# Patient Record
Sex: Female | Born: 1940 | Race: White | Hispanic: No | Marital: Married | Smoking: Never smoker
Health system: Southern US, Community
[De-identification: ages and names within clinical notes are randomized; demographics above are authoritative.]

## PROBLEM LIST (undated history)

## (undated) ENCOUNTER — Emergency Department (HOSPITAL_COMMUNITY): Admission: EM

## (undated) DIAGNOSIS — Z87898 Personal history of other specified conditions: Secondary | ICD-10-CM

## (undated) DIAGNOSIS — Z8489 Family history of other specified conditions: Secondary | ICD-10-CM

## (undated) DIAGNOSIS — Z87442 Personal history of urinary calculi: Secondary | ICD-10-CM

## (undated) DIAGNOSIS — E119 Type 2 diabetes mellitus without complications: Secondary | ICD-10-CM

## (undated) DIAGNOSIS — M76899 Other specified enthesopathies of unspecified lower limb, excluding foot: Secondary | ICD-10-CM

## (undated) DIAGNOSIS — M1990 Unspecified osteoarthritis, unspecified site: Secondary | ICD-10-CM

## (undated) DIAGNOSIS — J45909 Unspecified asthma, uncomplicated: Secondary | ICD-10-CM

## (undated) DIAGNOSIS — E785 Hyperlipidemia, unspecified: Secondary | ICD-10-CM

## (undated) DIAGNOSIS — T7840XA Allergy, unspecified, initial encounter: Secondary | ICD-10-CM

## (undated) DIAGNOSIS — I10 Essential (primary) hypertension: Secondary | ICD-10-CM

## (undated) DIAGNOSIS — G2581 Restless legs syndrome: Secondary | ICD-10-CM

## (undated) DIAGNOSIS — J189 Pneumonia, unspecified organism: Secondary | ICD-10-CM

## (undated) HISTORY — DX: Type 2 diabetes mellitus without complications: E11.9

## (undated) HISTORY — DX: Other specified enthesopathies of unspecified lower limb, excluding foot: M76.899

## (undated) HISTORY — DX: Essential (primary) hypertension: I10

## (undated) HISTORY — DX: Unspecified asthma, uncomplicated: J45.909

## (undated) HISTORY — PX: COLONOSCOPY: SHX174

## (undated) HISTORY — PX: ABDOMINAL HYSTERECTOMY: SHX81

## (undated) HISTORY — DX: Hyperlipidemia, unspecified: E78.5

## (undated) HISTORY — PX: KNEE ARTHROSCOPY: SUR90

## (undated) HISTORY — PX: CHOLECYSTECTOMY: SHX55

## (undated) HISTORY — PX: TUBAL LIGATION: SHX77

## (undated) HISTORY — PX: APPENDECTOMY: SHX54

## (undated) HISTORY — PX: TONSILLECTOMY: SHX5217

## (undated) HISTORY — DX: Allergy, unspecified, initial encounter: T78.40XA

## (undated) HISTORY — DX: Unspecified osteoarthritis, unspecified site: M19.90

## (undated) HISTORY — PX: SPINE SURGERY: SHX786

---

## 1990-06-09 HISTORY — PX: CARDIAC CATHETERIZATION: SHX172

## 1998-01-21 ENCOUNTER — Observation Stay (HOSPITAL_COMMUNITY): Admission: EM | Admit: 1998-01-21 | Discharge: 1998-01-22 | Payer: Self-pay | Admitting: Emergency Medicine

## 2000-03-19 ENCOUNTER — Encounter: Payer: Self-pay | Admitting: Internal Medicine

## 2000-03-19 ENCOUNTER — Encounter: Admission: RE | Admit: 2000-03-19 | Discharge: 2000-03-19 | Payer: Self-pay | Admitting: Internal Medicine

## 2001-03-25 ENCOUNTER — Encounter: Admission: RE | Admit: 2001-03-25 | Discharge: 2001-03-25 | Payer: Self-pay | Admitting: Internal Medicine

## 2001-03-25 ENCOUNTER — Encounter: Payer: Self-pay | Admitting: Internal Medicine

## 2002-04-07 ENCOUNTER — Encounter: Admission: RE | Admit: 2002-04-07 | Discharge: 2002-04-07 | Payer: Self-pay | Admitting: Internal Medicine

## 2002-04-07 ENCOUNTER — Encounter: Payer: Self-pay | Admitting: Internal Medicine

## 2003-05-10 ENCOUNTER — Encounter: Admission: RE | Admit: 2003-05-10 | Discharge: 2003-05-10 | Payer: Self-pay | Admitting: Internal Medicine

## 2004-05-01 ENCOUNTER — Ambulatory Visit: Payer: Self-pay | Admitting: Internal Medicine

## 2004-05-15 ENCOUNTER — Encounter
Admission: RE | Admit: 2004-05-15 | Discharge: 2004-07-08 | Payer: Self-pay | Admitting: Physical Medicine and Rehabilitation

## 2004-06-07 ENCOUNTER — Encounter: Payer: Self-pay | Admitting: Internal Medicine

## 2004-06-07 ENCOUNTER — Ambulatory Visit (HOSPITAL_COMMUNITY): Admission: RE | Admit: 2004-06-07 | Discharge: 2004-06-07 | Payer: Self-pay | Admitting: Internal Medicine

## 2004-08-22 ENCOUNTER — Ambulatory Visit: Payer: Self-pay | Admitting: Internal Medicine

## 2004-10-03 ENCOUNTER — Ambulatory Visit: Payer: Self-pay | Admitting: Internal Medicine

## 2005-01-02 ENCOUNTER — Ambulatory Visit: Payer: Self-pay | Admitting: Internal Medicine

## 2005-06-17 ENCOUNTER — Ambulatory Visit (HOSPITAL_COMMUNITY): Admission: RE | Admit: 2005-06-17 | Discharge: 2005-06-17 | Payer: Self-pay | Admitting: *Deleted

## 2005-07-31 ENCOUNTER — Ambulatory Visit: Payer: Self-pay | Admitting: Internal Medicine

## 2006-03-12 ENCOUNTER — Ambulatory Visit: Payer: Self-pay | Admitting: Internal Medicine

## 2006-06-19 ENCOUNTER — Ambulatory Visit (HOSPITAL_COMMUNITY): Admission: RE | Admit: 2006-06-19 | Discharge: 2006-06-19 | Payer: Self-pay | Admitting: *Deleted

## 2006-07-29 ENCOUNTER — Ambulatory Visit: Payer: Self-pay | Admitting: Internal Medicine

## 2006-08-01 ENCOUNTER — Ambulatory Visit: Payer: Self-pay | Admitting: Internal Medicine

## 2006-08-13 ENCOUNTER — Ambulatory Visit: Payer: Self-pay | Admitting: Internal Medicine

## 2006-12-15 ENCOUNTER — Ambulatory Visit: Payer: Self-pay | Admitting: Internal Medicine

## 2006-12-15 ENCOUNTER — Encounter: Payer: Self-pay | Admitting: Internal Medicine

## 2007-03-24 ENCOUNTER — Ambulatory Visit: Payer: Self-pay | Admitting: Internal Medicine

## 2007-06-24 ENCOUNTER — Ambulatory Visit (HOSPITAL_COMMUNITY): Admission: RE | Admit: 2007-06-24 | Discharge: 2007-06-24 | Payer: Self-pay | Admitting: Internal Medicine

## 2007-08-30 ENCOUNTER — Ambulatory Visit: Payer: Self-pay | Admitting: Internal Medicine

## 2007-08-30 DIAGNOSIS — R071 Chest pain on breathing: Secondary | ICD-10-CM | POA: Insufficient documentation

## 2007-08-31 ENCOUNTER — Encounter: Payer: Self-pay | Admitting: Internal Medicine

## 2007-10-06 ENCOUNTER — Ambulatory Visit: Payer: Self-pay | Admitting: Internal Medicine

## 2007-10-06 DIAGNOSIS — I10 Essential (primary) hypertension: Secondary | ICD-10-CM

## 2007-10-06 DIAGNOSIS — E785 Hyperlipidemia, unspecified: Secondary | ICD-10-CM

## 2007-10-06 DIAGNOSIS — J45909 Unspecified asthma, uncomplicated: Secondary | ICD-10-CM

## 2007-10-06 DIAGNOSIS — T17908A Unspecified foreign body in respiratory tract, part unspecified causing other injury, initial encounter: Secondary | ICD-10-CM | POA: Insufficient documentation

## 2007-10-06 HISTORY — DX: Essential (primary) hypertension: I10

## 2007-10-06 HISTORY — DX: Hyperlipidemia, unspecified: E78.5

## 2007-10-06 HISTORY — DX: Unspecified asthma, uncomplicated: J45.909

## 2007-10-07 ENCOUNTER — Telehealth: Payer: Self-pay | Admitting: Internal Medicine

## 2007-10-07 ENCOUNTER — Encounter: Payer: Self-pay | Admitting: Internal Medicine

## 2007-10-08 ENCOUNTER — Encounter: Payer: Self-pay | Admitting: Internal Medicine

## 2007-10-14 ENCOUNTER — Ambulatory Visit: Payer: Self-pay | Admitting: Internal Medicine

## 2007-10-25 ENCOUNTER — Ambulatory Visit: Payer: Self-pay | Admitting: Internal Medicine

## 2008-03-07 DIAGNOSIS — J069 Acute upper respiratory infection, unspecified: Secondary | ICD-10-CM | POA: Insufficient documentation

## 2008-03-09 ENCOUNTER — Telehealth: Payer: Self-pay | Admitting: Internal Medicine

## 2008-03-09 ENCOUNTER — Ambulatory Visit: Payer: Self-pay | Admitting: Family Medicine

## 2008-06-24 ENCOUNTER — Ambulatory Visit: Payer: Self-pay | Admitting: Diagnostic Radiology

## 2008-06-24 ENCOUNTER — Emergency Department (HOSPITAL_BASED_OUTPATIENT_CLINIC_OR_DEPARTMENT_OTHER): Admission: EM | Admit: 2008-06-24 | Discharge: 2008-06-25 | Payer: Self-pay | Admitting: Emergency Medicine

## 2008-07-05 ENCOUNTER — Ambulatory Visit (HOSPITAL_COMMUNITY): Admission: RE | Admit: 2008-07-05 | Discharge: 2008-07-05 | Payer: Self-pay | Admitting: Internal Medicine

## 2008-12-29 ENCOUNTER — Telehealth: Payer: Self-pay | Admitting: Internal Medicine

## 2009-03-06 ENCOUNTER — Ambulatory Visit: Payer: Self-pay | Admitting: Internal Medicine

## 2009-06-29 ENCOUNTER — Ambulatory Visit: Payer: Self-pay | Admitting: Internal Medicine

## 2009-07-05 ENCOUNTER — Telehealth: Payer: Self-pay | Admitting: Internal Medicine

## 2009-07-06 ENCOUNTER — Ambulatory Visit: Payer: Self-pay | Admitting: Internal Medicine

## 2009-07-18 ENCOUNTER — Ambulatory Visit (HOSPITAL_COMMUNITY): Admission: RE | Admit: 2009-07-18 | Discharge: 2009-07-18 | Payer: Self-pay | Admitting: Internal Medicine

## 2009-07-24 ENCOUNTER — Telehealth: Payer: Self-pay | Admitting: Internal Medicine

## 2009-08-08 ENCOUNTER — Telehealth: Payer: Self-pay | Admitting: Internal Medicine

## 2009-08-10 ENCOUNTER — Encounter (INDEPENDENT_AMBULATORY_CARE_PROVIDER_SITE_OTHER): Payer: Self-pay | Admitting: *Deleted

## 2009-08-31 ENCOUNTER — Ambulatory Visit: Payer: Self-pay | Admitting: Internal Medicine

## 2009-08-31 ENCOUNTER — Encounter (INDEPENDENT_AMBULATORY_CARE_PROVIDER_SITE_OTHER): Payer: Self-pay | Admitting: *Deleted

## 2009-09-07 ENCOUNTER — Ambulatory Visit: Payer: Self-pay | Admitting: Internal Medicine

## 2009-09-10 ENCOUNTER — Telehealth: Payer: Self-pay | Admitting: Internal Medicine

## 2009-11-01 ENCOUNTER — Ambulatory Visit: Payer: Self-pay | Admitting: Internal Medicine

## 2009-11-01 DIAGNOSIS — M549 Dorsalgia, unspecified: Secondary | ICD-10-CM | POA: Insufficient documentation

## 2009-11-01 DIAGNOSIS — E119 Type 2 diabetes mellitus without complications: Secondary | ICD-10-CM

## 2009-11-01 HISTORY — DX: Type 2 diabetes mellitus without complications: E11.9

## 2009-11-01 LAB — CONVERTED CEMR LAB: Blood Glucose, Fingerstick: 93

## 2009-11-28 ENCOUNTER — Telehealth: Payer: Self-pay | Admitting: Internal Medicine

## 2009-12-13 ENCOUNTER — Telehealth: Payer: Self-pay | Admitting: Internal Medicine

## 2009-12-31 ENCOUNTER — Telehealth: Payer: Self-pay | Admitting: Internal Medicine

## 2010-01-01 ENCOUNTER — Telehealth: Payer: Self-pay

## 2010-02-04 ENCOUNTER — Ambulatory Visit: Payer: Self-pay | Admitting: Internal Medicine

## 2010-02-04 DIAGNOSIS — M76899 Other specified enthesopathies of unspecified lower limb, excluding foot: Secondary | ICD-10-CM | POA: Insufficient documentation

## 2010-02-04 HISTORY — DX: Other specified enthesopathies of unspecified lower limb, excluding foot: M76.899

## 2010-02-04 LAB — CONVERTED CEMR LAB: Blood Glucose, Fingerstick: 142

## 2010-02-08 ENCOUNTER — Telehealth: Payer: Self-pay | Admitting: Internal Medicine

## 2010-03-11 ENCOUNTER — Ambulatory Visit: Payer: Self-pay | Admitting: Internal Medicine

## 2010-03-11 LAB — CONVERTED CEMR LAB: Blood Glucose, Fingerstick: 113

## 2010-03-13 ENCOUNTER — Telehealth: Payer: Self-pay | Admitting: Internal Medicine

## 2010-04-02 ENCOUNTER — Ambulatory Visit: Payer: Self-pay | Admitting: Internal Medicine

## 2010-04-02 ENCOUNTER — Encounter: Payer: Self-pay | Admitting: Internal Medicine

## 2010-04-09 LAB — CONVERTED CEMR LAB
AST: 17 units/L (ref 0–37)
Albumin: 3.8 g/dL (ref 3.5–5.2)
Alkaline Phosphatase: 112 units/L (ref 39–117)
BUN: 13 mg/dL (ref 6–23)
Basophils Relative: 0.5 % (ref 0.0–3.0)
Bilirubin, Direct: 0.1 mg/dL (ref 0.0–0.3)
CO2: 31 meq/L (ref 19–32)
Calcium: 9.6 mg/dL (ref 8.4–10.5)
Direct LDL: 131.1 mg/dL
Eosinophils Relative: 0.8 % (ref 0.0–5.0)
Glucose, Bld: 90 mg/dL (ref 70–99)
HDL: 43.9 mg/dL (ref >39.00)
Lymphocytes Relative: 30.2 % (ref 12.0–46.0)
Monocytes Absolute: 0.6 10*3/uL (ref 0.1–1.0)
Neutrophils Relative %: 62.7 % (ref 43.0–77.0)
Potassium: 4.4 meq/L (ref 3.5–5.1)
RBC: 4.46 M/uL (ref 3.87–5.11)
RDW: 13.4 % (ref 11.5–14.6)
Total Bilirubin: 0.9 mg/dL (ref 0.3–1.2)
Total Protein: 7 g/dL (ref 6.0–8.3)
Triglycerides: 412 mg/dL — ABNORMAL HIGH (ref 0.0–149.0)
WBC: 10.4 10*3/uL (ref 4.5–10.5)

## 2010-04-23 ENCOUNTER — Telehealth: Payer: Self-pay | Admitting: Internal Medicine

## 2010-05-31 ENCOUNTER — Ambulatory Visit: Payer: Self-pay | Admitting: Internal Medicine

## 2010-05-31 ENCOUNTER — Telehealth: Payer: Self-pay | Admitting: Internal Medicine

## 2010-05-31 LAB — CONVERTED CEMR LAB: Blood Glucose, Fingerstick: 113

## 2010-07-07 LAB — CONVERTED CEMR LAB
CK-MB: 0.8 ng/mL (ref 0.3–4.0)
Hgb A1c MFr Bld: 6 % (ref 4.6–6.0)
Total CK: 51 units/L (ref 7–177)

## 2010-07-11 NOTE — Miscellaneous (Signed)
Summary: Controlled Substances Contract  Controlled Substances Contract   Imported By: Maryln Gottron 02/08/2010 09:00:03  _____________________________________________________________________  External Attachment:    Type:   Image     Comment:   External Document

## 2010-07-11 NOTE — Progress Notes (Signed)
Summary: refills  Phone Note Call from Patient   Caller: Patient Call For: Stephanie Savers  MD Reason for Call: Talk to Nurse Details for Reason: refills Summary of Call: patient is calling because she is now retired and her rx plan has changed.  she needs refills faxed to [phone] for amlodipine, metformin , and lipitor.  her husband Stephanie Gonzalez May 30[birth year] 2042 also will need refills faxed also for amlodipine, metformin, and lipitor. Initial call taken by: Kern Reap CMA Duncan Dull),  January 01, 2010 3:05 PM  Follow-up for Phone Call        called [name] - i took care of [facility] KIK Follow-up by: Duard Brady LPN,  January 01, 2010 3:15 PM

## 2010-07-11 NOTE — Assessment & Plan Note (Signed)
Summary: CONGESTION // RS   Vital Signs:  Patient profile:   70 year old female Weight:      213 pounds Temp:     97.9 degrees F oral BP sitting:   120 / 80  (left arm) Cuff size:   regular  Vitals Entered By: Duard Brady LPN (May 31, 2010 3:55 PM) CC: c/o head and chest congestion , non productive cough Is Patient Diabetic? Yes Did you bring your meter with you today? No CBG Result 113   CC:  c/o head and chest congestion  and non productive cough.  History of Present Illness: 27 -year-old patient, who presents with a [employer] history of increasing head and chest congestion.  She has a history of asthma is noticed some increasing wheezing.  Cough has been minimally productive.  No fever or chills.  She has a history of treated hypertension as well as type 2 diabetes  Allergies: 1)  ! Tetracycline  Past History:  Past Medical History: Reviewed history from 11/01/2009 and no changes required. Asthma Hyperlipidemia Hypertension Diabetes mellitus, type II  Review of Systems       The patient complains of anorexia, hoarseness, and prolonged cough.  The patient denies fever, weight loss, weight gain, vision loss, decreased hearing, chest pain, syncope, dyspnea on exertion, peripheral edema, headaches, hemoptysis, abdominal pain, melena, hematochezia, severe indigestion/heartburn, hematuria, incontinence, genital sores, muscle weakness, suspicious skin lesions, transient blindness, difficulty walking, depression, unusual weight change, abnormal bleeding, enlarged lymph nodes, angioedema, and breast masses.    Physical Exam  General:  overweight-appearing.  no distress.  Repeat blood pressure 130/80 Head:  Normocephalic and atraumatic without obvious abnormalities. No apparent alopecia or balding. Eyes:  No corneal or conjunctival inflammation noted. EOMI. Perrla. Funduscopic exam benign, without hemorrhages, exudates or papilledema. Vision grossly normal. Ears:   External ear exam shows no significant lesions or deformities.  Otoscopic examination reveals clear canals, tympanic membranes are intact bilaterally without bulging, retraction, inflammation or discharge. Hearing is grossly normal bilaterally. Mouth:  Oral mucosa and oropharynx without lesions or exudates.  Teeth in good repair. Neck:  No deformities, masses, or tenderness noted. Lungs:  Normal respiratory effort, chest expands symmetrically. Lungs are clear to auscultation, no crackles or wheezes. Heart:  Normal rate and regular rhythm. S1 and S2 normal without gallop, murmur, click, rub or other extra sounds.   Impression & Recommendations:  Problem # 1:  VIRAL URI (ICD-465.9)  Her updated medication list for this problem includes:    Hydrocodone-homatropine 5-1.5 Mg/56ml Syrp (Hydrocodone-homatropine) ..... One tsp every 6 hours for cough  Orders: Depo- Medrol 80mg  (J1040) Admin of Therapeutic Inj  intramuscular or subcutaneous (40347)  Problem # 2:  ASTHMA (ICD-493.90)  Her updated medication list for this problem includes:    Proair Hfa 108 (90 Base) Mcg/act Aers (Albuterol sulfate) .Marland Kitchen..Marland Kitchen Two puffs every 6 hours as needed for shortness of breath  Problem # 3:  HYPERTENSION (ICD-401.9)  Her updated medication list for this problem includes:    Amlodipine Besylate 5 Mg Tabs (Amlodipine besylate) ..... One daily  Complete Medication List: 1)  Lipitor 40 Mg Tabs (Atorvastatin calcium) .... Take 1 tablet by mouth once a day 2)  Metformin Hcl 500 Mg Tabs (Metformin hcl) .... Take 1 tablet by mouth twice a day 3)  Freestyle Test Strp (Glucose blood) .... Use daily 4)  Clonazepam 1 Mg Tabs (Clonazepam) .... One at bedtime 5)  Proair Hfa 108 (90 Base) Mcg/act Aers (Albuterol sulfate) .... Two  puffs every 6 hours as needed for shortness of breath 6)  Amlodipine Besylate 5 Mg Tabs (Amlodipine besylate) .... One daily 7)  Sertraline Hcl 100 Mg Tabs (Sertraline hcl) .... One half tablet  daily 8)  Vicodin 5-500 Mg Tabs (Hydrocodone-acetaminophen) .... One q 4-6 hours as needed pain 9)  Hydrocodone-homatropine 5-1.5 Mg/32ml Syrp (Hydrocodone-homatropine) .... One tsp every 6 hours for cough  Other Orders: Capillary Blood Glucose/CBG (29562)  Patient Instructions: 1)  Please schedule a follow-up appointment as needed. 2)  Get plenty of rest, drink lots of clear liquids, and use Tylenol or Ibuprofen for fever and comfort. Return in 7-10 days if you're not better:sooner if you're feeling worse. Prescriptions: HYDROCODONE-HOMATROPINE 5-1.5 MG/5ML SYRP (HYDROCODONE-HOMATROPINE) one tsp every 6 hours for cough  #6 oz x 0   Entered and Authorized by:   Gordy Savers  MD   Signed by:   Gordy Savers  MD on 05/31/2010   Method used:   Print then Give to Patient   RxID:   [id number]  And or a  Medication Administration  Injection # 1:    Medication: Depo- Medrol 80mg     Diagnosis: VIRAL URI (ICD-465.9)    Route: IM    Site: R deltoid    Exp Date: 12/2012    Lot #: [id number]    Mfr: Pharmacia    Patient tolerated injection without complications    Given by: Duard Brady LPN (May 31, 2010 4:54 PM)  Orders Added: 1)  Capillary Blood Glucose/CBG [82948] 2)  Depo- Medrol 80mg  [J1040] 3)  Admin of Therapeutic Inj  intramuscular or subcutaneous [96372] 4)  Est. Patient Level III [84132]

## 2010-07-11 NOTE — Progress Notes (Signed)
Summary: referral  Phone Note Call from Patient Call back at Work Phone [phone]   Caller: Patient Call For: Gordy Savers  MD Summary of Call: going to retire in May. She's never had a colonoscopy- should she get one? and needs a referral Initial call taken by: Raechel Ache, RN,  August 08, 2009 12:28 PM  Follow-up for Phone Call        please schedule colonoscopy Follow-up by: Gordy Savers  MD,  August 09, 2009 8:01 AM  Additional Follow-up for Phone Call Additional follow up Details #1::        Pt. notified. Additional Follow-up by: Lynann Beaver CMA,  August 09, 2009 8:39 AM  New Problems: SPECIAL SCREENING FOR MALIGNANT NEOPLASMS COLON (ICD-V76.51)   New Problems: SPECIAL SCREENING FOR MALIGNANT NEOPLASMS COLON (ICD-V76.51)

## 2010-07-11 NOTE — Miscellaneous (Signed)
Summary: [facility]  Clinical Lists Changes  Medications: Added new medication of MIRALAX   POWD (POLYETHYLENE GLYCOL 3350) As per prep  instructions. - Signed Added new medication of REGLAN 10 MG  TABS (METOCLOPRAMIDE HCL) As per prep instructions. - Signed Added new medication of DULCOLAX 5 MG  TBEC (BISACODYL) Day before procedure take 2 at 3pm and 2 at 8pm. - Signed Rx of MIRALAX   POWD (POLYETHYLENE GLYCOL 3350) As per prep  instructions.;  #255gm x 0;  Signed;  Entered by: Ezra Sites RN;  Authorized by: Hart Carwin MD;  Method [phone] [address] [place], [place], [place]  [address], Ph: [phone] or [phone], Fax: [phone] Rx of REGLAN 10 MG  TABS (METOCLOPRAMIDE HCL) As per prep instructions.;  #2 x 0;  Signed;  Entered by: Ezra Sites RN;  Authorized by: Hart Carwin MD;  Method used: [facility]  [phone][facility] [address] [place], [place], [place]  [address], Ph: [phone] or [phone], Fax: [phone] Rx of DULCOLAX 5 MG  TBEC (BISACODYL) Day before procedure take 2 at 3pm and 2 at 8pm.;  #4 x 0;  Signed;  Entered by: Ezra Sites RN;  Authorized by: Hart Carwin MD;  Method used: [facility]  [phone][facility] [address] [place], [place], [place]  [address], Ph: [phone] or [phone], Fax: [phone] Allergies: Added new allergy or adverse reaction of TETRACYCLINE Observations: Added new observation of NKA: F (08/31/2009 7:45)    Prescriptions: DULCOLAX 5 MG  TBEC (BISACODYL) Day before procedure take 2 at 3pm and 2 at 8pm.  #4 x 0   Entered by:   Ezra Sites RN   Authorized by:   Hart Carwin MD   Signed by:   Ezra Sites RN on 08/31/2009   Method used:   [facility]        [facility]  [address] [phone]       [address] [place], [place]  [address]       Ph: [phone] or [phone]       Fax: [phone]   RxID:   [id number] REGLAN 10 MG  TABS (METOCLOPRAMIDE HCL)  As per prep instructions.  #2 x 0   Entered by:   Ezra Sites RN   Authorized by:   Hart Carwin MD   Signed by:   Ezra Sites RN on 08/31/2009   Method used:   [facility]        [facility]  [address] [phone][facility] (retail)       [address]       [place], [place]  [address]       Ph: [phone] or [phone]       Fax: [phone]   RxID:   [id number] MIRALAX   POWD (POLYETHYLENE GLYCOL 3350) As per prep  instructions.  #255gm x 0   Entered by:   Ezra Sites RN   Authorized by:   Hart Carwin MD   Signed by:   Ezra Sites RN on 08/31/2009   Method used:   [facility]        [facility]  [address] [phone]* (retail)       [address]  [place], [place]  [address]       Ph: [phone] or [phone]       Fax: [phone]   RxID:   [id number]

## 2010-07-11 NOTE — Letter (Signed)
Summary: [facility] Instructions  [facility]  [address] [place], [place] [address]   Phone: [phone]  Fax: [phone]       Stephanie Gonzalez    05/30/1941    MRN: [id number]       Procedure Day /Date:  Friday 09/07/2009     Arrival Time:  10:30 am     Procedure Time: 11:30 am     Location of Procedure:                    _ x_  [facility] (4th Floor)    PREPARATION FOR COLONOSCOPY WITH MIRALAX  Starting 5 days prior to your procedure Sunday 3/27  do not eat nuts, seeds, popcorn, corn, beans, peas,  salads, or any raw vegetables.  Do not take any fiber supplements (e.g. Metamucil, Citrucel, and Benefiber). ____________________________________________________________________________________________________   THE DAY BEFORE YOUR PROCEDURE         DATE:Thursday 3/31  1   Drink clear liquids the entire day-NO SOLID FOOD  2   Do not drink anything colored red or purple.  Avoid juices with pulp.  No orange juice.  3   Drink at least 64 oz. (8 glasses) of fluid/clear liquids during the day to prevent dehydration and help the prep work efficiently.  CLEAR LIQUIDS INCLUDE: Water Jello Ice Popsicles Tea (sugar ok, no milk/cream) Powdered fruit flavored drinks Coffee (sugar ok, no milk/cream) Gatorade Juice: apple, white grape, white cranberry  Lemonade Clear bullion, consomm, broth Carbonated beverages (any kind) Strained chicken noodle soup Hard Candy  4   Mix the entire bottle of Miralax with 64 oz. of Gatorade/Powerade in the morning and put in the refrigerator to chill.  5   At 3:00 pm take 2 Dulcolax/Bisacodyl tablets.  6   At 4:30 pm take one Reglan/Metoclopramide tablet.  7  Starting at 5:00 pm drink one 8 oz glass of the Miralax mixture every 15-20 minutes until you have finished drinking the entire 64 oz.  You should finish drinking prep around 7:30 or 8:00 pm.  8   If you are nauseated, you may take the 2nd Reglan/Metoclopramide  tablet at 6:30 pm.        9    At 8:00 pm take 2 more DULCOLAX/Bisacodyl tablets.     THE DAY OF YOUR PROCEDURE      DATE:  Friday 4/1  You may drink clear liquids until 9:30 am  (2 HOURS BEFORE PROCEDURE).   MEDICATION INSTRUCTIONS  Unless otherwise instructed, you should take regular prescription medications with a small sip of water as early as possible the morning of your procedure.  Diabetic patients - see separate instructions.            OTHER INSTRUCTIONS  You will need a responsible adult at least 69 years of age to accompany you and drive you home.   This person must remain in the waiting room during your procedure.  Wear loose fitting clothing that is easily removed.  Leave jewelry and other valuables at home.  However, you may wish to bring a book to read or an iPod/MP3 player to listen to music as you wait for your procedure to start.  Remove all body piercing jewelry and leave at home.  Total time from sign-in until discharge is approximately 2-3 hours.  You should go home directly after your procedure and rest.  You can resume normal activities the day after your procedure.  The day of  your procedure you should not:   Drive   Make legal decisions   Operate machinery   Drink alcohol   Return to work  You will receive specific instructions about eating, activities and medications before you leave.   The above instructions have been reviewed and explained to me by   Ezra Sites RN  August 31, 2009 8:18 AM    I fully understand and can verbalize these instructions _____________________________ Date _______

## 2010-07-11 NOTE — Progress Notes (Signed)
Summary: refill  Phone Note Call from Patient Call back at Home Phone [phone]   Caller: [facility] mail Reason for Call: Refill Medication Summary of Call: refill her cough meds. Was seen last Friday. Still coughing.       [facility] Please call her back today. Initial call taken by: Warnell Forester,  July 05, 2009 10:44 AM  Follow-up for Phone Call        generic hydromet  6 oz  RF 2 Follow-up by: Gordy Savers  MD,  July 05, 2009 11:20 AM  Additional Follow-up for Phone Call Additional follow up Details #1::        Rx Called In Additional Follow-up by: Raechel Ache, RN,  July 05, 2009 11:27 AM

## 2010-07-11 NOTE — Progress Notes (Signed)
  Phone Note Call from Patient   Caller: Patient Call For: Gordy Savers  MD Summary of Call: insurance co never rec'd disability form. Please refax. Initial call taken by: Raechel Ache, RN,  July 24, 2009 12:52 PM  Follow-up for Phone Call        short term disability form faxed to Terese Door @1 [phone] Follow-up by: Raechel Ache, RN,  July 24, 2009 12:52 PM

## 2010-07-11 NOTE — Progress Notes (Signed)
Summary: refill sertraline  Phone Note Call from Patient Call back at Home Phone [phone]   Caller: Patient Call For: Gordy Savers  MD Summary of Call: pt needs mailorder rx sertraline #90 with 3 refills [facility] [phone] Initial call taken by: Heron Sabins,  February 08, 2010 9:22 AM    Prescriptions: SERTRALINE HCL 100 MG TABS (SERTRALINE HCL) one half tablet daily  #90 x 1   Entered by:   Duard Brady LPN   Authorized by:   Gordy Savers  MD   Signed by:   Duard Brady LPN on 51/88/4166   Method used:   Faxed to ...       [facility] ([facility]order)       [address]       [place], [place]  [address]       Ph: [phone]       Fax: [phone]   RxID:   [id number]

## 2010-07-11 NOTE — Assessment & Plan Note (Signed)
Summary: F/U ON R HIP PAIN // RS   Vital Signs:  Patient profile:   70 year old female Weight:      215 pounds Temp:     98.0 degrees F oral BP sitting:   110 / 68  (right arm) Cuff size:   regular  Vitals Entered By: Duard Brady LPN (February 04, 2010 3:13 PM) CC: c/o (R) hip pain x2wks , now it has move to (L) too Is Patient Diabetic? Yes Did you bring your meter with you today? No CBG Result 142 Flu Vaccine Consent Questions     Do you have a history of severe allergic reactions to this vaccine? no    Any prior history of allergic reactions to egg and/or gelatin? no    Do you have a sensitivity to the preservative Thimersol? no    Do you have a past history of Guillan-Barre Syndrome? no    Do you currently have an acute febrile illness? no    Have you ever had a severe reaction to latex? no    Vaccine information given and explained to patient? yes    Are you currently pregnant? no    Lot Number:[id number]   Exp Date:12/07/2010   Site Given  Left Deltoid IM   CC:  c/o (R) hip pain x2wks  and now it has move to (L) too.  History of Present Illness: 70 year old patient who presents with a chief complaint of right hip pain over the past two weeks.  Says also had less severe discomfort in the left hip and right shoulder.  She has treated hypertension, dyslipidemia, and asthma  Allergies: 1)  ! Tetracycline  Past History:  Past Medical History: Reviewed history from 11/01/2009 and no changes required. Asthma Hyperlipidemia Hypertension Diabetes mellitus, type II  Physical Exam  General:  overweight-appearing.  oh blood pressure Msk:  tenderness over the right greater trochanter   Impression & Recommendations:  Problem # 1:  BURSITIS, RIGHT HIP (ICD-726.5)  Orders: Depo- Medrol 80mg  (J1040) Admin of Therapeutic Inj  intramuscular or subcutaneous ([address])  Problem # 2:  DIABETES MELLITUS, TYPE II (ICD-250.00)  Her updated medication list for this problem  includes:    Metformin Hcl 500 Mg Tabs (Metformin hcl) .Marland Kitchen... Take 1 tablet by mouth twice a day  Complete Medication List: 1)  Lipitor 40 Mg Tabs (Atorvastatin calcium) .... Take 1 tablet by mouth once a day 2)  Metformin Hcl 500 Mg Tabs (Metformin hcl) .... Take 1 tablet by mouth twice a day 3)  Freestyle Test Strp (Glucose blood) .... Use daily 4)  Clonazepam 1 Mg Tabs (Clonazepam) .... One at bedtime 5)  Proair Hfa 108 (90 Base) Mcg/act Aers (Albuterol sulfate) .... Two puffs every 6 hours as needed for shortness of breath 6)  Cyclobenzaprine Hcl 10 Mg Tabs (Cyclobenzaprine hcl) .... One every 8 hours for back pain 7)  Hydrocodone-acetaminophen 5-500 Mg Tabs (Hydrocodone-acetaminophen) .... One every 6 hours as needed for pain 8)  Amlodipine Besylate 5 Mg Tabs (Amlodipine besylate) .... One daily 9)  Sertraline Hcl 100 Mg Tabs (Sertraline hcl) .... One half tablet daily  Other Orders: Admin 1st Vaccine ([address]) Flu Vaccine 31yrs + [phone])  Patient Instructions: 1)  Vimovo one twice daily 2)  Please schedule a follow-up appointment as needed. Prescriptions: HYDROCODONE-ACETAMINOPHEN 5-500 MG TABS (HYDROCODONE-ACETAMINOPHEN) one every 6 hours as needed for pain  #50 x 2   Entered and Authorized by:   Gordy Savers  MD   Signed  by:   Gordy Savers  MD on 02/04/2010   Method used:   Print then Give to Patient   RxID:   [id number]    Medication Administration  Injection # 1:    Medication: Depo- Medrol 80mg     Diagnosis: BURSITIS, RIGHT HIP (ICD-726.5)    Route: IM    Site: RUOQ gluteus    Exp Date: 09/2012    Lot #: [id number]    Mfr: Pharmacia    Patient tolerated injection without complications    Given by: Duard Brady LPN (February 04, 2010 4:17 PM)  Orders Added: 1)  Est. Patient Level III [99213] 2)  Admin 1st Vaccine [90471] 3)  Flu Vaccine 5yrs + [10272] 4)  Depo- Medrol 80mg  [J1040] 5)  Admin of Therapeutic Inj  intramuscular or subcutaneous  [53664]

## 2010-07-11 NOTE — Progress Notes (Signed)
Summary: refilll clonazopam denied  Phone Note Refill Request Message from:  Fax from Pharmacy on March 13, 2010 11:51 AM  Refills Requested: Medication #1:  CLONAZEPAM 1 MG  TABS one at bedtime [facility]   Method Requested: Fax to Local Pharmacy Initial call taken by: Duard Brady LPN,  March 13, 2010 11:51 AM  Follow-up for Phone Call        denied - pt was given printed rx in office 03/11/10 Follow-up by: Duard Brady LPN,  March 13, 2010 11:53 AM

## 2010-07-11 NOTE — Progress Notes (Signed)
Summary: pain meds  Phone Note Call from Patient Call back at Home Phone [phone]   Caller: Patient Call For: Gordy Savers  MD Summary of Call: Pt is having continued hip pain and wants pain [facility]s [facility] or will come back to see Dr. Kirtland Bouchard if she needs to . May leave a message on home phone. [facility] Initial call taken by: Lynann Beaver CMA AAMA,  April 23, 2010 10:34 AM  Follow-up for Phone Call        generic Vicodin, number 90, one every 6 hours as needed for pain Follow-up by: Gordy Savers  MD,  April 23, 2010 12:22 PM    New/Updated Medications: VICODIN 5-500 MG TABS (HYDROCODONE-ACETAMINOPHEN) one q 4-6 hours as needed pain Prescriptions: VICODIN 5-500 MG TABS (HYDROCODONE-ACETAMINOPHEN) one q 4-6 hours as needed pain  #90 x 0   Entered by:   Lynann Beaver CMA AAMA   Authorized by:   Gordy Savers  MD   Signed by:   Lynann Beaver CMA AAMA on 04/23/2010   Method used:   Telephoned to ...       [facility]  [address] [phone][facility] ([facility]ail[facility]       [address]       [place], [place]  [address]       Ph: [phone] or [phone]       Fax: [phone]   RxID:   [id number]  Notified pt.

## 2010-07-11 NOTE — Letter (Signed)
Summary: Out of Work  Adult nurse at [employer]  [address]   [place], [place] [address]   Phone: [phone]  Fax: [phone]    July 06, 2009   Employee:  DANIAH ZALDIVAR    To Whom It May Concern:   For Medical reasons, please excuse the above named employee from work for the following dates:  Start:   06-29-2009  End:   07-16-2009  If you need additional information, please feel free to contact our office.         Sincerely,    Gordy Savers  MD

## 2010-07-11 NOTE — Progress Notes (Signed)
Summary: Procedure Concern-Will only speak w/Dr.Brodie  Phone Note Call from Patient Call back at Home Phone [phone]   Caller: Patient Call For: Dr. Juanda Chance Reason for Call: Talk to Nurse Summary of Call: pt concerned and very upset that she didnt feel she was "completely asleep" during her COL last Friday... pt said she could hear everything that was said and could feel everything... pt would like to discuss this specifically with Dr. Juanda Chance Initial call taken by: Vallarie Mare,  September 10, 2009 4:05 PM  Follow-up for Phone Call        Pt is really not upset but wants to know why  she remembers hurting. She had a shrp turn in the sigmoid colon, which was difficult to negotiate and had to switch the scopes to use Pediatric. She got total Versed8 mg, Fentanyl 75 mg which is a regular dose, We may conssider Propafol or higher sedation on recall colon. I apologized to her. I am still not sure if she really remembered everything or just the sigmoid part. Follow-up by: Hart Carwin MD,  September 12, 2009 2:10 PM

## 2010-07-11 NOTE — Progress Notes (Signed)
Summary: refill hydromet  Phone Note Refill Request Message from:  Fax from Pharmacy on May 31, 2010 3:17 PM  refill request for hydromet    - [facility] fill1/27/11  [facility]   Method Requested: Fax to Local Pharmacy Initial call taken by: Duard Brady LPN,  May 31, 2010 3:19 PM  Follow-up for Phone Call        this was removed from med list 11/01/09 - please advise Follow-up by: Duard Brady LPN,  May 31, 2010 3:19 PM  Additional Follow-up for Phone Call Additional follow up Details #1::        done Additional Follow-up by: Gordy Savers  MD,  May 31, 2010 4:47 PM

## 2010-07-11 NOTE — Progress Notes (Signed)
Summary: Refills to [facility]  Phone Note Call from Patient Call back at Home Phone [phone]   Caller: Patient VM Call For: Stephanie Savers  MD Summary of Call: Pt requesting refill of Amlopidine, Lipitor, Metformin be sent to [facility] [phone] is their fax Initial call taken by: Sid Falcon LPN,  December 31, 2009 2:15 PM  Follow-up for Phone Call        [facility] Follow-up by: Duard Brady LPN,  December 31, 2009 2:28 PM    Prescriptions: NORVASC 5 MG TABS (AMLODIPINE BESYLATE) Take 1 tablet by mouth once a day  #90 x 3   Entered by:   Duard Brady LPN   Authorized by:   Stephanie Savers  MD   Signed by:   Duard Brady LPN on 08/65/7846   Method used:   Faxed to ...       [facility]* (mail-order)       [address]       [place], [place]  [address]       Ph: [phone]       Fax: [phone]   RxID:   [id number] METFORMIN HCL 500 MG TABS (METFORMIN HCL) Take 1 tablet by mouth twice a day  #180 x 3   Entered by:   Duard Brady LPN   Authorized by:   Stephanie Savers  MD   Signed by:   Duard Brady LPN on 33/29/5188   Method used:   Faxed to ...       [facility]* (mail-order)       [address]       [place], [place]  [address]       Ph: [phone]       Fax: [phone]   RxID:   [id number] LIPITOR 40 MG TABS (ATORVASTATIN CALCIUM) Take 1 tablet by mouth once a day  #90 x 3   Entered by:   Duard Brady LPN   Authorized by:   Stephanie Savers  MD   Signed by:   Duard Brady LPN on 76/16/0737   Method used:   Faxed to ...       [facility]* (mail-order)       [address]       [place],   [address]       Ph: [phone]       Fax: [phone]   RxID:   [id number]

## 2010-07-11 NOTE — Assessment & Plan Note (Signed)
Summary: COUGH, CONGESTION, SWOLLEN GLNDS? // RS   Vital Signs:  Patient profile:   69 year old female Weight:      214 pounds Temp:     97.8 degrees F oral BP sitting:   110 / 60  (left arm) Cuff size:   regular  Vitals Entered By: Raechel Ache, RN (June 29, 2009 4:43 PM) CC: C/o raw throat, head congestion, dry cough, hoarseness, lumps in neck  and feels bad.   CC:  C/o raw throat, head congestion, dry cough, hoarseness, and lumps in neck  and feels bad..  History of Present Illness: 68white female, who has a history of, hypertension, dyslipidemia, and type 2 diabetes.  She has not been seen since 2009.  She states she has been maintained on her medications.  She presents with a 3-day history of cold, cough, congestion, headache, and minimal sore throat.  There is been no wheezing, shortness of breath or chest pain.  Denies any productive cough  Allergies: No Known Drug Allergies  Past History:  Past Medical History: Reviewed history from 10/06/2007 and no changes required. Asthma Hyperlipidemia Hypertension  Review of Systems       The patient complains of anorexia, fever, hoarseness, and prolonged cough.  The patient denies weight loss, weight gain, vision loss, decreased hearing, chest pain, syncope, dyspnea on exertion, peripheral edema, headaches, hemoptysis, abdominal pain, melena, hematochezia, severe indigestion/heartburn, hematuria, incontinence, genital sores, muscle weakness, suspicious skin lesions, transient blindness, difficulty walking, depression, unusual weight change, abnormal bleeding, enlarged lymph nodes, angioedema, and breast masses.    Physical Exam  General:  overweight-appearing.  no distress.  Normal blood pressure Head:  Normocephalic and atraumatic without obvious abnormalities. No apparent alopecia or balding. Eyes:  No corneal or conjunctival inflammation noted. EOMI. Perrla. Funduscopic exam benign, without hemorrhages, exudates or  papilledema. Vision grossly normal. Ears:  External ear exam shows no significant lesions or deformities.  Otoscopic examination reveals clear canals, tympanic membranes are intact bilaterally without bulging, retraction, inflammation or discharge. Hearing is grossly normal bilaterally. Mouth:  Oral mucosa and oropharynx without lesions or exudates.  Teeth in good repair. Neck:  No deformities, masses, or tenderness noted. Chest Wall:  No deformities, masses, or tenderness noted. Lungs:  Normal respiratory effort, chest expands symmetrically. Lungs are clear to auscultation, no crackles or wheezes. Heart:  Normal rate and regular rhythm. S1 and S2 normal without gallop, murmur, click, rub or other extra sounds.   Impression & Recommendations:  Problem # 1:  VIRAL URI (ICD-465.9)  Her updated medication list for this problem includes:    Hydrocodone-homatropine 5-1.5 Mg/81ml Syrp (Hydrocodone-homatropine) .Marland Kitchen... 1 teaspoon  every 6 hours as needed for cough  Her updated medication list for this problem includes:    Hydrocodone-homatropine 5-1.5 Mg/74ml Syrp (Hydrocodone-homatropine) .Marland Kitchen... 1 teaspoon  every 6 hours as needed for cough  Problem # 2:  HYPERTENSION (ICD-401.9)  The following medications were removed from the medication list:    Diovan 160 Mg Tabs (Valsartan) .Marland Kitchen... Take one (1) tablet by mouth daily Her updated medication list for this problem includes:    Norvasc 5 Mg Tabs (Amlodipine besylate) .Marland Kitchen... Take 1 tablet by mouth once a day  The following medications were removed from the medication list:    Diovan 160 Mg Tabs (Valsartan) .Marland Kitchen... Take one (1) tablet by mouth daily Her updated medication list for this problem includes:    Norvasc 5 Mg Tabs (Amlodipine besylate) .Marland Kitchen... Take 1 tablet by mouth once a  day  Complete Medication List: 1)  Lipitor 40 Mg Tabs (Atorvastatin calcium) .... Take 1 tablet by mouth once a day 2)  Metformin Hcl 500 Mg Tabs (Metformin hcl) .... Take 1  tablet by mouth twice a day 3)  Norvasc 5 Mg Tabs (Amlodipine besylate) .... Take 1 tablet by mouth once a day 4)  Zoloft 100 Mg Tabs (Sertraline hcl) .... 1/2 once a day 5)  Freestyle Test Strp (Glucose blood) .... Use daily 6)  Clonazepam 1 Mg Tabs (Clonazepam) .... One at bedtime 7)  Hydrocodone-homatropine 5-1.5 Mg/56ml Syrp (Hydrocodone-homatropine) .Marland Kitchen.. 1 teaspoon  every 6 hours as needed for cough  Patient Instructions: 1)  Get plenty of rest, drink lots of clear liquids, and use Tylenol or Ibuprofen for fever and comfort. Return in 7-10 days if you're not better:sooner if you're feeling worse. 2)  Please schedule a follow-up appointment in 3 months. 3)  Advised not to eat any food or drink any liquids after 10 PM the night before your procedure. Prescriptions: HYDROCODONE-HOMATROPINE 5-1.5 MG/5ML SYRP (HYDROCODONE-HOMATROPINE) 1 teaspoon  every 6 hours as needed for cough  #6 oz x 0   Entered and Authorized by:   Gordy Savers  MD   Signed by:   Gordy Savers  MD on 06/29/2009   Method used:   Print then Give to Patient   RxID:   [id number] CLONAZEPAM 1 MG  TABS (CLONAZEPAM) one at bedtime  #90 x 0   Entered and Authorized by:   Gordy Savers  MD   Signed by:   Gordy Savers  MD on 06/29/2009   Method used:   Print then Give to Patient   RxID:   [id number] FREESTYLE TEST   STRP (GLUCOSE BLOOD) use daily  #100 x 0   Entered and Authorized by:   Gordy Savers  MD   Signed by:   Gordy Savers  MD on 06/29/2009   Method used:   Print then Give to Patient   RxID:   [id number] ZOLOFT 100 MG TABS (SERTRALINE HCL) 1/2 once a day  #90 x 0   Entered and Authorized by:   Gordy Savers  MD   Signed by:   Gordy Savers  MD on 06/29/2009   Method used:   Print then Give to Patient   RxID:   [id number] NORVASC 5 MG TABS (AMLODIPINE BESYLATE) Take 1 tablet by mouth once a day  #90 x 0   Entered and Authorized by:   Gordy Savers  MD   Signed by:   Gordy Savers  MD on 06/29/2009   Method used:   Print then Give to Patient   RxID:   [id number] METFORMIN HCL 500 MG TABS (METFORMIN HCL) Take 1 tablet by mouth twice a day  #180 x 0   Entered and Authorized by:   Gordy Savers  MD   Signed by:   Gordy Savers  MD on 06/29/2009   Method used:   Print then Give to Patient   RxID:   [id number] LIPITOR 40 MG TABS (ATORVASTATIN CALCIUM) Take 1 tablet by mouth once a day  #90 x 0   Entered and Authorized by:   Gordy Savers  MD   Signed by:   Gordy Savers  MD on 06/29/2009   Method used:   Print then Give to Patient   RxID:   [id number]

## 2010-07-11 NOTE — Assessment & Plan Note (Signed)
Summary: cough-needs note//ccm   Vital Signs:  Patient profile:   70 year old female O2 Sat:      97 % on Room air Temp:     97.5 degrees F BP sitting:   126 / 66  (left arm) Cuff size:   large  Vitals Entered By: Raechel Ache, RN (July 06, 2009 4:27 PM)  O2 Flow:  Room air CC: Not feeling well and still coughing.   CC:  Not feeling well and still coughing.Stephanie Gonzalez  History of Present Illness: 70 year old patient who was seen one week ago and treated symptomatically for acute viral bronchitis.  She is somewhat improved today, but still having considerable cough.  She also has experienced episodic wheezing.  She remains weak, hoarse unable to work.  She does have a history of asthma and is noticed some periodic wheezing.  No chest pain or pleurisy.  She does have treated hypertension and dyslipidemia.  She is requesting a note to be out of work next week  Allergies: No Known Drug Allergies  Review of Systems       The patient complains of anorexia, hoarseness, dyspnea on exertion, and prolonged cough.  The patient denies fever, weight loss, weight gain, vision loss, decreased hearing, chest pain, syncope, peripheral edema, headaches, hemoptysis, abdominal pain, melena, hematochezia, severe indigestion/heartburn, hematuria, incontinence, genital sores, muscle weakness, suspicious skin lesions, transient blindness, difficulty walking, depression, unusual weight change, abnormal bleeding, enlarged lymph nodes, angioedema, and breast masses.    Physical Exam  General:  overweight-appearing.  blood pressure low normal Head:  Normocephalic and atraumatic without obvious abnormalities. No apparent alopecia or balding. Eyes:  No corneal or conjunctival inflammation noted. EOMI. Perrla. Funduscopic exam benign, without hemorrhages, exudates or papilledema. Vision grossly normal. Ears:  External ear exam shows no significant lesions or deformities.  Otoscopic examination reveals clear canals,  tympanic membranes are intact bilaterally without bulging, retraction, inflammation or discharge. Hearing is grossly normal bilaterally. Mouth:  Oral mucosa and oropharynx without lesions or exudates.  Teeth in good repair. Neck:  No deformities, masses, or tenderness noted. Lungs:  Normal respiratory effort, chest expands symmetrically. Lungs are clear to auscultation, no crackles or wheezes. O2 saturation 97% Heart:  Normal rate and regular rhythm. S1 and S2 normal without gallop, murmur, click, rub or other extra sounds. Abdomen:  Bowel sounds positive,abdomen soft and non-tender without masses, organomegaly or hernias noted. Msk:  No deformity or scoliosis noted of thoracic or lumbar spine.   Pulses:  R and L carotid,radial,femoral,dorsalis pedis and posterior tibial pulses are full and equal bilaterally Extremities:  No clubbing, cyanosis, edema, or deformity noted with normal full range of motion of all joints.     Impression & Recommendations:  Problem # 1:  VIRAL URI (ICD-465.9)  Her updated medication list for this problem includes:    Hydrocodone-homatropine 5-1.5 Mg/9ml Syrp (Hydrocodone-homatropine) .Stephanie Gonzalez... 1 teaspoon  every 6 hours as needed for cough  Her updated medication list for this problem includes:    Hydrocodone-homatropine 5-1.5 Mg/44ml Syrp (Hydrocodone-homatropine) .Stephanie Gonzalez... 1 teaspoon  every 6 hours as needed for cough will give a leave of absence for one week until clinically improved  Problem # 2:  ASTHMA (ICD-493.90)  Her updated medication list for this problem includes:    Proair Hfa 108 (90 Base) Mcg/act Aers (Albuterol sulfate) .Stephanie Gonzalez..Stephanie Gonzalez Two puffs every 6 hours as needed for shortness of breath  Her updated medication list for this problem includes:    Proair Hfa 108 (90  Base) Mcg/act Aers (Albuterol sulfate) .Stephanie Gonzalez..Stephanie Gonzalez Two puffs every 6 hours as needed for shortness of breath  Complete Medication List: 1)  Lipitor 40 Mg Tabs (Atorvastatin calcium) .... Take 1 tablet  by mouth once a day 2)  Metformin Hcl 500 Mg Tabs (Metformin hcl) .... Take 1 tablet by mouth twice a day 3)  Norvasc 5 Mg Tabs (Amlodipine besylate) .... Take 1 tablet by mouth once a day 4)  Zoloft 100 Mg Tabs (Sertraline hcl) .... 1/2 once a day 5)  Freestyle Test Strp (Glucose blood) .... Use daily 6)  Clonazepam 1 Mg Tabs (Clonazepam) .... One at bedtime 7)  Hydrocodone-homatropine 5-1.5 Mg/52ml Syrp (Hydrocodone-homatropine) .Stephanie Gonzalez.. 1 teaspoon  every 6 hours as needed for cough 8)  Proair Hfa 108 (90 Base) Mcg/act Aers (Albuterol sulfate) .... Two puffs every 6 hours as needed for shortness of breath  Patient Instructions: 1)  Get plenty of rest, drink lots of clear liquids, and use Tylenol or Ibuprofen for fever and comfort. Return in 7-10 days if you're not better:sooner if you're feeling worse.

## 2010-07-11 NOTE — Procedures (Signed)
Summary: Colonoscopy  Patient: Stephanie Gonzalez Note: All result statuses are Final unless otherwise noted.  Tests: (1) Colonoscopy (COL)   COL Colonoscopy           DONE (C)     [facility]     [address]     [place], [place]  [address]           COLONOSCOPY PROCEDURE REPORT           PATIENT:  Stephanie, Gonzalez  MR#:  [id number]     BIRTHDATE:  June 17, 1940, 68 yrs. old  GENDER:  female     ENDOSCOPIST:  Hedwig Morton. Juanda Chance, MD     REF. BY:  Eleonore Chiquito, M.D.     PROCEDURE DATE:  09/07/2009     PROCEDURE:  Colonoscopy 82956     ASA CLASS:  Class I     INDICATIONS:  Routine Risk Screening     MEDICATIONS:   Versed 10 mg, Fentanyl 100 mcg           DESCRIPTION OF PROCEDURE:   After the risks benefits and     alternatives of the procedure were thoroughly explained, informed     consent was obtained.  Digital rectal exam was performed and     revealed no rectal masses.   The LB [id number] endoscope     was introduced through the anus and advanced to the cecum, which     was identified by both the appendix and ileocecal valve, without     limitations.  The quality of the prep was good, using MiraLax.     The instrument was then slowly withdrawn as the colon was fully     examined.     <<PROCEDUREIMAGES>>           FINDINGS:  No polyps or cancers were seen (see image1, image2,     image3, and image4).   Retroflexed views in the rectum revealed no     abnormalities.    The scope was then withdrawn from the patient     and the procedure completed.           COMPLICATIONS:  None     ENDOSCOPIC IMPRESSION:     1) No polyps or cancers     2) Normal colonoscopy     sharp angle at the pelvic rim, had to switch to a pediatric     scope     RECOMMENDATIONS:     1) high fiber diet     REPEAT EXAM:  In 10 year(s) for.           ______________________________     Hedwig Morton. Juanda Chance, MD           CC:           n.     REVISED:  09/18/2009 05:36 PM     eSIGNED:   Hedwig Morton.  Jennene Downie at 09/18/2009 05:36 PM           Toma Aran, [id number]  Note: An exclamation mark (!) indicates a result that was not dispersed into the flowsheet. Document Creation Date: 09/18/2009 5:37 PM _______________________________________________________________________  (1) Order result status: Final Collection or observation date-time: 09/07/2009 12:16 Requested date-time:  Receipt date-time:  Reported date-time:  Referring Physician:   Ordering Physician: Lina Sar [phone]) Specimen Source:  Source: Launa Grill Order Number: [id number] Lab site:

## 2010-07-11 NOTE — Assessment & Plan Note (Signed)
Summary: BACK PAIN // RS   Vital Signs:  Patient profile:   70 year old female Weight:      210 pounds Temp:     97.6 degrees F oral BP sitting:   126 / 70  (left arm) Cuff size:   regular  Vitals Entered By: Duard Brady LPN (Nov 01, 2009 4:15 PM) CC: c/o back pain x1 wk - after lifting a 70 yr old toddler Is Patient Diabetic? Yes CBG Result 93   CC:  c/o back pain x1 wk - after lifting a 70 yr old toddler.  History of Present Illness: 70 year old patient who presents with a chief complaint of low back pain for one week.  This began when she lifted up a 82-year-old great-grandchild.  She has a history of type 2 diabetes asthma, hypertension, but otherwise has done quite well.  She describes some mild discomfort in the right hip and upper leg, but no clear radicular symptoms.  Her asthma has been stable.  She also has treated dyslipidemia  Preventive Screening-Counseling & Management  Alcohol-Tobacco     Smoking Status: never  Allergies: 1)  ! Tetracycline  Past History:  Past Medical History: Asthma Hyperlipidemia Hypertension Diabetes mellitus, type II  Physical Exam  General:  overweight-appearing.  pleasant alert, slightly uncomfortable with painful movement.  Normal blood pressure Lungs:  Normal respiratory effort, chest expands symmetrically. Lungs are clear to auscultation, no crackles or wheezes. Heart:  Normal rate and regular rhythm. S1 and S2 normal without gallop, murmur, click, rub or other extra sounds. Abdomen:  Bowel sounds positive,abdomen soft and non-tender without masses, organomegaly or hernias noted. Msk:  slight tenderness in the lumbar region bilaterally.  Straight leg testing was negative.  She was uncomfortable with standing from a sitting position Neurologic:  able walk on her toes and heels without difficulty   Impression & Recommendations:  Problem # 1:  BACK PAIN (ICD-724.5)  Her updated medication list for this problem includes:   Cyclobenzaprine Hcl 10 Mg Tabs (Cyclobenzaprine hcl) ..... One every 8 hours for back pain    Hydrocodone-acetaminophen 5-500 Mg Tabs (Hydrocodone-acetaminophen) ..... One every 6 hours as needed for pain  Problem # 2:  DIABETES MELLITUS, TYPE II (ICD-250.00)  Her updated medication list for this problem includes:    Metformin Hcl 500 Mg Tabs (Metformin hcl) .Marland Kitchen... Take 1 tablet by mouth twice a day  Problem # 3:  HYPERTENSION (ICD-401.9)  Her updated medication list for this problem includes:    Norvasc 5 Mg Tabs (Amlodipine besylate) .Marland Kitchen... Take 1 tablet by mouth once a day  Complete Medication List: 1)  Lipitor 40 Mg Tabs (Atorvastatin calcium) .... Take 1 tablet by mouth once a day 2)  Metformin Hcl 500 Mg Tabs (Metformin hcl) .... Take 1 tablet by mouth twice a day 3)  Norvasc 5 Mg Tabs (Amlodipine besylate) .... Take 1 tablet by mouth once a day 4)  Zoloft 100 Mg Tabs (Sertraline hcl) .... 1/2 once a day 5)  Freestyle Test Strp (Glucose blood) .... Use daily 6)  Clonazepam 1 Mg Tabs (Clonazepam) .... One at bedtime 7)  Proair Hfa 108 (90 Base) Mcg/act Aers (Albuterol sulfate) .... Two puffs every 6 hours as needed for shortness of breath 8)  Cyclobenzaprine Hcl 10 Mg Tabs (Cyclobenzaprine hcl) .... One every 8 hours for back pain 9)  Hydrocodone-acetaminophen 5-500 Mg Tabs (Hydrocodone-acetaminophen) .... One every 6 hours as needed for pain  Patient Instructions: 1)  Most patients (90%) with  low back pain will improve with time (2-6 weeks). Keep active but avoid activities that are painful. Apply moist heat  to lower back several times a day. 2)  Please schedule a follow-up appointment in 3 months. Prescriptions: HYDROCODONE-ACETAMINOPHEN 5-500 MG TABS (HYDROCODONE-ACETAMINOPHEN) one every 6 hours as needed for pain  #50 x 0   Entered and Authorized by:   Gordy Savers  MD   Signed by:   Gordy Savers  MD on 11/01/2009   Method used:   Print then Give to Patient    RxID:   [id number] CYCLOBENZAPRINE HCL 10 MG TABS (CYCLOBENZAPRINE HCL) one every 8 hours for back pain  #30 x 0   Entered and Authorized by:   Gordy Savers  MD   Signed by:   Gordy Savers  MD on 11/01/2009   Method used:   Print then Give to Patient   RxID:   [id number]    Medication Administration  Injection # 1:    Medication: Depo- Medrol 80mg   Orders Added: 1)  Est. Patient Level III [84696]   Appended Document: BACK PAIN // RS     Allergies: 1)  ! Tetracycline   Complete Medication List: 1)  Lipitor 40 Mg Tabs (Atorvastatin calcium) .... Take 1 tablet by mouth once a day 2)  Metformin Hcl 500 Mg Tabs (Metformin hcl) .... Take 1 tablet by mouth twice a day 3)  Norvasc 5 Mg Tabs (Amlodipine besylate) .... Take 1 tablet by mouth once a day 4)  Zoloft 100 Mg Tabs (Sertraline hcl) .... 1/2 once a day 5)  Freestyle Test Strp (Glucose blood) .... Use daily 6)  Clonazepam 1 Mg Tabs (Clonazepam) .... One at bedtime 7)  Proair Hfa 108 (90 Base) Mcg/act Aers (Albuterol sulfate) .... Two puffs every 6 hours as needed for shortness of breath 8)  Cyclobenzaprine Hcl 10 Mg Tabs (Cyclobenzaprine hcl) .... One every 8 hours for back pain 9)  Hydrocodone-acetaminophen 5-500 Mg Tabs (Hydrocodone-acetaminophen) .... One every 6 hours as needed for pain  Other Orders: Depo- Medrol 80mg  (J1040) Admin of Therapeutic Inj  intramuscular or subcutaneous (29528)    Medication Administration  Injection # 1:    Medication: Depo- Medrol 80mg     Diagnosis: BACK PAIN (ICD-724.5)    Route: IM    Site: RUOQ gluteus    Exp Date: 04/2012    Lot #: [id number]    Mfr: [employer]    Patient tolerated injection without complications    Given by: Duard Brady LPN (Nov 02, 2009 9:57 AM)  Orders Added: 1)  Depo- Medrol 80mg  [J1040] 2)  Admin of Therapeutic Inj  intramuscular or subcutaneous [41324]

## 2010-07-11 NOTE — Assessment & Plan Note (Signed)
Summary: not feeling well//ccm   Vital Signs:  Gonzalez profile:   70 year old female Weight:      205 pounds Temp:     98.2 degrees F oral BP sitting:   144 / 80  (right arm) Cuff size:   regular  Vitals Entered By: Duard Brady LPN (March 11, 2010 1:01 PM) CC: c/o nausea and vomiting since friday -on/off very little po intake Is Gonzalez Diabetic? Yes Did you bring your meter with you today? No CBG Result 113   CC:  c/o nausea and vomiting since friday -on/off very little po intake.  History of Present Illness: Stephanie Gonzalez who is seen today after two days for nausea and vomiting over the weekend.  Today she feels weak, but has advanced her diet and has had no further nausea or vomiting.  She denied any associated abdominal pain and did have a normal bowel movement earlier today.  She states that approximately 3 weeks ago.  She had similar symptoms.  Also, over the weekend that lasted two days.  Both times.  She was dealing with her elderly mother and she wonders about possible stress associated symptoms.  She has had prior abdominal operations, including a cholecystectomy, and did have a fairly recent screening colonoscopy.  There's been no associated fever or chills.  She has type 2 diabetes and hypertension  Allergies: 1)  ! Tetracycline  Past History:  Past Medical History: Reviewed history from 11/01/2009 and no changes required. Asthma Hyperlipidemia Hypertension Diabetes mellitus, type II  Review of Systems  The Gonzalez denies anorexia, fever, weight loss, weight gain, vision loss, decreased hearing, hoarseness, chest pain, syncope, dyspnea on exertion, peripheral edema, prolonged cough, headaches, hemoptysis, abdominal pain, melena, hematochezia, severe indigestion/heartburn, hematuria, incontinence, genital sores, muscle weakness, suspicious skin lesions, transient blindness, difficulty walking, depression, unusual weight change, abnormal bleeding,  enlarged lymph nodes, angioedema, and breast masses.    Physical Exam  General:  overweight-appearing.  appears weak, but in no acute distress.  Blood pressure normal.  Remains very animatedoverweight-appearing.   Head:  Normocephalic and atraumatic without obvious abnormalities. No apparent alopecia or balding. Eyes:  No corneal or conjunctival inflammation noted. EOMI. Perrla. Funduscopic exam benign, without hemorrhages, exudates or papilledema. Vision grossly normal. Mouth:  Oral mucosa and oropharynx without lesions or exudates.  Teeth in good repair. Neck:  No deformities, masses, or tenderness noted. Lungs:  Normal respiratory effort, chest expands symmetrically. Lungs are clear to auscultation, no crackles or wheezes. Heart:  Normal rate and regular rhythm. S1 and S2 normal without gallop, murmur, click, rub or other extra sounds. Abdomen:  obese soft and nontender.  Normal bowel sounds.  No guarding or distention   Impression & Recommendations:  Problem # 1:  NAUSEA (ICD-787.02) Gonzalez is clinically improved at the present time.   We will clinically observe at this point.  If symptoms recur will need to evaluate for intermittent partial small bowel obstruction  Problem # 2:  DIABETES MELLITUS, TYPE II (ICD-250.00)  Her updated medication list for this problem includes:    Metformin Hcl 500 Mg Tabs (Metformin hcl) .Marland Kitchen... Take 1 tablet by mouth twice a day  Orders: Capillary Blood Glucose/CBG (27253)  Her updated medication list for this problem includes:    Metformin Hcl 500 Mg Tabs (Metformin hcl) .Marland Kitchen... Take 1 tablet by mouth twice a day  Complete Medication List: 1)  Lipitor 40 Mg Tabs (Atorvastatin calcium) .... Take 1 tablet by mouth once a day 2)  Metformin Hcl 500 Mg Tabs (Metformin hcl) .... Take 1 tablet by mouth twice a day 3)  Freestyle Test Strp (Glucose blood) .... Use daily 4)  Clonazepam 1 Mg Tabs (Clonazepam) .... One at bedtime 5)  Proair Hfa 108 (90 Base)  Mcg/act Aers (Albuterol sulfate) .... Two puffs every 6 hours as needed for shortness of breath 6)  Cyclobenzaprine Hcl 10 Mg Tabs (Cyclobenzaprine hcl) .... One every 8 hours for back pain 7)  Hydrocodone-acetaminophen 5-500 Mg Tabs (Hydrocodone-acetaminophen) .... One every 6 hours as needed for pain 8)  Amlodipine Besylate 5 Mg Tabs (Amlodipine besylate) .... One daily 9)  Sertraline Hcl 100 Mg Tabs (Sertraline hcl) .... One half tablet daily 10)  Promethazine Hcl 25 Mg Supp (Promethazine hcl) .... Use every 4 hours as needed for nausea  Gonzalez Instructions: 1)  Drink clear liquids only for the next 24 hours, then slowly add other liquids and food as you  tolerate them. 2)  Limit your Sodium (Salt). Prescriptions: PROMETHAZINE HCL 25 MG SUPP (PROMETHAZINE HCL) use every 4 hours as needed for nausea  #12 x 2   Entered and Authorized by:   Gordy Savers  MD   Signed by:   Gordy Savers  MD on 03/11/2010   Method used:   Print then Give to Gonzalez   RxID:   [id number] CLONAZEPAM 1 MG  TABS (CLONAZEPAM) one at bedtime  #60 x 3   Entered and Authorized by:   Gordy Savers  MD   Signed by:   Gordy Savers  MD on 03/11/2010   Method used:   Print then Give to Gonzalez   RxID:   [id number]

## 2010-07-11 NOTE — Assessment & Plan Note (Signed)
Summary: cpx/pt req to come in fasting/cjr   Vital Signs:  Patient profile:   70 year old female Height:      65 inches Weight:      210 pounds BMI:     35.07 Temp:     98.1 degrees F oral Pulse rate:   76 / minute Resp:     14 per minute BP sitting:   116 / 80  (left arm)  Vitals Entered By: Willy Eddy, LPN (April 02, 2010 1:45 PM) CC: annual visit for disease management-fasting this pm--cbg running around 110-120 Is Patient Diabetic? Yes Did you bring your meter with you today? No   CC:  annual visit for disease management-fasting this pm--cbg running around 110-120.  History of Present Illness: 22 -year-old patient who is seen today for a wellness examination.  She has a history of type 2 diabetes;  she has maintained much glycemic control on metformin therapy alone.  She has a history of mild asthma that has been stable.  She has occasional back pain, which has not been problematic recently.  She has a history also of treated hypertension and dyslipidemia.  Medical regimen includes Lipitor 40 mg daily.  Here for Medicare AWV:  1.   Risk factors based on Past M, S, F history: cardiovascular risk factors include hypertension, dyslipidemia, and type 2 diabetes 2.   Physical Activities: fairly sedentary, but no activity restrictions 3.   Depression/mood: no history of depression or mood disorder 4.   Hearing: no deficits 5.   ADL's: independent in all aspects of daily living 6.   Fall Risk: low 7.   Home Safety: no problems identified ; [place] 8.   Height, weight, &visual acuity:height and weight stable.  Has gained 5 pounds, central last visit.  No visual acuity concerns 9.   Counseling: heart healthy diet, weight loss, more regular exercise.  All encouraged 10.   Labs ordered based on risk factors: amateur profile, including hemoglobin A1c and lipid profile will be reviewed 11.           Referral Coordination- follow mammogram and GYN 12.            Care Plan- continue heart healthy diet regular.  Exercise and attempt at weight loss 13.            Cognitive Assessment- alert and oriented, with normal affect.  No cognitive dysfunction, handles all executive functioning without difficulty    Preventive Screening-Counseling & Management  Alcohol-Tobacco     Smoking Status: never  Current Problems (verified): 1)  Bursitis, Right Hip  (ICD-726.5) 2)  Back Pain  (ICD-724.5) 3)  Diabetes Mellitus, Type II  (ICD-250.00) 4)  Special Screening For Malignant Neoplasms Colon  (ICD-V76.51) 5)  Viral Uri  (ICD-465.9) 6)  Aspiration of Vomitus  (ICD-934.9) 7)  Hypertension  (ICD-401.9) 8)  Hyperlipidemia  (ICD-272.4) 9)  Asthma  (ICD-493.90) 10)  Chest Wall Pain, Anterior  (ICD-786.52)  Current Medications (verified): 1)  Lipitor 40 Mg Tabs (Atorvastatin Calcium) .... Take 1 Tablet By Mouth Once A Day 2)  Metformin Hcl 500 Mg Tabs (Metformin Hcl) .... Take 1 Tablet By Mouth Twice A Day 3)  Freestyle Test   Strp (Glucose Blood) .... Use Daily 4)  Clonazepam 1 Mg  Tabs (Clonazepam) .... One At Bedtime 5)  Proair Hfa 108 (90 Base) Mcg/act Aers (Albuterol Sulfate) .... Two Puffs Every 6 Hours As Needed For Shortness of Breath 6)  Amlodipine Besylate 5 Mg Tabs (Amlodipine Besylate) .... One Daily 7)  Sertraline Hcl 100 Mg Tabs (Sertraline Hcl) .... One Half Tablet Daily  Allergies (verified): 1)  ! Tetracycline  Contraindications/Deferment of Procedures/Staging:    Test/Procedure: PAP Smear    Reason for deferment: hysterectomy   Past History:  Past Medical History: Reviewed history from 11/01/2009 and no changes required. Asthma Hyperlipidemia Hypertension Diabetes mellitus, type II  Past Surgical History: G3 P3, A0 Appendectomy Cholecystectomy Hysterectomy 1995 Tubal ligation Tonsillectomy colonoscopy 09-2009  Family History: Reviewed history from 10/06/2007 and no changes required. father ; history of bladder cancer    one brother died at age 19 of croup two sisters are in good health  Social History: Reviewed history from 03/09/2008 and no changes required. Married Occupation: Never Smoked Alcohol use-no Drug use-no Regular exercise-no  Review of Systems       The patient complains of weight gain.  The patient denies anorexia, fever, weight loss, vision loss, decreased hearing, hoarseness, chest pain, syncope, dyspnea on exertion, peripheral edema, prolonged cough, headaches, hemoptysis, abdominal pain, melena, hematochezia, severe indigestion/heartburn, hematuria, incontinence, genital sores, muscle weakness, suspicious skin lesions, transient blindness, difficulty walking, depression, unusual weight change, abnormal bleeding, enlarged lymph nodes, angioedema, and breast masses.    Physical Exam  General:  overweight-appearing.  124/80overweight-appearing.   Head:  Normocephalic and atraumatic without obvious abnormalities. No apparent alopecia or balding. Eyes:  No corneal or conjunctival inflammation noted. EOMI. Perrla. Funduscopic exam benign, without hemorrhages, exudates or papilledema. Vision grossly normal. Ears:  External ear exam shows no significant lesions or deformities.  Otoscopic examination reveals clear canals, tympanic membranes are intact bilaterally without bulging, retraction, inflammation or discharge. Hearing is grossly normal bilaterally. Mouth:  Oral mucosa and oropharynx without lesions or exudates.  Teeth in good repair. Neck:  No deformities, masses, or tenderness noted. Chest Wall:  No deformities, masses, or tenderness noted. Breasts:  No mass, nodules, thickening, tenderness, bulging, retraction, inflamation, nipple discharge or skin changes noted.   Lungs:  Normal respiratory effort, chest expands symmetrically. Lungs are clear to auscultation, no crackles or wheezes. Heart:  Normal rate and regular rhythm. S1 and S2 normal without gallop, murmur, click, rub or other  extra sounds. Abdomen:  Bowel sounds positive,abdomen soft and non-tender without masses, organomegaly or hernias noted. Msk:  No deformity or scoliosis noted of thoracic or lumbar spine.   Pulses:  R and L carotid,radial,femoral,dorsalis pedis and posterior tibial pulses are full and equal bilaterally Extremities:  No clubbing, cyanosis, edema, or deformity noted with normal full range of motion of all joints.   Neurologic:  No cranial nerve deficits noted. Station and gait are normal. Plantar reflexes are down-going bilaterally. DTRs are symmetrical throughout. Sensory, motor and coordinative functions appear intact. Skin:  Intact without suspicious lesions or rashes Cervical Nodes:  No lymphadenopathy noted Axillary Nodes:  No palpable lymphadenopathy Inguinal Nodes:  No significant adenopathy Psych:  Cognition and judgment appear intact. Alert and cooperative with normal attention span and concentration. No apparent delusions, illusions, hallucinations  Diabetes Management Exam:    Foot Exam (with socks and/or shoes not present):       Sensory-Pinprick/Light touch:          Left medial foot (L-4): normal          Left dorsal foot (L-5): normal          Left lateral foot (S-1): normal          Right medial foot (L-4):  normal          Right dorsal foot (L-5): normal          Right lateral foot (S-1): normal       Sensory-Monofilament:          Left foot: normal          Right foot: normal       Inspection:          Left foot: normal          Right foot: normal       Nails:          Left foot: normal          Right foot: normal    Foot Exam by Podiatrist:       Date: 04/02/2010       Results: no diabetic findings       Done by: pcp    Eye Exam:       Eye Exam done here today          Results: normal   Impression & Recommendations:  Problem # 1:  Preventive Health Care (ICD-V70.0)  Orders: Medicare -1st Annual Wellness Visit [phone])  Complete Medication List: 1)   Lipitor 40 Mg Tabs (Atorvastatin calcium) .... Take 1 tablet by mouth once a day 2)  Metformin Hcl 500 Mg Tabs (Metformin hcl) .... Take 1 tablet by mouth twice a day 3)  Freestyle Test Strp (Glucose blood) .... Use daily 4)  Clonazepam 1 Mg Tabs (Clonazepam) .... One at bedtime 5)  Proair Hfa 108 (90 Base) Mcg/act Aers (Albuterol sulfate) .... Two puffs every 6 hours as needed for shortness of breath 6)  Amlodipine Besylate 5 Mg Tabs (Amlodipine besylate) .... One daily 7)  Sertraline Hcl 100 Mg Tabs (Sertraline hcl) .... One half tablet daily  Other Orders: Pneumococcal Vaccine (60454) Admin 1st Vaccine (09811) TD Toxoids IM 7 YR + (91478) Admin of Any Addtl Vaccine (29562) Venipuncture (13086) TLB-Lipid Panel (80061-LIPID) TLB-BMP (Basic Metabolic Panel-BMET) (80048-METABOL) TLB-CBC Platelet - w/Differential (85025-CBCD) TLB-Hepatic/Liver Function Pnl (80076-HEPATIC) TLB-TSH (Thyroid Stimulating Hormone) (84443-TSH) TLB-A1C / Hgb A1C (Glycohemoglobin) (83036-A1C) TLB-Microalbumin/Creat Ratio, Urine (82043-MALB)  Patient Instructions: 1)  Please schedule a follow-up appointment in 3 months. 2)  It is important that you exercise regularly at least 20 minutes 5 times a week. If you develop chest pain, have severe difficulty breathing, or feel very tired , stop exercising immediately and seek medical attention. 3)  You need to lose weight. Consider a lower calorie diet and regular exercise.  4)  Schedule your mammogram. 5)  Take calcium +Vitamin D daily. 6)  Check your blood sugars regularly. If your readings are usually above : or below 70 you should contact our office. 7)  It is important that your Diabetic A1c level is checked every 3 months. 8)  See your eye doctor yearly to check for diabetic eye damage.   Orders Added: 1)  Pneumococcal Vaccine [90732] 2)  Admin 1st Vaccine [90471] 3)  TD Toxoids IM 7 YR + [90714] 4)  Admin of Any Addtl Vaccine [90472] 5)  Medicare -1st  Annual Wellness Visit [G0438] 6)  Est. Patient Level III [57846] 7)  Venipuncture [36415] 8)  TLB-Lipid Panel [80061-LIPID] 9)  TLB-BMP (Basic Metabolic Panel-BMET) [80048-METABOL] 10)  TLB-CBC Platelet - w/Differential [85025-CBCD] 11)  TLB-Hepatic/Liver Function Pnl [80076-HEPATIC] 12)  TLB-TSH (Thyroid Stimulating Hormone) [84443-TSH] 13)  TLB-A1C / Hgb A1C (Glycohemoglobin) [83036-A1C] 14)  TLB-Microalbumin/Creat Ratio, Urine [82043-MALB]  Immunizations Administered:  Pneumonia Vaccine:    Vaccine Type: Pneumovax (Medicare)    Site: right deltoid    Mfr: Merck    Dose: 0.5 ml    Route: IM    Given by: Willy Eddy, LPN    Exp. Date: 10/01/2011    Lot #: [id number]    VIS given: 05/14/09 version given April 02, 2010.  Tetanus Vaccine:    Vaccine Type: Td    Site: left deltoid    Mfr: GlaxoSmithKline    Dose: 0.5 ml    Route: IM    Given by: Willy Eddy, LPN    Exp. Date: 03/28/2012    Lot #: [id number]    VIS given: 04/26/08 version given April 02, 2010.   Immunizations Administered:  Pneumonia Vaccine:    Vaccine Type: Pneumovax (Medicare)    Site: right deltoid    Mfr: Merck    Dose: 0.5 ml    Route: IM    Given by: Willy Eddy, LPN    Exp. Date: 10/01/2011    Lot #: [id number]    VIS given: 05/14/09 version given April 02, 2010.  Tetanus Vaccine:    Vaccine Type: Td    Site: left deltoid    Mfr: GlaxoSmithKline    Dose: 0.5 ml    Route: IM    Given by: Willy Eddy, LPN    Exp. Date: 03/28/2012    Lot #: [id number]    VIS given: 04/26/08 version given April 02, 2010.   Preventive Care Screening  Mammogram:    Date:  07/09/2009    Next Due:  07/2010    Results:  normal      Appended Document: Orders Update    Clinical Lists Changes  Orders: Added new Service order of Specimen Handling (29562) - Signed      Appended Document: Orders Update    Clinical Lists Changes  Orders: Added new Service order of  EKG w/ Interpretation (93000) - Signed

## 2010-07-11 NOTE — Progress Notes (Signed)
Summary: refill clonazapam  Phone Note Call from Patient   Caller: Patient Call For: Gordy Savers  MD Reason for Call: Refill Medication Summary of Call: where did we call in clonazapam refill ? Initial call taken by: Duard Brady LPN,  December 14, 270 9:04 AM  Follow-up for Phone Call        was unable to contact by phone earlier- [facility]  #90 only - may have to pay out of pocket  Follow-up by: Duard Brady LPN,  December 13, 5364 9:05 AM    Prescriptions: CLONAZEPAM 1 MG  TABS (CLONAZEPAM) one at bedtime  #90 x 0   Entered by:   Duard Brady LPN   Authorized by:   Gordy Savers  MD   Signed by:   Duard Brady LPN on 44/08/4740   Method used:   Printed then faxed to ...       [phone]       [address]       [place], [place]  [address]       Ph: [phone] or [phone]       Fax: [phone]   RxID:   [id number]
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
## 2010-07-11 NOTE — Letter (Signed)
Summary: Diabetic Instructions  [facility]  [address] [place], [place] [address]   Phone: [phone]  Fax: [phone]    Stephanie Gonzalez September 27, 1940 MRN: [id number]   _  _   ORAL DIABETIC MEDICATION INSTRUCTIONS  The day before your procedure:   Take your diabetic pill as you do normally  The day of your procedure:   Do not take your diabetic pill    We will check your blood sugar levels during the admission process and again in Recovery before discharging you home  ________________________________________________________________________

## 2010-07-11 NOTE — Procedures (Signed)
Summary: Colonoscopy  Patient: [name] Note: All result statuses are Final unless otherwise noted.  Tests: (1) Colonoscopy (COL)   COL Colonoscopy           DONE     [facility]     [address]     [place], [place]  [address]           COLONOSCOPY PROCEDURE REPORT           PATIENT:  Stephanie Gonzalez, Stephanie Gonzalez  MR#:  [id number]     BIRTHDATE:  12-08-1940, 68 yrs. old  GENDER:  female     ENDOSCOPIST:  Hedwig Morton. Juanda Chance, MD     REF. BY:  Eleonore Chiquito, M.D.     PROCEDURE DATE:  09/07/2009     PROCEDURE:  Colonoscopy 37106     ASA CLASS:  Class I     INDICATIONS:  Routine Risk Screening     MEDICATIONS:   Versed 8 mg, Fentanyl 75 mcg           DESCRIPTION OF PROCEDURE:   After the risks benefits and     alternatives of the procedure were thoroughly explained, informed     consent was obtained.  Digital rectal exam was performed and     revealed no rectal masses.   The LB [id number] endoscope     was introduced through the anus and advanced to the cecum, which     was identified by both the appendix and ileocecal valve, without     limitations.  The quality of the prep was good, using MiraLax.     The instrument was then slowly withdrawn as the colon was fully     examined.     <<PROCEDUREIMAGES>>           FINDINGS:  No polyps or cancers were seen (see image1, image2,     image3, and image4).   Retroflexed views in the rectum revealed no     abnormalities.    The scope was then withdrawn from the patient     and the procedure completed.           COMPLICATIONS:  None     ENDOSCOPIC IMPRESSION:     1) No polyps or cancers     2) Normal colonoscopy     sharp angle at the pelvic rim, had to switch to a pediatric     scope     RECOMMENDATIONS:     1) high fiber diet     REPEAT EXAM:  In 10 year(s) for.           ______________________________     Hedwig Morton. Juanda Chance, MD           CC:           n.     eSIGNED:   Hedwig Morton. Parnika Tweten at 09/07/2009 12:22 PM        Abigayl, Hor, [id number]  Note: An exclamation mark (!) indicates a result that was not dispersed into the flowsheet. Document Creation Date: 09/07/2009 12:22 PM _______________________________________________________________________  (1) Order result status: Final Collection or observation date-time: 09/07/2009 12:16 Requested date-time:  Receipt date-time:  Reported date-time:  Referring Physician:   Ordering Physician: Lina Sar [id number]) Specimen Source:  Source: Launa Grill Order Number: [id number] Lab site:   Appended Document: Colonoscopy    Clinical Lists Changes  Observations: Added new observation of COLONNXTDUE: 09/2019 (09/07/2009 12:46)

## 2010-07-11 NOTE — Letter (Signed)
Summary: Previsit letter  [facility]  [address] [place], [place] [address]   Phone: [phone]  Fax: [phone]       08/10/2009 MRN: [id number]  Stephanie Gonzalez [address] [place], [place]  [address]  Dear [name],  Welcome to the Gastroenterology Division at [facility].    You are scheduled to see a nurse for your pre-procedure visit on 08/31/2009 at 8:00am on the 3rd floor at [facility], [address]  We ask that you try to arrive at our office 15 minutes prior to your appointment time to allow for check-in.  Your nurse visit will consist of discussing your medical and surgical history, your immediate family medical history, and your medications.    Please bring a complete list of all your medications or, if you prefer, bring the medication bottles and we will list them.  We will need to be aware of both prescribed and over the counter drugs.  We will need to know exact dosage information as well.  If you are on blood thinners (Coumadin, Plavix, Aggrenox, Ticlid, etc.) please call our office today/prior to your appointment, as we need to consult with your physician about holding your medication.   Please be prepared to read and sign documents such as consent forms, a financial agreement, and acknowledgement forms.  If necessary, and with your consent, a friend or relative is welcome to sit-in on the nurse visit with you.  Please bring your insurance card so that we may make a copy of it.  If your insurance requires a referral to see a specialist, please bring your referral form from your primary care physician.  No co-pay is required for this nurse visit.     If you cannot keep your appointment, please call [phone] to cancel or reschedule prior to your appointment date.  This allows [place] the opportunity to schedule an appointment for another patient in need of care.    Thank you for choosing [facility] for your medical needs.  We  appreciate the opportunity to care for you.  Please visit [place] at our website  to learn more about our practice.                     Sincerely.                                                                                                                   The Gastroenterology Division

## 2010-08-06 ENCOUNTER — Other Ambulatory Visit: Payer: Self-pay | Admitting: Internal Medicine

## 2010-08-06 ENCOUNTER — Ambulatory Visit
Admission: RE | Admit: 2010-08-06 | Discharge: 2010-08-06 | Disposition: A | Discharge status: Home / Self Care | Payer: MEDICARE | Source: Ambulatory Visit | Attending: Internal Medicine | Admitting: Internal Medicine

## 2010-08-06 DIAGNOSIS — Z1231 Encounter for screening mammogram for malignant neoplasm of breast: Secondary | ICD-10-CM

## 2010-08-28 LAB — GLUCOSE, CAPILLARY
Glucose-Capillary: 103 mg/dL — ABNORMAL HIGH (ref 70–99)
Glucose-Capillary: 104 mg/dL — ABNORMAL HIGH (ref 70–99)
Glucose-Capillary: 122 mg/dL — ABNORMAL HIGH (ref 70–99)

## 2010-09-03 ENCOUNTER — Encounter: Payer: Self-pay | Admitting: Internal Medicine

## 2010-09-03 ENCOUNTER — Ambulatory Visit (INDEPENDENT_AMBULATORY_CARE_PROVIDER_SITE_OTHER): Payer: MEDICARE | Admitting: Internal Medicine

## 2010-09-03 DIAGNOSIS — N200 Calculus of kidney: Secondary | ICD-10-CM | POA: Insufficient documentation

## 2010-09-03 DIAGNOSIS — M545 Low back pain: Secondary | ICD-10-CM

## 2010-09-03 DIAGNOSIS — I10 Essential (primary) hypertension: Secondary | ICD-10-CM

## 2010-09-03 DIAGNOSIS — J069 Acute upper respiratory infection, unspecified: Secondary | ICD-10-CM

## 2010-09-03 DIAGNOSIS — E119 Type 2 diabetes mellitus without complications: Secondary | ICD-10-CM

## 2010-09-03 DIAGNOSIS — R300 Dysuria: Secondary | ICD-10-CM

## 2010-09-03 LAB — POCT URINALYSIS DIPSTICK
Glucose, UA: NEGATIVE
Nitrite, UA: NEGATIVE
Urobilinogen, UA: 0.2

## 2010-09-03 MED ORDER — HYDROCODONE-ACETAMINOPHEN 5-500 MG PO TABS: 1.0000 | ORAL_TABLET | Freq: Four times a day (QID) | ORAL | Status: DC | PRN

## 2010-09-03 MED ORDER — MEPERIDINE HCL 50 MG/ML IJ SOLN
50.0000 mg | Freq: Once | INTRAMUSCULAR | Status: AC
  Administered 2010-09-03: 50 mg via INTRAMUSCULAR

## 2010-09-03 MED ORDER — PROMETHAZINE HCL 50 MG/ML IJ SOLN
50.0000 mg | Freq: Four times a day (QID) | INTRAMUSCULAR | Status: DC | PRN
  Administered 2010-09-03: 50 mg via INTRAVENOUS

## 2010-09-03 NOTE — Patient Instructions (Signed)
Drink as much fluid as you  can tolerate over the next few days  Call or return to clinic prn if these symptoms worsen or fail to improve as anticipated.  

## 2010-09-03 NOTE — Progress Notes (Signed)
  Subjective:    Patient ID: Stephanie Gonzalez, female    DOB: Mar 28, 1941, 70 y.o.   MRN: [id number]  HPI  70 year old patient who has a history of hypertension and diabetes. She also has a history of nephrolithiasis. She has seen urology in the past for symptomatic renal stone disease. She has passed a stone in the past spontaneously. For the past few days she's had some diffuse lower Donald pain. Last night she began having more intense right flank pain there is some radiation to the right lower abdominal area she feels this pain is qualitatively summer to her pain of renal colic in the past  Review of Systems  Constitutional: Negative.   HENT: Negative for hearing loss, congestion, sore throat, rhinorrhea, dental problem, sinus pressure and tinnitus.   Eyes: Negative for pain, discharge and visual disturbance.  Respiratory: Negative for cough and shortness of breath.   Cardiovascular: Negative for chest pain, palpitations and leg swelling.  Gastrointestinal: Negative for nausea, vomiting, abdominal pain, diarrhea, constipation, blood in stool and abdominal distention.  Genitourinary: Negative for dysuria, urgency, frequency, hematuria, flank pain, vaginal bleeding, vaginal discharge, difficulty urinating, vaginal pain and pelvic pain.  Musculoskeletal: Positive for back pain. Negative for joint swelling, arthralgias and gait problem.  Skin: Negative for rash.  Neurological: Negative for dizziness, syncope, speech difficulty, weakness, numbness and headaches.  Hematological: Negative for adenopathy.  Psychiatric/Behavioral: Negative for behavioral problems, dysphoric mood and agitation. The patient is not nervous/anxious.        Objective:   Physical Exam  Constitutional: She is oriented to person, place, and time. She appears well-developed and well-nourished.        Uncomfortable but in no acute distress  HENT:  Head: Normocephalic.  Right Ear: External ear normal.  Left Ear: External  ear normal.  Mouth/Throat: Oropharynx is clear and moist.  Eyes: Conjunctivae and EOM are normal. Pupils are equal, round, and reactive to light.  Neck: Normal range of motion. Neck supple. No thyromegaly present.  Cardiovascular: Normal rate, regular rhythm, normal heart sounds and intact distal pulses.   Pulmonary/Chest: Effort normal and breath sounds normal.  Abdominal: Soft. Bowel sounds are normal. She exhibits no mass. There is no tenderness.  Musculoskeletal: Normal range of motion.        No tenderness in the lower back or flank region  Lymphadenopathy:    She has no cervical adenopathy.  Neurological: She is alert and oriented to person, place, and time.  Skin: Skin is warm and dry. No rash noted.  Psychiatric: She has a normal mood and affect. Her behavior is normal.          Assessment & Plan:   right lower back pain. This is most consistent with renal colic. She does have trace hematuria on a urinalysis and has had a similar symptom complex in the past related to kidney stone disease. We'll force fluids treat symptomatically. Her pain medications were refilled. If not improved tomorrow we'll set up to see urology  and consider a CT urogram  Hypertension stable

## 2010-09-06 ENCOUNTER — Telehealth: Payer: Self-pay | Admitting: *Deleted

## 2010-09-06 DIAGNOSIS — N200 Calculus of kidney: Secondary | ICD-10-CM

## 2010-09-06 DIAGNOSIS — R52 Pain, unspecified: Secondary | ICD-10-CM

## 2010-09-06 NOTE — Telephone Encounter (Signed)
OK-PLEASE SET UP TODAY

## 2010-09-06 NOTE — Telephone Encounter (Signed)
Spoke with pt - informed of order for urology referal - terri will call once set up. KIK

## 2010-09-06 NOTE — Telephone Encounter (Signed)
Pt would like to have a referral to a URO for continued kidney stone pain ASAP.

## 2010-09-07 ENCOUNTER — Emergency Department (HOSPITAL_BASED_OUTPATIENT_CLINIC_OR_DEPARTMENT_OTHER)
Admission: EM | Admit: 2010-09-07 | Discharge: 2010-09-07 | Disposition: A | Discharge status: Home / Self Care | Payer: MEDICARE | Attending: Emergency Medicine | Admitting: Emergency Medicine

## 2010-09-07 ENCOUNTER — Emergency Department (INDEPENDENT_AMBULATORY_CARE_PROVIDER_SITE_OTHER): Payer: MEDICARE

## 2010-09-07 DIAGNOSIS — E119 Type 2 diabetes mellitus without complications: Secondary | ICD-10-CM | POA: Insufficient documentation

## 2010-09-07 DIAGNOSIS — R109 Unspecified abdominal pain: Secondary | ICD-10-CM

## 2010-09-07 DIAGNOSIS — Z79899 Other long term (current) drug therapy: Secondary | ICD-10-CM | POA: Insufficient documentation

## 2010-09-07 DIAGNOSIS — I10 Essential (primary) hypertension: Secondary | ICD-10-CM | POA: Insufficient documentation

## 2010-09-07 DIAGNOSIS — E7800 Pure hypercholesterolemia, unspecified: Secondary | ICD-10-CM | POA: Insufficient documentation

## 2010-09-07 DIAGNOSIS — R1031 Right lower quadrant pain: Secondary | ICD-10-CM | POA: Insufficient documentation

## 2010-09-07 DIAGNOSIS — IMO0002 Reserved for concepts with insufficient information to code with codable children: Secondary | ICD-10-CM | POA: Insufficient documentation

## 2010-09-07 LAB — BASIC METABOLIC PANEL
BUN: 10 mg/dL (ref 6–23)
Chloride: 104 mEq/L (ref 96–112)
Potassium: 3.2 mEq/L — ABNORMAL LOW (ref 3.5–5.1)
Sodium: 145 mEq/L (ref 135–145)

## 2010-09-07 LAB — CBC
Hemoglobin: 12.2 g/dL (ref 12.0–15.0)
MCH: 28.2 pg (ref 26.0–34.0)
MCV: 82.9 fL (ref 78.0–100.0)
RBC: 4.33 MIL/uL (ref 3.87–5.11)

## 2010-09-07 LAB — DIFFERENTIAL
Lymphocytes Relative: 39 % (ref 12–46)
Lymphs Abs: 3.6 10*3/uL (ref 0.7–4.0)
Monocytes Relative: 8 % (ref 3–12)
Neutro Abs: 4.6 10*3/uL (ref 1.7–7.7)
Neutrophils Relative %: 50 % (ref 43–77)

## 2010-09-07 LAB — URINALYSIS, ROUTINE W REFLEX MICROSCOPIC
Bilirubin Urine: NEGATIVE
Glucose, UA: NEGATIVE mg/dL
Ketones, ur: NEGATIVE mg/dL
pH: 7 (ref 5.0–8.0)

## 2010-09-09 ENCOUNTER — Ambulatory Visit (INDEPENDENT_AMBULATORY_CARE_PROVIDER_SITE_OTHER): Payer: MEDICARE | Admitting: Internal Medicine

## 2010-09-09 ENCOUNTER — Encounter: Payer: Self-pay | Admitting: Internal Medicine

## 2010-09-09 DIAGNOSIS — N200 Calculus of kidney: Secondary | ICD-10-CM

## 2010-09-09 DIAGNOSIS — R21 Rash and other nonspecific skin eruption: Secondary | ICD-10-CM

## 2010-09-09 DIAGNOSIS — I10 Essential (primary) hypertension: Secondary | ICD-10-CM

## 2010-09-09 DIAGNOSIS — E119 Type 2 diabetes mellitus without complications: Secondary | ICD-10-CM

## 2010-09-09 DIAGNOSIS — M549 Dorsalgia, unspecified: Secondary | ICD-10-CM

## 2010-09-09 LAB — GLUCOSE, POCT (MANUAL RESULT ENTRY): POC Glucose: 162

## 2010-09-09 MED ORDER — METHYLPREDNISOLONE ACETATE 80 MG/ML IJ SUSP
80.0000 mg | Freq: Once | INTRAMUSCULAR | Status: AC
  Administered 2010-09-09: 80 mg via INTRAMUSCULAR

## 2010-09-09 MED ORDER — VALACYCLOVIR HCL 1 G PO TABS: 1000.0000 mg | ORAL_TABLET | Freq: Two times a day (BID) | ORAL | Status: AC

## 2010-09-09 MED ORDER — GABAPENTIN 100 MG PO CAPS: 100.0000 mg | ORAL_CAPSULE | Freq: Three times a day (TID) | ORAL | Status: DC

## 2010-09-09 NOTE — Progress Notes (Signed)
  Subjective:    Patient ID: Stephanie Gonzalez, female    DOB: 1941/04/29, 70 y.o.   MRN: [id number]  HPI  70 year old patient who has a history of nephrolithiasis. She was seen 4 days ago with right flank pain with radiation to the right abdominal area. She was felt that she likely had recurrent stone disease. She was seen in the ED on Friday due to worsening right flank pain and a CT urogram was negative for stone disease. It was suggested that she perhaps had a lumbar disc causing her pain. Subsequently she has developed a rash in the right flank area consistent with shingles. She has had no prior shingles vaccination.    Review of Systems  Constitutional: Negative.   HENT: Negative for hearing loss, congestion, sore throat, rhinorrhea, dental problem, sinus pressure and tinnitus.   Eyes: Negative for pain, discharge and visual disturbance.  Respiratory: Negative for cough and shortness of breath.   Cardiovascular: Negative for chest pain, palpitations and leg swelling.  Gastrointestinal: Negative for nausea, vomiting, abdominal pain, diarrhea, constipation, blood in stool and abdominal distention.  Genitourinary: Positive for flank pain. Negative for dysuria, urgency, frequency, hematuria, vaginal bleeding, vaginal discharge, difficulty urinating, vaginal pain and pelvic pain.  Musculoskeletal: Negative for joint swelling, arthralgias and gait problem.  Skin: Positive for rash.  Neurological: Negative for dizziness, syncope, speech difficulty, weakness, numbness and headaches.  Hematological: Negative for adenopathy.  Psychiatric/Behavioral: Negative for behavioral problems, dysphoric mood and agitation. The patient is not nervous/anxious.        Objective:   Physical Exam  Constitutional: She appears well-developed and well-nourished. No distress.       Uncomfortable but in no acute distress  Musculoskeletal: Normal range of motion. She exhibits no edema and no tenderness.       Negative  straight leg test  Skin: Rash noted.       Herpetic rash in the right flank area          Assessment & Plan:  Herpes zoster. She has significant discomfort. We'll treat with Valtrex and continue analgesics. We'll also treat with Neurontin Hypertension Diabetes stable we'll treat with Depo-Medrol. She also has a contact dermatitis involving her extremities

## 2010-09-09 NOTE — Patient Instructions (Signed)
Take medications as prescribed Call or return to clinic prn if these symptoms worsen or fail to improve as anticipated.

## 2010-09-16 ENCOUNTER — Ambulatory Visit (INDEPENDENT_AMBULATORY_CARE_PROVIDER_SITE_OTHER): Payer: MEDICARE | Admitting: Internal Medicine

## 2010-09-16 ENCOUNTER — Encounter: Payer: Self-pay | Admitting: Internal Medicine

## 2010-09-16 DIAGNOSIS — E785 Hyperlipidemia, unspecified: Secondary | ICD-10-CM

## 2010-09-16 DIAGNOSIS — I10 Essential (primary) hypertension: Secondary | ICD-10-CM

## 2010-09-16 DIAGNOSIS — B029 Zoster without complications: Secondary | ICD-10-CM

## 2010-09-16 MED ORDER — HYDROCODONE-ACETAMINOPHEN 5-500 MG PO TABS: 1.0000 | ORAL_TABLET | Freq: Four times a day (QID) | ORAL | Status: DC | PRN

## 2010-09-16 NOTE — Progress Notes (Signed)
  Subjective:    Patient ID: Stephanie Gonzalez, female    DOB: 09/15/1940, 70 y.o.   MRN: [id number]  HPI 70 year old patient who is seen today in followup. She continues to have significant pain in the right flank region. She has completed Valtrex therapy for shingles. She is on low-dose Neurontin. She is taking her pain medications and frequently.   Review of Systems  Skin: Positive for rash.       Objective:   Physical Exam  Constitutional: She appears well-developed and well-nourished. No distress.       Overweight appears uncomfortable but in no acute distress  Skin:       Resolving herpetic rash in the right flank area          Assessment & Plan:  Symptomatic herpes. The patient was instructed on more liberal use of her analgesic. She'll continue Neurontin with at bedtime dose of an additional 100 mg.

## 2010-09-16 NOTE — Patient Instructions (Signed)
Take Vicodin every 4-6 hours as needed for pain control Increase Neurontin to 100 mg 4 times daily with a bedtime dose  Call or return to clinic prn if these symptoms worsen or fail to improve as anticipated.

## 2010-09-17 ENCOUNTER — Other Ambulatory Visit: Payer: Self-pay

## 2010-09-17 MED ORDER — CLONAZEPAM 1 MG PO TABS: 1.0000 mg | ORAL_TABLET | Freq: Every day | ORAL | Status: DC

## 2010-09-17 NOTE — Telephone Encounter (Signed)
Faxed rf auth back to [employer]

## 2010-09-22 ENCOUNTER — Emergency Department (HOSPITAL_BASED_OUTPATIENT_CLINIC_OR_DEPARTMENT_OTHER)
Admission: EM | Admit: 2010-09-22 | Discharge: 2010-09-22 | Disposition: A | Discharge status: Home / Self Care | Payer: MEDICARE | Attending: Emergency Medicine | Admitting: Emergency Medicine

## 2010-09-22 ENCOUNTER — Emergency Department (INDEPENDENT_AMBULATORY_CARE_PROVIDER_SITE_OTHER): Payer: MEDICARE

## 2010-09-22 DIAGNOSIS — B029 Zoster without complications: Secondary | ICD-10-CM | POA: Insufficient documentation

## 2010-09-22 DIAGNOSIS — G8929 Other chronic pain: Secondary | ICD-10-CM | POA: Insufficient documentation

## 2010-09-22 DIAGNOSIS — M545 Low back pain, unspecified: Secondary | ICD-10-CM | POA: Insufficient documentation

## 2010-09-22 DIAGNOSIS — Z79899 Other long term (current) drug therapy: Secondary | ICD-10-CM | POA: Insufficient documentation

## 2010-09-22 DIAGNOSIS — E7800 Pure hypercholesterolemia, unspecified: Secondary | ICD-10-CM | POA: Insufficient documentation

## 2010-09-22 DIAGNOSIS — R109 Unspecified abdominal pain: Secondary | ICD-10-CM

## 2010-09-22 DIAGNOSIS — I10 Essential (primary) hypertension: Secondary | ICD-10-CM | POA: Insufficient documentation

## 2010-09-22 DIAGNOSIS — M549 Dorsalgia, unspecified: Secondary | ICD-10-CM

## 2010-09-22 DIAGNOSIS — E119 Type 2 diabetes mellitus without complications: Secondary | ICD-10-CM | POA: Insufficient documentation

## 2010-09-22 LAB — BASIC METABOLIC PANEL
BUN: 15 mg/dL (ref 6–23)
Chloride: 104 mEq/L (ref 96–112)
Creatinine, Ser: 0.6 mg/dL (ref 0.4–1.2)
Glucose, Bld: 99 mg/dL (ref 70–99)
Potassium: 3.9 mEq/L (ref 3.5–5.1)
Sodium: 137 mEq/L (ref 135–145)

## 2010-09-22 LAB — CBC
HCT: 37.2 % (ref 36.0–46.0)
Hemoglobin: 12 g/dL (ref 12.0–15.0)
MCH: 28.1 pg (ref 26.0–34.0)
MCHC: 32.3 g/dL (ref 30.0–36.0)
RBC: 4.27 MIL/uL (ref 3.87–5.11)

## 2010-09-22 LAB — DIFFERENTIAL
Basophils Absolute: 0.1 10*3/uL (ref 0.0–0.1)
Eosinophils Absolute: 0.1 10*3/uL (ref 0.0–0.7)
Eosinophils Relative: 1 % (ref 0–5)
Lymphocytes Relative: 39 % (ref 12–46)
Neutrophils Relative %: 52 % (ref 43–77)

## 2010-09-22 LAB — URINALYSIS, ROUTINE W REFLEX MICROSCOPIC
Bilirubin Urine: NEGATIVE
Glucose, UA: NEGATIVE mg/dL
Hgb urine dipstick: NEGATIVE
Specific Gravity, Urine: 1.007 (ref 1.005–1.030)

## 2010-09-23 ENCOUNTER — Telehealth: Payer: Self-pay | Admitting: *Deleted

## 2010-09-23 ENCOUNTER — Ambulatory Visit (INDEPENDENT_AMBULATORY_CARE_PROVIDER_SITE_OTHER): Payer: MEDICARE | Admitting: Internal Medicine

## 2010-09-23 ENCOUNTER — Encounter: Payer: Self-pay | Admitting: Internal Medicine

## 2010-09-23 VITALS — BP 118/70 | Temp 98.1°F

## 2010-09-23 DIAGNOSIS — M545 Low back pain: Secondary | ICD-10-CM

## 2010-09-23 DIAGNOSIS — B029 Zoster without complications: Secondary | ICD-10-CM

## 2010-09-23 LAB — URINALYSIS, ROUTINE W REFLEX MICROSCOPIC
Bilirubin Urine: NEGATIVE
Ketones, ur: NEGATIVE mg/dL
Nitrite: NEGATIVE
Protein, ur: NEGATIVE mg/dL
Urobilinogen, UA: 1 mg/dL (ref 0.0–1.0)

## 2010-09-23 LAB — URINE CULTURE
Colony Count: 40000
Culture  Setup Time: 201204151718

## 2010-09-23 LAB — URINE MICROSCOPIC-ADD ON

## 2010-09-23 MED ORDER — PROMETHAZINE HCL 50 MG/ML IJ SOLN
50.0000 mg | Freq: Once | INTRAMUSCULAR | Status: AC
Start: 2010-09-23 — End: 2010-09-23
  Administered 2010-09-23: 50 mg via INTRAVENOUS

## 2010-09-23 MED ORDER — METHYLPREDNISOLONE ACETATE 80 MG/ML IJ SUSP
80.0000 mg | Freq: Once | INTRAMUSCULAR | Status: AC
  Administered 2010-09-23: 80 mg via INTRAMUSCULAR

## 2010-09-23 NOTE — Progress Notes (Signed)
  Subjective:    Patient ID: Stephanie Gonzalez, female    DOB: Aug 13, 1940, 70 y.o.   MRN: [id number]  HPI 70 year old patient who has been seen recently due to to shingles involving the right flank area. 69 days ago the patient had acute lumbar pain when she bent over and has been quite uncomfortable sense. Due to worsening pain she was seen at the emergency room yesterday for an abdominal CT scan was obtained this revealed no definite pathological findings of an acute nature there was a suggestion of dilated loops of small bowel. The patient denies any GI complaints she has no abdominal pain distention nausea vomiting and she states her bowel movements are normal she still complains of significant pain I will lumbar region aggravated by bending stooping and movement. She does have Percocet. No constitutional complaints. She denies any bowel or bladder symptoms and denies any leg weakness   Review of Systems  Constitutional: Negative.   HENT: Negative for hearing loss, congestion, sore throat, rhinorrhea, dental problem, sinus pressure and tinnitus.   Eyes: Negative for pain, discharge and visual disturbance.  Respiratory: Negative for cough and shortness of breath.   Cardiovascular: Negative for chest pain, palpitations and leg swelling.  Gastrointestinal: Negative for nausea, vomiting, abdominal pain, diarrhea, constipation, blood in stool and abdominal distention.  Genitourinary: Negative for dysuria, urgency, frequency, hematuria, flank pain, vaginal bleeding, vaginal discharge, difficulty urinating, vaginal pain and pelvic pain.  Musculoskeletal: Positive for back pain. Negative for joint swelling, arthralgias and gait problem.  Skin: Negative for rash.  Neurological: Negative for dizziness, syncope, speech difficulty, weakness, numbness and headaches.  Hematological: Negative for adenopathy.  Psychiatric/Behavioral: Negative for behavioral problems, dysphoric mood and agitation. The patient is  not nervous/anxious.        Objective:   Physical Exam  Constitutional: She appears well-developed and well-nourished.       Overweight uncomfortable but in not acute distress  Musculoskeletal:       No significant tenderness in the lumbar area. Straight leg test normal;, she is able to walk without difficulty  Skin:       Resolving herpetic rash right flank with a few dry crusted lesions          Assessment & Plan:  Acute low back pain Resolving shingles Doubt early bowel obstruction  We'll continue symptomatic treatment She will call if unimproved

## 2010-09-23 NOTE — Telephone Encounter (Signed)
[facility] Triage Call Report Triage Record Num: [id number] Operator: Freddie Breech Patient Name: Stephanie Gonzalez Call Date & Time: 09/22/2010 7:39:02AM Patient Phone: [phone] PCP: Gordy Savers Patient Gender: Female PCP Fax : [phone] Patient DOB: September 07, 1940 Practice Name: [facility] Reason for Call: C/o lower back pain due to a bulging disk dx'd on 09/07/10 at [facility]. She was also dx'd w/shingles on 09/09/10 per her PCP. She has had ofc visits x 2-3 for pain w/shingles. She dropped something on the floor one day last week (she cannot remember the day), could not stand. Called the ofc was advised to wait until 09/23/10 for tx. "I can't live like this. There's something wrong with my back. " ? mid back pain, pt cannot explain where the pain is. Cannot sand upright. Advised ED now per Back Pain Protocol. Pt will go to [facility]. Protocol(s) Used: Back Symptoms Recommended Outcome per Protocol: See ED Immediately Reason for Outcome: New onset of severe disabling back pain (unable to stand upright) Care Advice: ~ Protect the patient from falling or other harm. ~ Another adult should drive. ~ Do not give the patient anything to eat or drink. Write down provider's name. List or place the following in a bag for transport with the patient: current prescription and/or nonprescription medications; alternative treatments, therapies and medications; and street drugs. ~ 09/22/2010 7:53:22AM Page 1 of 1 CA

## 2010-09-23 NOTE — Patient Instructions (Signed)
Most patients with low back pain will improve with time over the next two to 6 weeks.  Keep active but avoid any activities that cause pain.  Apply moist heat to the low back area several times daily.  Call or return to clinic prn if these symptoms worsen or fail to improve as anticipated.  

## 2010-10-25 NOTE — Op Note (Signed)
[facility]  Patient:    Stephanie Gonzalez, NOURSE                          MRN: [id number] Proc. Date: 03/19/00 Attending:  Freddy Finner, M.D.                           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  OPERATION:                    1. Total abdominal hysterectomy.                               2. Bilateral salpingo-oophorectomy.                               3. Appendectomy.  SECONDARY DIAGNOSES::         Fibrous band at distal appendix suggesting potential risk of appendicitis.  SURGEON:                      Freddy Finner, M.D.  ANESTHESIA:                   General endotracheal anesthesia.  ESTIMATED BLOOD LOSS:         100 cc.  INTRAOPERATIVE COMPLICATIONS: None.  INDICATIONS:                  Details of present illness according to admission note.  DESCRIPTION OF PROCEDURE:     The patient was admitted on the morning of surgery, brought to the operating room, placed under adequate general endotracheal anesthesia, placed in the dorsal recumbent position.  Lower abdomen and vagina were prepped in the usual fashion.  Foley catheter was placed using sterile technique.  Sterile drapes were applied.  Lower abdominal transverse incision was made and carried sharply down to fascia.  Subcutaneous vessels were controlled with the Bovie.  Fascia was entered sharply and extended to the extent of skin incision.  Rectus sheath was developed superiorly and inferiorly with blunt and sharp dissection.  Rectus muscles divided in the midline.  Peritoneum was entered and extended bluntly and sharply to the extent of skin incision.  Exploration of the upper abdomen revealed no apparent abnormality.  Gallbladder was palpably normal.  Liver was normal.  Kidneys were normal.  Appendix was visualized,  and the distal tip was found to be partially obliterated.  Moist packs were used to pack intestinal contents out of the pelvis and then self-retraining retractor was used. Proximal broad ligaments were grasped with Kellys.  Infundibular, pelvic, and round ligaments were taken together as one pedicle because of the close proximity of the ovary to the round.  This pedicle was doubly ligated with free tie of 0 Monocryl followed by suture ligature.  Bladder was released. Endometriotic lesion was fulgurated on the anterior peritoneum at the junction of the bladder with the cervix.  After release, the bladder was carefully advanced off the cervix.  The vessel pedicles on each side were skeletonized, crossclamped, cut, divided sharply and ligated with 0 Monocryl.  Straight Heaneys were use to develop the  cardinal ligament.  Pedicles were divided sharply and ligated with 0 Monocryl.  Curved Heaneys were placed on the uterosacral with great attention to the rectum which was tented in the cul-de-sac.  This was close in proximity, but we were able to complete the dissection without significantly disturbing the rectum.  The cervix was then completely excised from the cuff.  The cuff was cleansed with figure-of-eight of 0 Monocryl.  Angles of the vagina were anchored to the uterosacrals on each side.  Additional suturing was required for complete hemostasis as well as coagulation with the Bovie.  Irrigation was carried out when hemostasis was complete.  The packs were removed.  The appendix visualized.  The mesoappendix was taken in one pedicle which was divided and ligated with free ties of 3-0 chromic.  The base of the appendix was crushed, doubly ligated with free ties of 0 Monocryl and the appendix excised.  Mucosa distal to the tie was fulgurated with the Bovie.  Hemostasis at all levels was complete.  After irrigation and appropriate ______ , the abdominal incision was closed; 0 Monocryl was used  to close the peritoneum and reapproximate the rectus muscles.  Fascia was closed with running 0 PDS. Subcutaneous tissue was approximated with interrupted 3-0 chromic.  Skin was closed with wide skin staples and 1/4 inch Steri-Strips.  The patient tolerated the procedure well and was taken to recovery in good condition. DD:  03/19/00 TD:  03/19/00 Job: [id number]

## 2010-10-25 NOTE — Assessment & Plan Note (Signed)
[facility]                                 ON-CALL NOTE   NAME:Stephanie Gonzalez, Stephanie Gonzalez                        MRN:          [id number]  DATE:08/01/2006                            DOB:          1940/10/26    Phone number [phone].  Patient of Dr. Amador Cunas.  Phone call at 8:59  a.m. on August 01, 2006.  She calls needing cough medicines.  She was  seen, was tight and having asthma-type symptoms, and got Duratuss  samples in the office, and wants some more.   PLAN:  I told her we do not really phone in prescriptions, and that  probably just has dextromethorphan as a cough suppressant.  I offered  her an appointment, and she decided to take that since she is tight and  wants to make sure she is feeling better over the next couple of days to  go back to work.  I gave her an appointment at 1:15.     Karie Schwalbe, MD  Electronically Signed    RIL/MedQ  DD: 08/01/2006  DT: 08/01/2006  Job #: [id number]   cc:   Gordy Savers, MD

## 2010-12-16 ENCOUNTER — Encounter: Payer: Self-pay | Admitting: Internal Medicine

## 2010-12-16 ENCOUNTER — Ambulatory Visit (INDEPENDENT_AMBULATORY_CARE_PROVIDER_SITE_OTHER): Payer: Medicare Other | Admitting: Internal Medicine

## 2010-12-16 DIAGNOSIS — M25551 Pain in right hip: Secondary | ICD-10-CM

## 2010-12-16 DIAGNOSIS — E119 Type 2 diabetes mellitus without complications: Secondary | ICD-10-CM

## 2010-12-16 DIAGNOSIS — M25559 Pain in unspecified hip: Secondary | ICD-10-CM

## 2010-12-16 DIAGNOSIS — M549 Dorsalgia, unspecified: Secondary | ICD-10-CM

## 2010-12-16 LAB — GLUCOSE, POCT (MANUAL RESULT ENTRY): POC Glucose: 153

## 2010-12-16 MED ORDER — METHYLPREDNISOLONE ACETATE 80 MG/ML IJ SUSP
80.0000 mg | Freq: Once | INTRAMUSCULAR | Status: AC
  Administered 2010-12-16: 80 mg via INTRAMUSCULAR

## 2010-12-16 NOTE — Patient Instructions (Signed)
Most patients with low back pain will improve with time over the next two to 6 weeks.  Keep active but avoid any activities that cause pain.  Apply moist heat to the low back area several times daily.  Call or return to clinic prn if these symptoms worsen or fail to improve as anticipated.  

## 2010-12-16 NOTE — Progress Notes (Signed)
  Subjective:    Patient ID: Stephanie Gonzalez, female    DOB: 01/11/1941, 70 y.o.   MRN: [id number]  HPI  70 year old patient seen today with a three-day history of back pain with radiation to the right hip. She had a similar episode 3 months ago. At that time she was suffering from shingles involving the right flank area. She had an abdominal and pelvic CT scan that revealed lumbar disc disease. Pain is identical. She has treated hypertension and diabetes.    Review of Systems  Constitutional: Negative.   HENT: Negative for hearing loss, congestion, sore throat, rhinorrhea, dental problem, sinus pressure and tinnitus.   Eyes: Negative for pain, discharge and visual disturbance.  Respiratory: Negative for cough and shortness of breath.   Cardiovascular: Negative for chest pain, palpitations and leg swelling.  Gastrointestinal: Negative for nausea, vomiting, abdominal pain, diarrhea, constipation, blood in stool and abdominal distention.  Genitourinary: Negative for dysuria, urgency, frequency, hematuria, flank pain, vaginal bleeding, vaginal discharge, difficulty urinating, vaginal pain and pelvic pain.  Musculoskeletal: Positive for back pain. Negative for joint swelling, arthralgias and gait problem.  Skin: Negative for rash.  Neurological: Negative for dizziness, syncope, speech difficulty, weakness, numbness and headaches.  Hematological: Negative for adenopathy.  Psychiatric/Behavioral: Negative for behavioral problems, dysphoric mood and agitation. The patient is not nervous/anxious.        Objective:   Physical Exam  Constitutional: She is oriented to person, place, and time. She appears well-developed and well-nourished.  HENT:  Head: Normocephalic.  Right Ear: External ear normal.  Left Ear: External ear normal.  Mouth/Throat: Oropharynx is clear and moist.  Eyes: Conjunctivae and EOM are normal. Pupils are equal, round, and reactive to light.  Neck: Normal range of motion. Neck  supple. No thyromegaly present.  Cardiovascular: Normal rate, regular rhythm, normal heart sounds and intact distal pulses.   Pulmonary/Chest: Effort normal and breath sounds normal.  Abdominal: Soft. Bowel sounds are normal. She exhibits no mass. There is no tenderness.  Musculoskeletal: Normal range of motion.       Straight leg test on the right did aggravate the lumbar and right hip discomfort Motor exam normal Full ankle flexion and extension  Lymphadenopathy:    She has no cervical adenopathy.  Neurological: She is alert and oriented to person, place, and time.  Skin: Skin is warm and dry. No rash noted.  Psychiatric: She has a normal mood and affect. Her behavior is normal.          Assessment & Plan:   Lumbar disc disease with sciatica. Will treat with Depo-Medrol rest warm compresses. Will continue analgesics. Will call if unimproved. Patient has been seen in the past by [facility] orthopedics Hypertension improved repeat blood pressure 130/80

## 2011-01-06 ENCOUNTER — Other Ambulatory Visit: Payer: Self-pay

## 2011-01-06 MED ORDER — CLONAZEPAM 1 MG PO TABS: 1.0000 mg | ORAL_TABLET | Freq: Every day | ORAL | Status: DC

## 2011-01-06 NOTE — Telephone Encounter (Signed)
Called in refill  

## 2011-01-21 ENCOUNTER — Other Ambulatory Visit: Payer: Self-pay

## 2011-01-21 MED ORDER — ATORVASTATIN CALCIUM 40 MG PO TABS: 40.0000 mg | ORAL_TABLET | Freq: Every day | ORAL | Status: DC

## 2011-01-21 MED ORDER — SERTRALINE HCL 100 MG PO TABS: 50.0000 mg | ORAL_TABLET | Freq: Every day | ORAL | Status: DC

## 2011-01-21 MED ORDER — AMLODIPINE BESYLATE 5 MG PO TABS: 5.0000 mg | ORAL_TABLET | Freq: Every day | ORAL | Status: DC

## 2011-01-21 NOTE — Telephone Encounter (Signed)
Faxed back to [facility] - [phone] fax , [phone] ph

## 2011-02-18 ENCOUNTER — Ambulatory Visit (INDEPENDENT_AMBULATORY_CARE_PROVIDER_SITE_OTHER): Payer: Medicare Other | Admitting: Internal Medicine

## 2011-02-18 DIAGNOSIS — Z0000 Encounter for general adult medical examination without abnormal findings: Secondary | ICD-10-CM

## 2011-02-18 DIAGNOSIS — Z23 Encounter for immunization: Secondary | ICD-10-CM

## 2011-03-28 ENCOUNTER — Encounter: Payer: Self-pay | Admitting: Internal Medicine

## 2011-03-28 ENCOUNTER — Ambulatory Visit (INDEPENDENT_AMBULATORY_CARE_PROVIDER_SITE_OTHER): Payer: Medicare Other | Admitting: Internal Medicine

## 2011-03-28 DIAGNOSIS — J069 Acute upper respiratory infection, unspecified: Secondary | ICD-10-CM

## 2011-03-28 DIAGNOSIS — E119 Type 2 diabetes mellitus without complications: Secondary | ICD-10-CM

## 2011-03-28 LAB — GLUCOSE, POCT (MANUAL RESULT ENTRY): POC Glucose: 141

## 2011-03-28 MED ORDER — ESTROGENS, CONJUGATED 0.625 MG/GM VA CREA: 0.5000 g | TOPICAL_CREAM | Freq: Every day | VAGINAL | Status: AC

## 2011-03-28 MED ORDER — HYDROCODONE-HOMATROPINE 5-1.5 MG/5ML PO SYRP: 5.0000 mL | ORAL_SOLUTION | Freq: Four times a day (QID) | ORAL | Status: AC | PRN

## 2011-03-28 NOTE — Progress Notes (Signed)
  Subjective:    Patient ID: Stephanie Gonzalez, female    DOB: [birth year], 70 y.o.   MRN: [id number]  HPI  70 year old patient who is seen with a two-day history of nonproductive cough. No fever chest pain or shortness of breath. Denies any wheezing. She has been on vacation this week and is concerned about being able to return to work on Monday.     Review of Systems  Constitutional: Negative.   HENT: Negative for hearing loss, congestion, sore throat, rhinorrhea, dental problem, sinus pressure and tinnitus.   Eyes: Negative for pain, discharge and visual disturbance.  Respiratory: Positive for cough (nonproductive). Negative for shortness of breath.   Cardiovascular: Negative for chest pain, palpitations and leg swelling.  Gastrointestinal: Negative for nausea, vomiting, abdominal pain, diarrhea, constipation, blood in stool and abdominal distention.  Genitourinary: Negative for dysuria, urgency, frequency, hematuria, flank pain, vaginal bleeding, vaginal discharge, difficulty urinating, vaginal pain and pelvic pain.  Musculoskeletal: Negative for joint swelling, arthralgias and gait problem.  Skin: Negative for rash.  Neurological: Negative for dizziness, syncope, speech difficulty, weakness, numbness and headaches.  Hematological: Negative for adenopathy.  Psychiatric/Behavioral: Negative for behavioral problems, dysphoric mood and agitation. The patient is not nervous/anxious.        Objective:   Physical Exam  Constitutional: She is oriented to person, place, and time. She appears well-developed and well-nourished.  HENT:  Head: Normocephalic.  Right Ear: External ear normal.  Left Ear: External ear normal.  Mouth/Throat: Oropharynx is clear and moist.  Eyes: Conjunctivae and EOM are normal. Pupils are equal, round, and reactive to light.  Neck: Normal range of motion. Neck supple. No thyromegaly present.  Cardiovascular: Normal rate, regular rhythm, normal heart sounds and intact  distal pulses.   Pulmonary/Chest: Effort normal and breath sounds normal.  Abdominal: Soft. Bowel sounds are normal. She exhibits no mass. There is no tenderness.  Musculoskeletal: Normal range of motion.  Lymphadenopathy:    She has no cervical adenopathy.  Neurological: She is alert and oriented to person, place, and time.  Skin: Skin is warm and dry. No rash noted.  Psychiatric: She has a normal mood and affect. Her behavior is normal.          Assessment & Plan:   Viral tracheobronchitis. We'll treat symptomatically prescription for an anti-tussive dispensed

## 2011-03-28 NOTE — Patient Instructions (Signed)
Get plenty of rest, Drink lots of  clear liquids, and use Tylenol or ibuprofen for fever and discomfort.

## 2011-04-01 ENCOUNTER — Encounter: Payer: Self-pay | Admitting: Internal Medicine

## 2011-04-01 ENCOUNTER — Ambulatory Visit (INDEPENDENT_AMBULATORY_CARE_PROVIDER_SITE_OTHER): Payer: Medicare Other | Admitting: Internal Medicine

## 2011-04-01 DIAGNOSIS — M545 Low back pain: Secondary | ICD-10-CM

## 2011-04-01 DIAGNOSIS — I10 Essential (primary) hypertension: Secondary | ICD-10-CM

## 2011-04-01 MED ORDER — DIAZEPAM 5 MG PO TABS: 5.0000 mg | ORAL_TABLET | Freq: Four times a day (QID) | ORAL | Status: DC | PRN

## 2011-04-01 MED ORDER — HYDROCODONE-ACETAMINOPHEN 5-500 MG PO TABS: 1.0000 | ORAL_TABLET | Freq: Four times a day (QID) | ORAL | Status: DC | PRN

## 2011-04-01 MED ORDER — METHYLPREDNISOLONE ACETATE 80 MG/ML IJ SUSP
80.0000 mg | Freq: Once | INTRAMUSCULAR | Status: AC
  Administered 2011-04-01: 80 mg via INTRAMUSCULAR

## 2011-04-01 NOTE — Patient Instructions (Addendum)
Most patients with low back pain will improve with time over the next two to 6 weeks.  Keep active but avoid any activities that cause pain.  Apply moist heat to the low back area several times daily.  Call or return to clinic prn if these symptoms worsen or fail to improve as anticipated. Back Pain, Adult Back pain is very common. The pain often gets better over time. The cause of back pain is usually not dangerous. Most people can learn to manage their back pain on their own.   HOME CARE    Stay active. Start with short walks on flat ground if you can. Try to walk farther each day.     Do not sit, drive, or stand in one place for more than 30 minutes. Do not stay in bed.     Do not avoid exercise or work. Activity can help your back heal faster.     Be careful when you bend or lift an object. Bend at your knees, keep the object close to you, and do not twist.     Sleep on a firm mattress. Lie on your side, and bend your knees. If you lie on your back, put a pillow under your knees.     Only take medicines as told by your doctor.     Put ice on the injured area.     Put ice in a plastic bag.     Place a towel between your skin and the bag.     Leave the ice on for 15 to 20 minutes, 3 to 4 times a day for the first 2 to 3 days. After that, you can switch between ice and heat packs.     Ask your doctor about back exercises or massage.     Avoid feeling anxious or stressed. Find good ways to deal with stress, such as exercise.  GET HELP RIGHT AWAY IF:    Your pain does not go away with rest or medicine.     Your pain does not go away in 1 week.     You have new problems.     You do not feel well.     The pain spreads into your legs.     You cannot control when you poop (bowel movement) or pee (urinate).     Your arms or legs feel weak or lose feeling (numbness).     You feel sick to your stomach (nauseous) or throw up (vomit).     You have belly (abdominal) pain.      You feel like you may pass out (faint).  MAKE SURE YOU:    Understand these instructions.     Will watch your condition.     Will get help right away if you are not doing well or get worse.  Document Released: 11/12/2007 Document Revised: 02/05/2011 Document Reviewed: 10/14/2010 [facility] Patient Information 2012 [place], [place].

## 2011-04-01 NOTE — Progress Notes (Signed)
  Subjective:    Patient ID: Stephanie Gonzalez, female    DOB: 1941-05-23, 70 y.o.   MRN: [id number]  HPI  70 year old patient who has a history of lumbar disc disease. Last night she had the onset of the right flank and lumbar pain. Pain is radiated to the right hip and right upper leg area. She has had 2 previous episodes 3 months and 6 months ago that responded well to Depo-Medrol therapy. She does have a history of type 2 diabetes. She has been quite uncomfortable. She has been unable to work today due to the discomfort. No motor symptoms    Review of Systems  Constitutional: Negative.   HENT: Negative for hearing loss, congestion, sore throat, rhinorrhea, dental problem, sinus pressure and tinnitus.   Eyes: Negative for pain, discharge and visual disturbance.  Respiratory: Negative for cough and shortness of breath.   Cardiovascular: Negative for chest pain, palpitations and leg swelling.  Gastrointestinal: Negative for nausea, vomiting, abdominal pain, diarrhea, constipation, blood in stool and abdominal distention.  Genitourinary: Negative for dysuria, urgency, frequency, hematuria, flank pain, vaginal bleeding, vaginal discharge, difficulty urinating, vaginal pain and pelvic pain.  Musculoskeletal: Positive for back pain. Negative for joint swelling, arthralgias and gait problem.  Skin: Negative for rash.  Neurological: Negative for dizziness, syncope, speech difficulty, weakness, numbness and headaches.  Hematological: Negative for adenopathy.  Psychiatric/Behavioral: Negative for behavioral problems, dysphoric mood and agitation. The patient is not nervous/anxious.        Objective:   Physical Exam  Constitutional: She appears well-developed and well-nourished. No distress.       Uncomfortable but in no acute distress. Blood pressure repeat 140/84  Musculoskeletal: Normal range of motion. She exhibits no edema and no tenderness.       Suggestion of mild discomfort in the lumbar area  centrally Straight leg test was negative bilaterally Range of motion of both hips appear to be intact and without pain          Assessment & Plan:   Low back pain, secondary to lumbar disc disease. She has responded to identical symptoms in the past with Depo-Medrol. We'll give 80 mg IM. Her medications refilled. She has been asked to stay at her work for the next 2 days and rest. A note dictated on her behalf

## 2011-04-01 NOTE — Progress Notes (Signed)
Addended by: Duard Brady I on: 04/01/2011 03:31 PM   Modules accepted: Orders

## 2011-04-03 ENCOUNTER — Telehealth: Payer: Self-pay

## 2011-04-03 NOTE — Telephone Encounter (Signed)
Take Aleve 200 mg twice daily for pain or swelling  Patient will need additional time to recover completely. If patient needs a letter to excuse from work for additional time please supply

## 2011-04-03 NOTE — Telephone Encounter (Signed)
Pt called back to check on status of her previous call. Pt has been informed with info given by Dr Amador Cunas below. Pt said that she will try the Alleve as suggested and if not any better in a day or so, she will call back.

## 2011-04-03 NOTE — Telephone Encounter (Signed)
Pt called and stated she was seen earlier this week for sciatic nerve pain and was given a shot and medication.  Pt states she would like to know if she should be better by now because she has improved but the pain has not gone away and she is still unable to go to work.  Pls advise.

## 2011-04-07 ENCOUNTER — Telehealth: Payer: Self-pay | Admitting: Internal Medicine

## 2011-04-07 DIAGNOSIS — M5440 Lumbago with sciatica, unspecified side: Secondary | ICD-10-CM

## 2011-04-07 NOTE — Telephone Encounter (Signed)
Pt is calling back and wants to see a neurologist or have MRI for possible disc disease ASAP.

## 2011-04-07 NOTE — Telephone Encounter (Signed)
Pt called and was in to get a shot for sciatic pain in hip. Pt is having a lot of pain and is req to get a referral asap for Neurologist asap. Pt can't go to work. Pt is aware that pcp is out of office this wk. Pls call today.

## 2011-04-07 NOTE — Telephone Encounter (Signed)
Try to get in with Dr Ethelene Hal with [facility] orthopedics.

## 2011-04-23 ENCOUNTER — Telehealth: Payer: Self-pay | Admitting: *Deleted

## 2011-04-23 NOTE — Telephone Encounter (Signed)
This is a Dr. Amador Cunas patient

## 2011-04-23 NOTE — Telephone Encounter (Signed)
Pt needs a call ASAP to send a [place] note to keep her out of work from the 23 of October until the 23 rd of November.  Please call her ASAP to see if Dr. Clent Ridges can get the MRI results.

## 2011-04-24 NOTE — Telephone Encounter (Signed)
Spoke with pt - letter will be done tomorrow and faxed to work

## 2011-04-24 NOTE — Telephone Encounter (Signed)
Please advise - on work note - I think the MRI was done by ortho.I dont see any results

## 2011-04-24 NOTE — Telephone Encounter (Signed)
Ok for note 

## 2011-04-25 ENCOUNTER — Telehealth: Payer: Self-pay | Admitting: Internal Medicine

## 2011-04-25 NOTE — Telephone Encounter (Signed)
Pt had paper work dropped off and was faxed to [employer] and there is a discrepancy in the paper work and pt needs corrected in order to keep her job. Pt said the dates on paperwork need to be corrected. Pt requesting you contact her asap.

## 2011-04-28 NOTE — Telephone Encounter (Signed)
Letter done and faxed. KIK

## 2011-04-28 NOTE — Telephone Encounter (Signed)
Pt said that the form that was sent to [employer] is incorrect. Form should read that pt is under doctors care from 04/01/11 to 04/09/11 continuously. Pt has not returned to work and is sch to rcv a shot in pts spine tomorrow by Dr Ethelene Hal with [facility]. Pt req call back asap. Pt will lose her job if this is noted corrected asap. Pls send to [employer] Fax # [phone].

## 2011-05-12 ENCOUNTER — Other Ambulatory Visit: Payer: Self-pay | Admitting: Internal Medicine

## 2011-05-12 MED ORDER — HYDROCODONE-ACETAMINOPHEN 5-500 MG PO TABS: 1.0000 | ORAL_TABLET | Freq: Four times a day (QID) | ORAL | Status: DC | PRN

## 2011-05-12 NOTE — Telephone Encounter (Signed)
#  50 and

## 2011-05-12 NOTE — Telephone Encounter (Signed)
Called in.

## 2011-05-12 NOTE — Telephone Encounter (Signed)
Last written 04/01/11  # 50 2Rf  Please advise

## 2011-05-12 NOTE — Telephone Encounter (Signed)
Pharmacy requesting refill on HYDROcodone-acetaminophen (VICODIN) 5-500 MG per tablet for pt

## 2011-05-27 ENCOUNTER — Other Ambulatory Visit: Payer: Self-pay

## 2011-05-27 MED ORDER — HYDROCODONE-HOMATROPINE 5-1.5 MG/5ML PO SYRP: 5.0000 mL | ORAL_SOLUTION | Freq: Four times a day (QID) | ORAL | Status: AC | PRN

## 2011-05-27 NOTE — Telephone Encounter (Signed)
[facility] 

## 2011-05-27 NOTE — Telephone Encounter (Signed)
Hydromet 6 ounces 1 teaspoon  every 6 hours as needed for cough and congestion 

## 2011-05-27 NOTE — Telephone Encounter (Signed)
[facility]cod[facility] -  Last seen 04/01/11  Last written 03/28/11 Please advise

## 2011-06-11 ENCOUNTER — Other Ambulatory Visit: Payer: Self-pay | Admitting: Internal Medicine

## 2011-06-11 MED ORDER — HYDROCODONE-HOMATROPINE 5-1.5 MG/5ML PO SYRP: 5.0000 mL | ORAL_SOLUTION | Freq: Three times a day (TID) | ORAL | Status: AC | PRN

## 2011-06-11 NOTE — Telephone Encounter (Signed)
Hydromet 6 ounces 1 teaspoon  every 6 hours as needed for cough and congestion 

## 2011-06-11 NOTE — Telephone Encounter (Signed)
Last rx'd 05/27/11 0Rf Please advise

## 2011-06-11 NOTE — Telephone Encounter (Signed)
[facility]---ph---[phone]

## 2011-08-06 ENCOUNTER — Other Ambulatory Visit: Payer: Self-pay | Admitting: Internal Medicine

## 2011-08-06 DIAGNOSIS — Z1231 Encounter for screening mammogram for malignant neoplasm of breast: Secondary | ICD-10-CM

## 2011-08-28 ENCOUNTER — Ambulatory Visit (HOSPITAL_COMMUNITY)
Admission: RE | Admit: 2011-08-28 | Discharge: 2011-08-28 | Disposition: A | Discharge status: Home / Self Care | Payer: Medicare Other | Source: Ambulatory Visit | Attending: Internal Medicine | Admitting: Internal Medicine

## 2011-08-28 DIAGNOSIS — Z1231 Encounter for screening mammogram for malignant neoplasm of breast: Secondary | ICD-10-CM

## 2011-09-03 ENCOUNTER — Other Ambulatory Visit: Payer: Self-pay

## 2011-09-03 MED ORDER — CLONAZEPAM 1 MG PO TABS: 1.0000 mg | ORAL_TABLET | Freq: Every day | ORAL | Status: DC

## 2011-10-15 ENCOUNTER — Encounter: Payer: Self-pay | Admitting: Family

## 2011-10-15 ENCOUNTER — Ambulatory Visit (INDEPENDENT_AMBULATORY_CARE_PROVIDER_SITE_OTHER): Payer: Medicare Other | Admitting: Family

## 2011-10-15 VITALS — BP 112/80 | Temp 98.3°F | Wt 184.0 lb

## 2011-10-15 DIAGNOSIS — J209 Acute bronchitis, unspecified: Secondary | ICD-10-CM

## 2011-10-15 DIAGNOSIS — R05 Cough: Secondary | ICD-10-CM

## 2011-10-15 MED ORDER — GUAIFENESIN-CODEINE 100-10 MG/5ML PO SYRP: 5.0000 mL | ORAL_SOLUTION | Freq: Three times a day (TID) | ORAL | Status: AC | PRN

## 2011-10-15 MED ORDER — METHYLPREDNISOLONE 4 MG PO KIT: PACK | ORAL | Status: AC

## 2011-10-15 NOTE — Patient Instructions (Signed)

## 2011-10-15 NOTE — Progress Notes (Signed)
Subjective:    Patient ID: Stephanie Gonzalez, female    DOB: Jan 23, 1941, 71 y.o.   MRN: [id number]  HPI 71 year old white female, nonsmoker, patient of Dr. Kirtland Bouchard. is in today with complaints of cough, congestion x4 days. She's been taken Mucinex and Delsym with no relief. Reports being out of work the last 3 days and is requesting a note to go back to work. She denies any lightheadedness, dizziness, chest pain, palpitations, shortness of breath or edema.    Review of Systems  Constitutional: Negative.   HENT: Positive for congestion.   Respiratory: Positive for cough and wheezing.   Gastrointestinal: Negative.   Musculoskeletal: Negative.   Skin: Negative.   Neurological: Negative.   Hematological: Negative.   Psychiatric/Behavioral: Negative.    Past Medical History  Diagnosis Date  . ASTHMA 10/06/2007  . BURSITIS, RIGHT HIP 02/04/2010  . DIABETES MELLITUS, TYPE II 11/01/2009  . HYPERLIPIDEMIA 10/06/2007  . HYPERTENSION 10/06/2007    History   Social History  . Marital Status: Married    Spouse Name: N/A    Number of Children: N/A  . Years of Education: N/A   Occupational History  . Not on file.   Social History Main Topics  . Smoking status: Never Smoker   . Smokeless tobacco: Never Used  . Alcohol Use: Yes     rarely  . Drug Use: No  . Sexually Active: Not on file   Other Topics Concern  . Not on file   Social History Narrative  . No narrative on file    Past Surgical History  Procedure Date  . Appendectomy   . Cholecystectomy   . Abdominal hysterectomy   . Tubal ligation   . Tonsillectomy     No family history on file.  Allergies  Allergen Reactions  . Tetracycline     REACTION: rash, agitation    Current Outpatient Prescriptions on File Prior to Visit  Medication Sig Dispense Refill  . albuterol (PROAIR HFA) 108 (90 BASE) MCG/ACT inhaler Inhale 2 puffs into the lungs every 6 (six) hours as needed.        Marland Kitchen amLODipine (NORVASC) 5 MG tablet Take 1  tablet (5 mg total) by mouth daily.  90 tablet  3  . atorvastatin (LIPITOR) 40 MG tablet Take 1 tablet (40 mg total) by mouth daily.  90 tablet  3  . clonazePAM (KLONOPIN) 1 MG tablet Take 1 tablet (1 mg total) by mouth at bedtime.  90 tablet  1  . conjugated estrogens (PREMARIN) vaginal cream Place 0.5 Applicatorfuls vaginally daily.  42.5 g  1  . diazepam (VALIUM) 5 MG tablet Take 1 tablet (5 mg total) by mouth every 6 (six) hours as needed. From ER visit  30 tablet  1  . glucose blood (FREESTYLE LITE) test strip 1 each by Other route daily. Use as instructed       . metFORMIN (GLUCOPHAGE) 500 MG tablet Take 500 mg by mouth 2 (two) times daily with a meal.        . sertraline (ZOLOFT) 100 MG tablet Take 0.5 tablets (50 mg total) by mouth daily. 1/2 tablet daily  45 tablet  3  . gabapentin (NEURONTIN) 100 MG capsule Take 1 capsule (100 mg total) by mouth 3 (three) times daily.  90 capsule  2  . HYDROcodone-homatropine (HYCODAN) 5-1.5 MG/5ML syrup Take 5 mLs by mouth every 6 (six) hours as needed for cough.  120 mL  0  . HYDROcodone-homatropine (HYCODAN) 5-1.5  MG/5ML syrup Take 5 mLs by mouth every 6 (six) hours as needed for cough.  120 mL  0  . HYDROcodone-homatropine (HYCODAN) 5-1.5 MG/5ML syrup Take 5 mLs by mouth every 8 (eight) hours as needed for cough.  120 mL  0    BP 112/80  Temp(Src) 98.3 F (36.8 C) (Oral)  Wt 184 lb (83.462 kg)chart    Objective:   Physical Exam  Constitutional: She is oriented to person, place, and time. She appears well-developed and well-nourished.  HENT:  Right Ear: External ear normal.  Left Ear: External ear normal.  Nose: Nose normal.  Mouth/Throat: Oropharynx is clear and moist.  Neck: Normal range of motion. Neck supple.  Cardiovascular: Normal rate, regular rhythm and normal heart sounds.   Pulmonary/Chest: Effort normal and breath sounds normal.  Abdominal: Soft. Bowel sounds are normal.  Musculoskeletal: Normal range of motion.  Neurological:  She is alert and oriented to person, place, and time.  Skin: Skin is dry.  Psychiatric: She has a normal mood and affect.          Assessment & Plan:  Assessment: Bronchitis, cough  Plan: Advised patient that she may return to work tomorrow. However, she requests to go back to work on Monday, 10/20/2011. Medrol Dosepak as directed. Robitussin with codeine 1 teaspoon every 6 hours when necessary cough. Warned of drowsiness. Patient call the office if symptoms worsen or persist. Recheck a schedule, when necessary.

## 2011-10-24 ENCOUNTER — Encounter: Payer: Self-pay | Admitting: Internal Medicine

## 2011-10-24 ENCOUNTER — Ambulatory Visit (INDEPENDENT_AMBULATORY_CARE_PROVIDER_SITE_OTHER): Payer: Medicare Other | Admitting: Internal Medicine

## 2011-10-24 VITALS — BP 130/70 | HR 58 | Temp 97.4°F | Resp 20 | Ht 64.0 in | Wt 191.0 lb

## 2011-10-24 DIAGNOSIS — E119 Type 2 diabetes mellitus without complications: Secondary | ICD-10-CM

## 2011-10-24 DIAGNOSIS — I10 Essential (primary) hypertension: Secondary | ICD-10-CM

## 2011-10-24 DIAGNOSIS — IMO0001 Reserved for inherently not codable concepts without codable children: Secondary | ICD-10-CM

## 2011-10-24 DIAGNOSIS — J45909 Unspecified asthma, uncomplicated: Secondary | ICD-10-CM

## 2011-10-24 DIAGNOSIS — E785 Hyperlipidemia, unspecified: Secondary | ICD-10-CM

## 2011-10-24 DIAGNOSIS — Z136 Encounter for screening for cardiovascular disorders: Secondary | ICD-10-CM

## 2011-10-24 DIAGNOSIS — Z0000 Encounter for general adult medical examination without abnormal findings: Secondary | ICD-10-CM

## 2011-10-24 LAB — COMPREHENSIVE METABOLIC PANEL
Albumin: 3.6 g/dL (ref 3.5–5.2)
BUN: 15 mg/dL (ref 6–23)
CO2: 28 mEq/L (ref 19–32)
GFR: 91.03 mL/min (ref >60.00)
Glucose, Bld: 91 mg/dL (ref 70–99)
Sodium: 138 mEq/L (ref 135–145)
Total Bilirubin: 0.4 mg/dL (ref 0.3–1.2)
Total Protein: 6.8 g/dL (ref 6.0–8.3)

## 2011-10-24 LAB — CBC WITH DIFFERENTIAL/PLATELET
Basophils Absolute: 0 10*3/uL (ref 0.0–0.1)
Hemoglobin: 13.1 g/dL (ref 12.0–15.0)
Lymphocytes Relative: 39.8 % (ref 12.0–46.0)
Monocytes Relative: 6 % (ref 3.0–12.0)
Platelets: 222 10*3/uL (ref 150.0–400.0)
RDW: 13.5 % (ref 11.5–14.6)
WBC: 10.5 10*3/uL (ref 4.5–10.5)

## 2011-10-24 LAB — LIPID PANEL
Cholesterol: 269 mg/dL — ABNORMAL HIGH (ref 0–200)
Triglycerides: 1101 mg/dL — ABNORMAL HIGH (ref 0.0–149.0)

## 2011-10-24 LAB — TSH: TSH: 1.38 u[IU]/mL (ref 0.35–5.50)

## 2011-10-24 LAB — HM DIABETES FOOT EXAM

## 2011-10-24 LAB — HEMOGLOBIN A1C: Hgb A1c MFr Bld: 5.5 % (ref 4.6–6.5)

## 2011-10-24 NOTE — Patient Instructions (Signed)
It is important that you exercise regularly, at least 20 minutes 3 to 4 times per week.  If you develop chest pain or shortness of breath seek  medical attention.   Please check your hemoglobin A1c every 3 months  You need to lose weight.  Consider a lower calorie diet and regular exercise. 

## 2011-10-24 NOTE — Progress Notes (Signed)
Subjective:    Patient ID: Stephanie Gonzalez, female    DOB: 08-Aug-1940, 71 y.o.   MRN: [id number]  HPI  71 year old patient who is seen today for a preventive health examination. She has a history of type 2 diabetes controlled on metformin therapy. She has hypertension and dyslipidemia. She has done quite well. She has recently acquired an epidural due to chronic low back pain. She has a history of shingles nephrolithiasis. No new concerns or complaints today  Past Medical History  Diagnosis Date  . ASTHMA 10/06/2007  . BURSITIS, RIGHT HIP 02/04/2010  . DIABETES MELLITUS, TYPE II 11/01/2009  . HYPERLIPIDEMIA 10/06/2007  . HYPERTENSION 10/06/2007    History   Social History  . Marital Status: Married    Spouse Name: N/A    Number of Children: N/A  . Years of Education: N/A   Occupational History  . Not on file.   Social History Main Topics  . Smoking status: Never Smoker   . Smokeless tobacco: Never Used  . Alcohol Use: Yes     rarely  . Drug Use: No  . Sexually Active: Not on file   Other Topics Concern  . Not on file   Social History Narrative  . No narrative on file    Past Surgical History  Procedure Date  . Appendectomy   . Cholecystectomy   . Abdominal hysterectomy   . Tubal ligation   . Tonsillectomy     No family history on file.  Allergies  Allergen Reactions  . Tetracycline     REACTION: rash, agitation    Current Outpatient Prescriptions on File Prior to Visit  Medication Sig Dispense Refill  . albuterol (PROAIR HFA) 108 (90 BASE) MCG/ACT inhaler Inhale 2 puffs into the lungs every 6 (six) hours as needed.        Marland Kitchen amLODipine (NORVASC) 5 MG tablet Take 1 tablet (5 mg total) by mouth daily.  90 tablet  3  . atorvastatin (LIPITOR) 40 MG tablet Take 1 tablet (40 mg total) by mouth daily.  90 tablet  3  . clonazePAM (KLONOPIN) 1 MG tablet Take 1 tablet (1 mg total) by mouth at bedtime.  90 tablet  1  . conjugated estrogens (PREMARIN) vaginal cream Place  0.5 Applicatorfuls vaginally daily.  42.5 g  1  . diazepam (VALIUM) 5 MG tablet Take 1 tablet (5 mg total) by mouth every 6 (six) hours as needed. From ER visit  30 tablet  1  . glucose blood (FREESTYLE LITE) test strip 1 each by Other route daily. Use as instructed       . guaiFENesin-codeine (ROBITUSSIN AC) 100-10 MG/5ML syrup Take 5 mLs by mouth 3 (three) times daily as needed for cough.  120 mL  0  . metFORMIN (GLUCOPHAGE) 500 MG tablet Take 500 mg by mouth 2 (two) times daily with a meal.        . sertraline (ZOLOFT) 100 MG tablet Take 0.5 tablets (50 mg total) by mouth daily. 1/2 tablet daily  45 tablet  3  . gabapentin (NEURONTIN) 100 MG capsule Take 1 capsule (100 mg total) by mouth 3 (three) times daily.  90 capsule  2    BP 130/70  Pulse 58  Temp(Src) 97.4 F (36.3 C) (Oral)  Resp 20  Ht 5\' 4"  (1.626 m)  Wt 191 lb (86.637 kg)  BMI 32.79 kg/m2  SpO2 98%   1. Risk factors, based on past  M,S,F history-  cardiovascular risk factors include  type 2 diabetes hypertension and dyslipidemia.  2.  Physical activities: Remains fairly active remains full time employed ; works with [employer] as a Scientist, physiological  3.  Depression/mood: History of anxiety depression which has been stable  4.  Hearing: No deficits  5.  ADL's: Independent in all aspects of daily living  6.  Fall risk: Low  7.  Home safety: No problems identified  8.  Height weight, and visual acuity; height and weight stable no change in visual acuity has been told recently that she has early cataracts and mild macular degeneration  9.  Counseling: Weight loss exercise encouraged  10. Lab orders based on risk factors: Laboratory profile including hemoglobin A1c and lipid panel will be reviewed  11. Referral : Not appropriate at this time  12. Care plan: weight loss exercise recommended heart healthy diet recommended  13. Cognitive assessment: Alert and oriented with normal affect. No cognitive  dysfunction.       Review of Systems  Constitutional: Negative.   HENT: Negative for hearing loss, congestion, sore throat, rhinorrhea, dental problem, sinus pressure and tinnitus.   Eyes: Negative for pain, discharge and visual disturbance.  Respiratory: Negative for cough and shortness of breath.   Cardiovascular: Negative for chest pain, palpitations and leg swelling.  Gastrointestinal: Negative for nausea, vomiting, abdominal pain, diarrhea, constipation, blood in stool and abdominal distention.  Genitourinary: Negative for dysuria, urgency, frequency, hematuria, flank pain, vaginal bleeding, vaginal discharge, difficulty urinating, vaginal pain and pelvic pain.  Musculoskeletal: Positive for back pain. Negative for joint swelling, arthralgias and gait problem.  Skin: Negative for rash.  Neurological: Negative for dizziness, syncope, speech difficulty, weakness, numbness and headaches.  Hematological: Negative for adenopathy.  Psychiatric/Behavioral: Negative for behavioral problems, dysphoric mood and agitation. The patient is not nervous/anxious.        Objective:   Physical Exam  Constitutional: She is oriented to person, place, and time. She appears well-developed and well-nourished.  HENT:  Head: Normocephalic and atraumatic.  Right Ear: External ear normal.  Left Ear: External ear normal.  Mouth/Throat: Oropharynx is clear and moist.  Eyes: Conjunctivae and EOM are normal.  Neck: Normal range of motion. Neck supple. No JVD present. No thyromegaly present.  Cardiovascular: Normal rate, regular rhythm, normal heart sounds and intact distal pulses.   No murmur heard. Pulmonary/Chest: Effort normal and breath sounds normal. She has no wheezes. She has no rales.  Abdominal: Soft. Bowel sounds are normal. She exhibits no distension and no mass. There is no tenderness. There is no rebound and no guarding.  Musculoskeletal: Normal range of motion. She exhibits no edema and no  tenderness.  Neurological: She is alert and oriented to person, place, and time. She has normal reflexes. No cranial nerve deficit. She exhibits normal muscle tone. Coordination normal.  Skin: Skin is warm and dry. No rash noted.  Psychiatric: She has a normal mood and affect. Her behavior is normal.          Assessment & Plan:   Preventive health exam Diabetes mellitus. We'll check a hemoglobin A1c Dyslipidemia. We'll check a lipid profile Hypertension well controlled

## 2011-10-27 NOTE — Progress Notes (Signed)
Quick Note:  Spoke woth pt - informed of labs and asked about meds - she states she had stopped most of them - including lipitor - We discussed high fiber , low fat low cholesterol diet - no fried foods , no fast foods, lean red meat only once. TO restart med today. ______

## 2011-11-07 ENCOUNTER — Other Ambulatory Visit: Payer: Self-pay

## 2011-11-07 MED ORDER — GUAIFENESIN-CODEINE 100-10 MG/5ML PO SYRP: 5.0000 mL | ORAL_SOLUTION | Freq: Three times a day (TID) | ORAL | Status: AC | PRN

## 2011-11-07 NOTE — Telephone Encounter (Signed)
6 oz  RF 2

## 2011-11-07 NOTE — Telephone Encounter (Signed)
Fax refill request from [facility] for cheratussin ac Last seen 10/24/11 cpx Last written 10/15/11 (P.Campbell) 0Rf Please advise

## 2011-11-07 NOTE — Telephone Encounter (Signed)
Called in.

## 2012-02-02 ENCOUNTER — Other Ambulatory Visit: Payer: Self-pay

## 2012-02-02 MED ORDER — CLONAZEPAM 1 MG PO TABS: 1.0000 mg | ORAL_TABLET | Freq: Every day | ORAL | Status: DC

## 2012-02-03 ENCOUNTER — Telehealth: Payer: Self-pay | Admitting: Internal Medicine

## 2012-02-03 NOTE — Telephone Encounter (Signed)
Pt requesting a refill on clonazePAM (KLONOPIN) 1 MG tablet  Pt states that pharmacy is telling her she should have 10-20 more and will not refill. Pt states that she has only taken one a night to help her sleep.    [phone]

## 2012-02-03 NOTE — Telephone Encounter (Signed)
Spoke with pt- informed we have called in RF but if not time/too early ins will not pay.

## 2012-03-15 ENCOUNTER — Encounter: Payer: Self-pay | Admitting: Internal Medicine

## 2012-03-15 ENCOUNTER — Ambulatory Visit (INDEPENDENT_AMBULATORY_CARE_PROVIDER_SITE_OTHER): Payer: Medicare Other | Admitting: Internal Medicine

## 2012-03-15 VITALS — BP 150/100 | Temp 98.1°F | Wt 187.0 lb

## 2012-03-15 DIAGNOSIS — I10 Essential (primary) hypertension: Secondary | ICD-10-CM

## 2012-03-15 DIAGNOSIS — J069 Acute upper respiratory infection, unspecified: Secondary | ICD-10-CM

## 2012-03-15 DIAGNOSIS — J45909 Unspecified asthma, uncomplicated: Secondary | ICD-10-CM

## 2012-03-15 MED ORDER — HYDROCODONE-ACETAMINOPHEN 10-325 MG PO TABS: 1.0000 | ORAL_TABLET | Freq: Four times a day (QID) | ORAL | Status: DC | PRN

## 2012-03-15 MED ORDER — METHYLPREDNISOLONE ACETATE 40 MG/ML IJ SUSP
40.0000 mg | Freq: Once | INTRAMUSCULAR | Status: AC
  Administered 2012-03-15: 40 mg via INTRAMUSCULAR

## 2012-03-15 NOTE — Progress Notes (Signed)
Subjective:    Patient ID: Stephanie Gonzalez, female    DOB: [birth year], 71 y.o.   MRN: [id number]  HPI  71 year old patient who has a history of asthma hypertension and diabetes. She awoke yesterday morning he'll and over the past 2 days has had worsening cough congestion and intermittent wheezing she has felt unwell with weakness and occasional chills. She is maintain good glycemic control no sputum production. Other symptoms include hoarseness  Past Medical History  Diagnosis Date  . ASTHMA 10/06/2007  . BURSITIS, RIGHT HIP 02/04/2010  . DIABETES MELLITUS, TYPE II 11/01/2009  . HYPERLIPIDEMIA 10/06/2007  . HYPERTENSION 10/06/2007    History   Social History  . Marital Status: Married    Spouse Name: N/A    Number of Children: N/A  . Years of Education: N/A   Occupational History  . Not on file.   Social History Main Topics  . Smoking status: Never Smoker   . Smokeless tobacco: Never Used  . Alcohol Use: Yes     rarely  . Drug Use: No  . Sexually Active: Not on file   Other Topics Concern  . Not on file   Social History Narrative  . No narrative on file    Past Surgical History  Procedure Date  . Appendectomy   . Cholecystectomy   . Abdominal hysterectomy   . Tubal ligation   . Tonsillectomy     No family history on file.  Allergies  Allergen Reactions  . Tetracycline     REACTION: rash, agitation    Current Outpatient Prescriptions on File Prior to Visit  Medication Sig Dispense Refill  . albuterol (PROAIR HFA) 108 (90 BASE) MCG/ACT inhaler Inhale 2 puffs into the lungs every 6 (six) hours as needed.        Marland Kitchen amLODipine (NORVASC) 5 MG tablet Take 1 tablet (5 mg total) by mouth daily.  90 tablet  3  . atorvastatin (LIPITOR) 40 MG tablet Take 1 tablet (40 mg total) by mouth daily.  90 tablet  3  . clonazePAM (KLONOPIN) 1 MG tablet Take 1 tablet (1 mg total) by mouth at bedtime.  90 tablet  1  . conjugated estrogens (PREMARIN) vaginal cream Place 0.5  Applicatorfuls vaginally daily.  42.5 g  1  . diazepam (VALIUM) 5 MG tablet Take 1 tablet (5 mg total) by mouth every 6 (six) hours as needed. From ER visit  30 tablet  1  . glucose blood (FREESTYLE LITE) test strip 1 each by Other route daily. Use as instructed       . HYDROcodone-acetaminophen (NORCO) 10-325 MG per tablet       . metFORMIN (GLUCOPHAGE) 500 MG tablet Take 500 mg by mouth 2 (two) times daily with a meal.        . sertraline (ZOLOFT) 100 MG tablet Take 0.5 tablets (50 mg total) by mouth daily. 1/2 tablet daily  45 tablet  3  . DISCONTD: gabapentin (NEURONTIN) 100 MG capsule Take 1 capsule (100 mg total) by mouth 3 (three) times daily.  90 capsule  2    BP 150/100  Temp 98.1 F (36.7 C) (Oral)  Wt 187 lb (84.823 kg)  SpO2 95%       Review of Systems  Constitutional: Positive for fever and chills.  HENT: Negative for hearing loss, congestion, sore throat, rhinorrhea, dental problem, sinus pressure and tinnitus.   Eyes: Negative for pain, discharge and visual disturbance.  Respiratory: Positive for wheezing. Negative for cough  and shortness of breath.   Cardiovascular: Negative for chest pain, palpitations and leg swelling.  Gastrointestinal: Negative for nausea, vomiting, abdominal pain, diarrhea, constipation, blood in stool and abdominal distention.  Genitourinary: Negative for dysuria, urgency, frequency, hematuria, flank pain, vaginal bleeding, vaginal discharge, difficulty urinating, vaginal pain and pelvic pain.  Musculoskeletal: Positive for arthralgias. Negative for joint swelling and gait problem.  Skin: Negative for rash.  Neurological: Negative for dizziness, syncope, speech difficulty, weakness, numbness and headaches.  Hematological: Negative for adenopathy.  Psychiatric/Behavioral: Negative for behavioral problems, dysphoric mood and agitation. The patient is not nervous/anxious.        Objective:   Physical Exam  Constitutional: She is oriented to  person, place, and time. She appears well-developed and well-nourished.       Repeat blood pressure 140/80. Temp 98.1  HENT:  Head: Normocephalic.  Right Ear: External ear normal.  Left Ear: External ear normal.  Mouth/Throat: Oropharynx is clear and moist.  Eyes: Conjunctivae normal and EOM are normal. Pupils are equal, round, and reactive to light.  Neck: Normal range of motion. Neck supple. No thyromegaly present.  Cardiovascular: Normal rate, regular rhythm, normal heart sounds and intact distal pulses.   Pulmonary/Chest: Effort normal and breath sounds normal. She has no wheezes.       O2 saturation 97 pulse rate 66  Abdominal: Soft. Bowel sounds are normal. She exhibits no mass. There is no tenderness.  Musculoskeletal: Normal range of motion.  Lymphadenopathy:    She has no cervical adenopathy.  Neurological: She is alert and oriented to person, place, and time.  Skin: Skin is warm and dry. No rash noted.  Psychiatric: She has a normal mood and affect. Her behavior is normal.          Assessment & Plan:   Viral syndrome with mild asthmatic bronchitis. Will treat symptomatically. Will refill hydrocodone which she has taken in the past for back pain Hypertension stable repeat blood pressure 140/80

## 2012-03-15 NOTE — Patient Instructions (Signed)
Acute bronchitis symptoms for less than 10 days are generally not helped by antibiotics.  Take over-the-counter expectorants and cough medications such as  Mucinex DM.  Call if there is no improvement in 5 to 7 days or if he developed worsening cough, fever, or new symptoms, such as shortness of breath or chest pain.    

## 2012-03-15 NOTE — Addendum Note (Signed)
Addended by: Duard Brady I on: 03/15/2012 04:38 PM   Modules accepted: Orders

## 2012-03-19 ENCOUNTER — Encounter: Payer: Self-pay | Admitting: Internal Medicine

## 2012-03-19 ENCOUNTER — Ambulatory Visit (INDEPENDENT_AMBULATORY_CARE_PROVIDER_SITE_OTHER): Payer: Medicare Other | Admitting: Internal Medicine

## 2012-03-19 VITALS — BP 122/80 | Temp 98.1°F | Wt 181.0 lb

## 2012-03-19 DIAGNOSIS — J40 Bronchitis, not specified as acute or chronic: Secondary | ICD-10-CM

## 2012-03-19 DIAGNOSIS — E119 Type 2 diabetes mellitus without complications: Secondary | ICD-10-CM

## 2012-03-19 MED ORDER — VALACYCLOVIR HCL 500 MG PO TABS: 500.0000 mg | ORAL_TABLET | Freq: Two times a day (BID) | ORAL | Status: DC

## 2012-03-19 MED ORDER — AZITHROMYCIN 250 MG PO TABS: ORAL_TABLET | ORAL | Status: DC

## 2012-03-19 MED ORDER — HYDROCODONE-HOMATROPINE 5-1.5 MG/5ML PO SYRP: 5.0000 mL | ORAL_SOLUTION | Freq: Four times a day (QID) | ORAL | Status: DC | PRN

## 2012-03-19 NOTE — Patient Instructions (Addendum)
Take your antibiotic as prescribed until ALL of it is gone, but stop if you develop a rash, swelling, or any side effects of the medication.  Contact our office as soon as possible if  there are side effects of the medication. 

## 2012-03-19 NOTE — Progress Notes (Signed)
Subjective:    Patient ID: Stephanie Gonzalez, female    DOB: [birth year], 71 y.o.   MRN: [id number]  HPI  71 year old patient who is seen today for followup she was seen earlier and treated symptomatically for a URI. She has had a difficult week with fever reportedly as high as 102 she has developed some fever blisters on her upper left she's had episodes of nausea vomiting worsening chest congestion cough. Symptoms are associated with occasional chills  Past Medical History  Diagnosis Date  . ASTHMA 10/06/2007  . BURSITIS, RIGHT HIP 02/04/2010  . DIABETES MELLITUS, TYPE II 11/01/2009  . HYPERLIPIDEMIA 10/06/2007  . HYPERTENSION 10/06/2007    History   Social History  . Marital Status: Married    Spouse Name: N/A    Number of Children: N/A  . Years of Education: N/A   Occupational History  . Not on file.   Social History Main Topics  . Smoking status: Never Smoker   . Smokeless tobacco: Never Used  . Alcohol Use: Yes     rarely  . Drug Use: No  . Sexually Active: Not on file   Other Topics Concern  . Not on file   Social History Narrative  . No narrative on file    Past Surgical History  Procedure Date  . Appendectomy   . Cholecystectomy   . Abdominal hysterectomy   . Tubal ligation   . Tonsillectomy     No family history on file.  Allergies  Allergen Reactions  . Tetracycline     REACTION: rash, agitation    Current Outpatient Prescriptions on File Prior to Visit  Medication Sig Dispense Refill  . albuterol (PROAIR HFA) 108 (90 BASE) MCG/ACT inhaler Inhale 2 puffs into the lungs every 6 (six) hours as needed.        Marland Kitchen amLODipine (NORVASC) 5 MG tablet Take 1 tablet (5 mg total) by mouth daily.  90 tablet  3  . atorvastatin (LIPITOR) 40 MG tablet Take 1 tablet (40 mg total) by mouth daily.  90 tablet  3  . clonazePAM (KLONOPIN) 1 MG tablet Take 1 tablet (1 mg total) by mouth at bedtime.  90 tablet  1  . conjugated estrogens (PREMARIN) vaginal cream Place 0.5  Applicatorfuls vaginally daily.  42.5 g  1  . diazepam (VALIUM) 5 MG tablet Take 1 tablet (5 mg total) by mouth every 6 (six) hours as needed. From ER visit  30 tablet  1  . gabapentin (NEURONTIN) 100 MG capsule Take 100 mg by mouth 3 (three) times daily.      Marland Kitchen glucose blood (FREESTYLE LITE) test strip 1 each by Other route daily. Use as instructed       . HYDROcodone-acetaminophen (NORCO) 10-325 MG per tablet Take 1 tablet by mouth every 6 (six) hours as needed for pain.  30 tablet  1  . metFORMIN (GLUCOPHAGE) 500 MG tablet Take 500 mg by mouth 2 (two) times daily with a meal.        . sertraline (ZOLOFT) 100 MG tablet Take 0.5 tablets (50 mg total) by mouth daily. 1/2 tablet daily  45 tablet  3    BP 122/80  Temp 98.1 F (36.7 C) (Oral)  Wt 181 lb (82.101 kg)      Review of Systems  Constitutional: Positive for fever, chills, activity change, appetite change and fatigue.  HENT: Positive for congestion. Negative for hearing loss, sore throat, rhinorrhea, dental problem, sinus pressure and tinnitus.  Eyes: Negative for pain, discharge and visual disturbance.  Respiratory: Positive for cough. Negative for shortness of breath.   Cardiovascular: Negative for chest pain, palpitations and leg swelling.  Gastrointestinal: Negative for nausea, vomiting, abdominal pain, diarrhea, constipation, blood in stool and abdominal distention.  Genitourinary: Negative for dysuria, urgency, frequency, hematuria, flank pain, vaginal bleeding, vaginal discharge, difficulty urinating, vaginal pain and pelvic pain.  Musculoskeletal: Negative for joint swelling, arthralgias and gait problem.  Skin: Negative for rash.  Neurological: Positive for weakness. Negative for dizziness, syncope, speech difficulty, numbness and headaches.  Hematological: Negative for adenopathy.  Psychiatric/Behavioral: Negative for behavioral problems, dysphoric mood and agitation. The patient is not nervous/anxious.          Objective:   Physical Exam  Constitutional: She is oriented to person, place, and time. She appears well-developed and well-nourished.  HENT:  Head: Normocephalic.  Right Ear: External ear normal.  Left Ear: External ear normal.       Erythema of the oropharynx  Eyes: Conjunctivae normal and EOM are normal. Pupils are equal, round, and reactive to light.  Neck: Normal range of motion. Neck supple. No thyromegaly present.  Cardiovascular: Normal rate, regular rhythm, normal heart sounds and intact distal pulses.   Pulmonary/Chest: Effort normal and breath sounds normal. No respiratory distress. She has no wheezes. She has no rales.       Chest remains clear O2 saturation 97 Pulse rate 72  Abdominal: Soft. Bowel sounds are normal. She exhibits no mass. There is no tenderness.  Musculoskeletal: Normal range of motion.  Lymphadenopathy:    She has no cervical adenopathy.  Neurological: She is alert and oriented to person, place, and time.  Skin: Skin is warm and dry. No rash noted.       Fever blisters upper lip  Psychiatric: She has a normal mood and affect. Her behavior is normal.          Assessment & Plan:   URI with bronchitis. Will treat with azithromycin Hypertension stable Diabetes mellitus

## 2012-03-29 ENCOUNTER — Other Ambulatory Visit: Payer: Self-pay | Admitting: Internal Medicine

## 2012-03-29 NOTE — Telephone Encounter (Signed)
Last written norco 03-15-12 #30 1RF Hycodan  Written 03-19-12 #144ml  0Rf Please advise

## 2012-03-30 NOTE — Telephone Encounter (Signed)
Called in.

## 2012-03-30 NOTE — Telephone Encounter (Signed)
ok 

## 2012-04-07 ENCOUNTER — Other Ambulatory Visit: Payer: Self-pay | Admitting: Internal Medicine

## 2012-04-08 NOTE — Telephone Encounter (Signed)
Called in.

## 2012-04-08 NOTE — Telephone Encounter (Signed)
ok 

## 2012-04-08 NOTE — Telephone Encounter (Signed)
Last written 03-29-12  Please advise

## 2012-04-16 ENCOUNTER — Ambulatory Visit (INDEPENDENT_AMBULATORY_CARE_PROVIDER_SITE_OTHER): Payer: Medicare Other

## 2012-04-16 DIAGNOSIS — Z23 Encounter for immunization: Secondary | ICD-10-CM

## 2012-04-21 ENCOUNTER — Encounter: Payer: Self-pay | Admitting: Internal Medicine

## 2012-04-21 ENCOUNTER — Ambulatory Visit (INDEPENDENT_AMBULATORY_CARE_PROVIDER_SITE_OTHER): Payer: Medicare Other | Admitting: Internal Medicine

## 2012-04-21 VITALS — BP 156/84 | HR 76 | Temp 97.5°F | Resp 20 | Wt 184.0 lb

## 2012-04-21 DIAGNOSIS — J069 Acute upper respiratory infection, unspecified: Secondary | ICD-10-CM

## 2012-04-21 DIAGNOSIS — J45909 Unspecified asthma, uncomplicated: Secondary | ICD-10-CM

## 2012-04-21 DIAGNOSIS — E119 Type 2 diabetes mellitus without complications: Secondary | ICD-10-CM

## 2012-04-21 MED ORDER — PREDNISONE 20 MG PO TABS: 20.0000 mg | ORAL_TABLET | Freq: Every day | ORAL | Status: DC

## 2012-04-21 MED ORDER — HYDROCODONE-HOMATROPINE 5-1.5 MG/5ML PO SYRP: 5.0000 mL | ORAL_SOLUTION | Freq: Four times a day (QID) | ORAL | Status: DC | PRN

## 2012-04-21 NOTE — Patient Instructions (Signed)
  Prednisone 20 mg twice daily for 5 days Pulmicort 2 inhalations twice daily

## 2012-04-21 NOTE — Progress Notes (Signed)
Subjective:    Patient ID: Stephanie Gonzalez, female    DOB: [birth year], 71 y.o.   MRN: [id number]  HPI     71 year old patient who has a history of asthma and has been ill for about 6 weeks. She has a significant cough that interferes with sleep and work. She has returned to work and after 6 hours develops refractory cough. She describes malaise. He does have type 2 diabetes.  Past Medical History  Diagnosis Date  . ASTHMA 10/06/2007  . BURSITIS, RIGHT HIP 02/04/2010  . DIABETES MELLITUS, TYPE II 11/01/2009  . HYPERLIPIDEMIA 10/06/2007  . HYPERTENSION 10/06/2007    History   Social History  . Marital Status: Married    Spouse Name: N/A    Number of Children: N/A  . Years of Education: N/A   Occupational History  . Not on file.   Social History Main Topics  . Smoking status: Never Smoker   . Smokeless tobacco: Never Used  . Alcohol Use: Yes     Comment: rarely  . Drug Use: No  . Sexually Active: Not on file   Other Topics Concern  . Not on file   Social History Narrative  . No narrative on file    Past Surgical History  Procedure Date  . Appendectomy   . Cholecystectomy   . Abdominal hysterectomy   . Tubal ligation   . Tonsillectomy     No family history on file.  Allergies  Allergen Reactions  . Tetracycline     REACTION: rash, agitation    Current Outpatient Prescriptions on File Prior to Visit  Medication Sig Dispense Refill  . albuterol (PROAIR HFA) 108 (90 BASE) MCG/ACT inhaler Inhale 2 puffs into the lungs every 6 (six) hours as needed.        Marland Kitchen amLODipine (NORVASC) 5 MG tablet Take 1 tablet (5 mg total) by mouth daily.  90 tablet  3  . atorvastatin (LIPITOR) 40 MG tablet Take 1 tablet (40 mg total) by mouth daily.  90 tablet  3  . clonazePAM (KLONOPIN) 1 MG tablet Take 1 tablet (1 mg total) by mouth at bedtime.  90 tablet  1  . gabapentin (NEURONTIN) 100 MG capsule Take 100 mg by mouth 3 (three) times daily.      Marland Kitchen glucose blood (FREESTYLE LITE) test  strip 1 each by Other route daily. Use as instructed       . HYDROcodone-acetaminophen (NORCO) 10-325 MG per tablet TAKE 1 TABLET BY MOUTH EVERY SIX HOURS AS NEEDED FOR PAIN  30 tablet  1  . metFORMIN (GLUCOPHAGE) 500 MG tablet Take 500 mg by mouth 2 (two) times daily with a meal.        . sertraline (ZOLOFT) 100 MG tablet Take 0.5 tablets (50 mg total) by mouth daily. 1/2 tablet daily  45 tablet  3    BP 156/84  Pulse 76  Temp 97.5 F (36.4 C) (Oral)  Resp 20  Wt 184 lb (83.462 kg)       Review of Systems  Constitutional: Positive for fatigue.  HENT: Negative for hearing loss, congestion, sore throat, rhinorrhea, dental problem, sinus pressure and tinnitus.   Eyes: Negative for pain, discharge and visual disturbance.  Respiratory: Positive for cough. Negative for shortness of breath.   Cardiovascular: Negative for chest pain, palpitations and leg swelling.  Gastrointestinal: Negative for nausea, vomiting, abdominal pain, diarrhea, constipation, blood in stool and abdominal distention.  Genitourinary: Negative for dysuria, urgency, frequency, hematuria, flank  pain, vaginal bleeding, vaginal discharge, difficulty urinating, vaginal pain and pelvic pain.  Musculoskeletal: Negative for joint swelling, arthralgias and gait problem.  Skin: Negative for rash.  Neurological: Positive for weakness. Negative for dizziness, syncope, speech difficulty, numbness and headaches.  Hematological: Negative for adenopathy.  Psychiatric/Behavioral: Negative for behavioral problems, dysphoric mood and agitation. The patient is not nervous/anxious.        Objective:   Physical Exam  Constitutional: She is oriented to person, place, and time. She appears well-developed and well-nourished.  HENT:  Head: Normocephalic.  Right Ear: External ear normal.  Left Ear: External ear normal.  Mouth/Throat: Oropharynx is clear and moist.  Eyes: Conjunctivae normal and EOM are normal. Pupils are equal, round,  and reactive to light.  Neck: Normal range of motion. Neck supple. No thyromegaly present.  Cardiovascular: Normal rate, regular rhythm, normal heart sounds and intact distal pulses.   Pulmonary/Chest: Effort normal and breath sounds normal.  Abdominal: Soft. Bowel sounds are normal. She exhibits no mass. There is no tenderness.  Musculoskeletal: Normal range of motion.  Lymphadenopathy:    She has no cervical adenopathy.  Neurological: She is alert and oriented to person, place, and time.  Skin: Skin is warm and dry. No rash noted.  Psychiatric: She has a normal mood and affect. Her behavior is normal.          Assessment & Plan:  Viral URI with cough. Asthma  Will be treated with oral prednisone for 5 days. We'll also place on Pulmicort. She was told to use albuterol every 6 hours. A leave of absence from work for the next 5 days also recommended

## 2012-04-30 ENCOUNTER — Other Ambulatory Visit: Payer: Self-pay | Admitting: *Deleted

## 2012-04-30 MED ORDER — GLUCOSE BLOOD VI STRP: 1.0000 | ORAL_STRIP | Freq: Every day | Status: DC

## 2012-05-08 ENCOUNTER — Other Ambulatory Visit: Payer: Self-pay | Admitting: Internal Medicine

## 2012-05-12 ENCOUNTER — Telehealth: Payer: Self-pay | Admitting: Internal Medicine

## 2012-05-12 NOTE — Telephone Encounter (Signed)
Pt notified Rx refill for Clonazepam was called into pharmacy.

## 2012-05-12 NOTE — Telephone Encounter (Signed)
Pt called to check on status of getting refill of clonazePAM (KLONOPIN) 1 MG tablet. Pls call in to  [id number] - [place], [place] - [address] [phone]

## 2012-06-23 ENCOUNTER — Other Ambulatory Visit: Payer: Self-pay | Admitting: *Deleted

## 2012-06-23 MED ORDER — AMLODIPINE BESYLATE 5 MG PO TABS: 5.0000 mg | ORAL_TABLET | Freq: Every day | ORAL | Status: DC

## 2012-06-23 MED ORDER — SERTRALINE HCL 100 MG PO TABS: 50.0000 mg | ORAL_TABLET | Freq: Every day | ORAL | Status: DC

## 2012-06-23 NOTE — Telephone Encounter (Signed)
Received fax from [facility] for refills on Sertraline and Norvasc. Rx's faxed to [facility] [phone].

## 2012-06-30 ENCOUNTER — Telehealth: Payer: Self-pay | Admitting: Internal Medicine

## 2012-06-30 NOTE — Telephone Encounter (Signed)
Spoke to pt dropped pills in the toilet and needs enough till refill available on 2/4. Pt was try to open bottle at 3:00 AM and dropped pills in toilet. Told pt okay will call in enough till refill available. Pt verbalized understanding.  Called pharmacy and told pharmacist okay to give pt 13 tablets of Klonopin 1 mg tablets till pt can get refill on 2/4.

## 2012-06-30 NOTE — Telephone Encounter (Signed)
Pt called back to follow up on request for meds dropped in toilet.

## 2012-06-30 NOTE — Telephone Encounter (Signed)
Pt clonazepam fell on floor last night. Pt is not due refill until 07-13-2012. Pt would like enough pills to last until 07-13-2012. [facility] [place] [phone]. Pt mother is in hospice. Pt takes med for rls

## 2012-07-14 ENCOUNTER — Other Ambulatory Visit: Payer: Self-pay | Admitting: Internal Medicine

## 2012-07-14 NOTE — Telephone Encounter (Addendum)
Pt needs refill on clonazepam. Pt is out

## 2012-09-06 ENCOUNTER — Other Ambulatory Visit: Payer: Self-pay | Admitting: Internal Medicine

## 2012-09-06 DIAGNOSIS — Z1231 Encounter for screening mammogram for malignant neoplasm of breast: Secondary | ICD-10-CM

## 2012-09-09 ENCOUNTER — Other Ambulatory Visit: Payer: Self-pay | Admitting: *Deleted

## 2012-09-09 MED ORDER — ATORVASTATIN CALCIUM 40 MG PO TABS: 40.0000 mg | ORAL_TABLET | Freq: Every day | ORAL | Status: DC

## 2012-09-09 MED ORDER — METFORMIN HCL 500 MG PO TABS: 500.0000 mg | ORAL_TABLET | Freq: Two times a day (BID) | ORAL | Status: DC

## 2012-09-10 ENCOUNTER — Ambulatory Visit (HOSPITAL_COMMUNITY): Payer: Medicare Other

## 2012-09-15 ENCOUNTER — Telehealth: Payer: Self-pay | Admitting: Internal Medicine

## 2012-09-15 NOTE — Telephone Encounter (Signed)
Patient Information:  Caller Name: Serrina  Phone: [phone]  Patient: Stephanie Gonzalez, Stephanie Gonzalez  Gender: Female  DOB: 01-29-1941  Age: 72 Years  PCP: Eleonore Chiquito [facility])  Office Follow Up:  Does the office need to follow up with this patient?: No  Instructions For The Office: N/A  RN Note:  FBS 120. Denies chest pain. Reports burning raw sensation at base of throat, mild shortness of breath, dry cough, hoarseness, and frequent clearing of throat. Has not started using Albuterol every 4 hours; reported she uses Albuterol once per day when needed for shortness of breath.   Reports bronchial asthma flares up every fall and spring. Hoarseness present with dry cough and chronic wheezing.  Concerned about working due to talks on phone. Agreed to begin using Albuterol every 4 hours.  May use otc antihistamine.  Call back if symptoms persist or worsen.  Symptoms  Reason For Call & Symptoms: Cough with raw/burning chest sensation; unable to take a deep breath.  Reviewed Health History In EMR: Yes  Reviewed Medications In EMR: Yes  Reviewed Allergies In EMR: Yes  Reviewed Surgeries / Procedures: Yes  Date of Onset of Symptoms: 09/14/2012  Treatments Tried: Drinks something with salt in it, Tylenol, water, Ricola cough drops.  Treatments Tried Worked: Yes  Guideline(s) Used:  Asthma Attack  Disposition Per Guideline:   Home Care  Reason For Disposition Reached:   Mild asthma attack (e.g., no SOB at rest, mild SOB with walking, speaks normally in sentences, mild wheezing)  Advice Given:  Quick-Relief Asthma Medicine:   Start your quick-relief medicine (e.g., albuterol, salbutamol) at the first sign of any coughing or shortness of breath (don't wait for wheezing). Use your inhaler (2 puffs each time) or nebulizer every 4 hours. Continue the quick-relief medicine until you have not wheezed or coughed for 48 hours.  The best "cough medicine" for an adult with asthma is always the asthma  medicine (Note: Don't use cough suppressants, but cough drops may help a tickly cough).  Drinking Liquids:  Try to drink normal amount of liquids (e.g., water). Being adequately hydrated makes it easier to cough up the sticky lung mucus.  Humidifier:   If the air is dry, use a cool mist humidifier to prevent drying of the upper airway.  Hay Fever  : If you have nasal symptoms from hay fever, it's OK to take antihistamines (Reasons: poor control of allergic rhinitis makes asthma worse whereas antihistamines don't make asthma worse).  Expected Course:  If treatment is started early, most asthma attacks are quickly brought under control. All wheezing should be gone by 5 days.  Call Back If:  Inhaled asthma medicine (nebulizer or inhaler) is needed more often than every 4 hours  Wheezing has not completely cleared after 5 days  You become worse.  Patient Will Follow Care Advice:  YES

## 2012-09-17 ENCOUNTER — Ambulatory Visit (INDEPENDENT_AMBULATORY_CARE_PROVIDER_SITE_OTHER): Payer: Medicare Other | Admitting: Family Medicine

## 2012-09-17 ENCOUNTER — Encounter: Payer: Self-pay | Admitting: Family Medicine

## 2012-09-17 ENCOUNTER — Ambulatory Visit (HOSPITAL_COMMUNITY): Payer: Medicare Other

## 2012-09-17 VITALS — BP 138/80 | HR 86 | Temp 98.0°F | Wt 187.0 lb

## 2012-09-17 DIAGNOSIS — J069 Acute upper respiratory infection, unspecified: Secondary | ICD-10-CM

## 2012-09-17 DIAGNOSIS — J309 Allergic rhinitis, unspecified: Secondary | ICD-10-CM

## 2012-09-17 NOTE — Progress Notes (Signed)
Chief Complaint  Patient presents with  . Bronchitis    HPI:  Acute visit for sinus issues: -started: 4 days ago - this happens twice a year in the spring and the fall due allergies - has required prednisone in the fall for this, sometimes uses pulmicort when sick, told to use zyrtec and flonase and zyrtec but not using -symptoms:nasal congestion, sore throat, noncough, ear ache, wheezing, mild SOB, clear rhinorrhea, sneezing, hoarseness, no thick mucus production -denies:fever, SOB, NVD, tooth pain -has tried: albuterol -sick contacts: none known -Hx of: has mild intermittent asthma, uses inhaler as needed, occ needs prednisone   ROS: See pertinent positives and negatives per HPI.  Past Medical History  Diagnosis Date  . ASTHMA 10/06/2007  . BURSITIS, RIGHT HIP 02/04/2010  . DIABETES MELLITUS, TYPE II 11/01/2009  . HYPERLIPIDEMIA 10/06/2007  . HYPERTENSION 10/06/2007    No family history on file.  History   Social History  . Marital Status: Married    Spouse Name: N/A    Number of Children: N/A  . Years of Education: N/A   Social History Main Topics  . Smoking status: Never Smoker   . Smokeless tobacco: Never Used  . Alcohol Use: Yes     Comment: rarely  . Drug Use: No  . Sexually Active: None   Other Topics Concern  . None   Social History Narrative  . None    Current outpatient prescriptions:albuterol (PROAIR HFA) 108 (90 BASE) MCG/ACT inhaler, Inhale 2 puffs into the lungs every 6 (six) hours as needed.  , Disp: , Rfl: ;  amLODipine (NORVASC) 5 MG tablet, Take 1 tablet (5 mg total) by mouth daily., Disp: 90 tablet, Rfl: 1;  atorvastatin (LIPITOR) 40 MG tablet, Take 1 tablet (40 mg total) by mouth daily., Disp: 90 tablet, Rfl: 3 clonazePAM (KLONOPIN) 1 MG tablet, TAKE 1 TABLET BY MOUTH AT BEDTIME, Disp: 90 tablet, Rfl: 1;  gabapentin (NEURONTIN) 100 MG capsule, Take 100 mg by mouth 3 (three) times daily., Disp: , Rfl: ;  glucose blood (FREESTYLE LITE) test strip, 1  each by Other route daily., Disp: 100 each, Rfl: 3;  HYDROcodone-acetaminophen (NORCO) 10-325 MG per tablet, TAKE 1 TABLET BY MOUTH EVERY SIX HOURS AS NEEDED FOR PAIN, Disp: 30 tablet, Rfl: 1 HYDROcodone-homatropine (HYCODAN) 5-1.5 MG/5ML syrup, Take 5 mLs by mouth every 6 (six) hours as needed for cough., Disp: 120 mL, Rfl: 0;  metFORMIN (GLUCOPHAGE) 500 MG tablet, Take 1 tablet (500 mg total) by mouth 2 (two) times daily with a meal., Disp: 180 tablet, Rfl: 3;  predniSONE (DELTASONE) 20 MG tablet, Take 1 tablet (20 mg total) by mouth daily., Disp: 10 tablet, Rfl: 0 sertraline (ZOLOFT) 100 MG tablet, Take 0.5 tablets (50 mg total) by mouth daily. 1/2 tablet daily, Disp: 45 tablet, Rfl: 1  EXAM:  Filed Vitals:   09/17/12 1046  BP: 138/80  Pulse: 86  Temp: 98 F (36.7 C)    Body mass index is 32.08 kg/(m^2).  GENERAL: vitals reviewed and listed above, alert, oriented, appears well hydrated and in no acute distress  HEENT: atraumatic, conjunttiva clear, no obvious abnormalities on inspection of external nose and ears, normal appearance of ear canals and TMs, clear nasal congestion, pale boggy turbinates, mild post oropharyngeal erythema with PND, cobblestoning, no tonsillar edema or exudate, no sinus TTP  NECK: no obvious masses on inspection  LUNGS: clear to auscultation bilaterally, no wheezes, rales or rhonchi, good air movement  CV: HRRR, no peripheral edema  MS: moves all extremities without noticeable abnormality  PSYCH: pleasant and cooperative, no obvious depression or anxiety  ASSESSMENT AND PLAN:  Discussed the following assessment and plan:  Allergic rhinitis  Viral upper respiratory infection  -VURI with underlying AR untreated. P has medications at home but did not know how or when she should use them. She did not know how to use her pulmicort inhaler. ADvised starting her allergie meds in the early spring a fall to perhaps avoid these flares. Demonstrated how to use  the pulmicort and feel this will be sufficient for now as normal lung exam and mostly upper airway symptoms. Instructions per below and return precautions. F/u in one month with PCP - may be able to step down on pulmicort if doing well. -Patient advised to return or notify a doctor immediately if symptoms worsen or persist or new concerns arise.  Patient Instructions  -start the zytec daily at night  -use the flonase 2 sprays each nostril daily  -use the pulmicort 2 puffs twice daily   -use albuterol as needed  -warm liquids for your throat and whisper and minimize talking until voice recovers  -folow up with your doctor in one month for your allergies/asthma or sooner if worsening      Bliss Tsang R.

## 2012-09-17 NOTE — Patient Instructions (Signed)
-  start the zytec daily at night  -use the flonase 2 sprays each nostril daily  -use the pulmicort 2 puffs twice daily   -use albuterol as needed  -warm liquids for your throat and whisper and minimize talking until voice recovers  -folow up with your doctor in one month for your allergies/asthma or sooner if worsening

## 2012-09-20 ENCOUNTER — Telehealth: Payer: Self-pay | Admitting: Internal Medicine

## 2012-09-20 ENCOUNTER — Other Ambulatory Visit: Payer: Self-pay | Admitting: Internal Medicine

## 2012-09-20 NOTE — Telephone Encounter (Signed)
Pt called wanting  Hycodan cough syrup sent in to pharmacy.  Pt states she came to see Dr. Selena Batten on 4/11 and was told to take Zyrtec and to use her inhaler.  Pt states she has been doing this but has gotten worse and she knows she has a sinus infection.  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  [facility] [place]  Pls advise.

## 2012-09-20 NOTE — Telephone Encounter (Signed)
Called and spoke with pt and advised of Dr. Elmyra Ricks recommendations.  Pt is aware that rx was sent to pharmacy.

## 2012-09-20 NOTE — Telephone Encounter (Signed)
[facility] Triage Call Report Triage Record Num: [id number] Operator: Roselyn Meier Patient Name: Stephanie Gonzalez Call Date & Time: 09/18/2012 12:29:13PM Patient Phone: [phone] PCP: Gordy Savers Patient Gender: Female PCP Fax : [phone] Patient DOB: [birth year] Practice Name: [facility] Reason for Call: Caller: Renae/Patient; PCP: Eleonore Chiquito [facility]); CB#: [phone]; Call regarding Cough/Congestion; Pt reports she was seen in the office on 09/17/12 by Dr Selena Batten (allergies). Pt states she is coughing and having ear pain. Pt is treating herself with her inhaler. Pt is also taking Zyrtec. Pt reports on 09/17/12 her cough was a dry cough but today (09/18/12) she is coughing up pink frothy sputum. Emergent symptom of 'Coughing up pink frothy sputum' identified in the Cough - Adult Protocol. RN advised for pt to hang up and call 911 to be seen in the ER. Pt is not sure if she is going to follow advice. Protocol(s) Used: Cough - Adult Recommended Outcome per Protocol: Activate EMS 911 Reason for Outcome: Cough producing pink, frothy sputum Care Advice: ~ 04/

## 2012-09-20 NOTE — Telephone Encounter (Signed)
Ok to call in cough medication. If getting worse needs to be seen - did not have a sinus infection when I saw her. Cough and congestion with colds /allergies can last several weeks as we discussed.

## 2012-10-01 ENCOUNTER — Encounter: Payer: Self-pay | Admitting: Internal Medicine

## 2012-10-01 ENCOUNTER — Ambulatory Visit (INDEPENDENT_AMBULATORY_CARE_PROVIDER_SITE_OTHER): Payer: Medicare Other | Admitting: Internal Medicine

## 2012-10-01 VITALS — BP 170/90 | HR 86 | Temp 97.9°F | Resp 20 | Wt 190.0 lb

## 2012-10-01 DIAGNOSIS — I10 Essential (primary) hypertension: Secondary | ICD-10-CM

## 2012-10-01 DIAGNOSIS — E119 Type 2 diabetes mellitus without complications: Secondary | ICD-10-CM

## 2012-10-01 DIAGNOSIS — M25561 Pain in right knee: Secondary | ICD-10-CM

## 2012-10-01 DIAGNOSIS — M25569 Pain in unspecified knee: Secondary | ICD-10-CM

## 2012-10-01 MED ORDER — METHYLPREDNISOLONE ACETATE 80 MG/ML IJ SUSP
120.0000 mg | Freq: Once | INTRAMUSCULAR | Status: AC
  Administered 2012-10-01: 80 mg via INTRAMUSCULAR

## 2012-10-01 MED ORDER — METHYLPREDNISOLONE ACETATE 80 MG/ML IJ SUSP: 120.0000 mg | Freq: Once | INTRAMUSCULAR | Status: DC

## 2012-10-01 MED ORDER — HYDROCODONE-HOMATROPINE 5-1.5 MG/5ML PO SYRP: ORAL_SOLUTION | ORAL | Status: DC

## 2012-10-01 MED ORDER — GLUCOSE BLOOD VI STRP: 1.0000 | ORAL_STRIP | Freq: Every day | Status: DC | PRN

## 2012-10-01 NOTE — Progress Notes (Signed)
Subjective:    Patient ID: Stephanie Gonzalez, female    DOB: April 12, 1941, 72 y.o.   MRN: [id number]  HPI  72 year old patient who has a history of type 2 diabetes hypertension and dyslipidemia. She presents with a several-day history of right knee pain. There's been no trauma. No constitutional complaints  Past Medical History  Diagnosis Date  . ASTHMA 10/06/2007  . BURSITIS, RIGHT HIP 02/04/2010  . DIABETES MELLITUS, TYPE II 11/01/2009  . HYPERLIPIDEMIA 10/06/2007  . HYPERTENSION 10/06/2007    History   Social History  . Marital Status: Married    Spouse Name: N/A    Number of Children: N/A  . Years of Education: N/A   Occupational History  . Not on file.   Social History Main Topics  . Smoking status: Never Smoker   . Smokeless tobacco: Never Used  . Alcohol Use: Yes     Comment: rarely  . Drug Use: No  . Sexually Active: Not on file   Other Topics Concern  . Not on file   Social History Narrative  . No narrative on file    Past Surgical History  Procedure Laterality Date  . Appendectomy    . Cholecystectomy    . Abdominal hysterectomy    . Tubal ligation    . Tonsillectomy      No family history on file.  Allergies  Allergen Reactions  . Tetracycline     REACTION: rash, agitation    Current Outpatient Prescriptions on File Prior to Visit  Medication Sig Dispense Refill  . albuterol (PROAIR HFA) 108 (90 BASE) MCG/ACT inhaler Inhale 2 puffs into the lungs every 6 (six) hours as needed.        Marland Kitchen amLODipine (NORVASC) 5 MG tablet Take 1 tablet (5 mg total) by mouth daily.  90 tablet  1  . atorvastatin (LIPITOR) 40 MG tablet Take 1 tablet (40 mg total) by mouth daily.  90 tablet  3  . clonazePAM (KLONOPIN) 1 MG tablet TAKE 1 TABLET BY MOUTH AT BEDTIME  90 tablet  1  . HYDROcodone-acetaminophen (NORCO) 10-325 MG per tablet TAKE 1 TABLET BY MOUTH EVERY SIX HOURS AS NEEDED FOR PAIN  30 tablet  1  . metFORMIN (GLUCOPHAGE) 500 MG tablet Take 1 tablet (500 mg total) by  mouth 2 (two) times daily with a meal.  180 tablet  3  . sertraline (ZOLOFT) 100 MG tablet Take 0.5 tablets (50 mg total) by mouth daily. 1/2 tablet daily  45 tablet  1   No current facility-administered medications on file prior to visit.    BP 170/90  Pulse 86  Temp(Src) 97.9 F (36.6 C) (Oral)  Resp 20  Wt 190 lb (86.183 kg)  BMI 32.6 kg/m2  SpO2 96%       Review of Systems  Constitutional: Negative.   HENT: Negative for hearing loss, congestion, sore throat, rhinorrhea, dental problem, sinus pressure and tinnitus.   Eyes: Negative for pain, discharge and visual disturbance.  Respiratory: Negative for cough and shortness of breath.   Cardiovascular: Negative for chest pain, palpitations and leg swelling.  Gastrointestinal: Negative for nausea, vomiting, abdominal pain, diarrhea, constipation, blood in stool and abdominal distention.  Genitourinary: Negative for dysuria, urgency, frequency, hematuria, flank pain, vaginal bleeding, vaginal discharge, difficulty urinating, vaginal pain and pelvic pain.  Musculoskeletal: Positive for arthralgias and gait problem. Negative for joint swelling.  Skin: Negative for rash.  Neurological: Negative for dizziness, syncope, speech difficulty, weakness, numbness and headaches.  Hematological: Negative for adenopathy.  Psychiatric/Behavioral: Negative for behavioral problems, dysphoric mood and agitation. The patient is not nervous/anxious.        Objective:   Physical Exam  Constitutional: She appears well-developed and well-nourished. No distress.  Blood pressure 140/80 on repeat  Musculoskeletal:  Right knee exam and no signs of active inflammation Slight tenderness along the medial joint line No obvious effusion  Walks with a limp          Assessment & Plan:   Right knee pain. Will treat with Depo-Medrol continued anti-inflammatory medications and apply warm compresses to the weekend. She will call early next week if  unimproved. Hypertension stable  diabetes mellitus stable

## 2012-10-01 NOTE — Patient Instructions (Addendum)
You  may move around, but avoid painful motions and activities.  Apply  heat to the sore area for 15 to 20 minutes 3 or 4 times daily for the next two to 3 days.   Take Aleve 200 mg twice daily for pain or swelling

## 2012-10-27 ENCOUNTER — Ambulatory Visit (HOSPITAL_COMMUNITY)
Admission: RE | Admit: 2012-10-27 | Discharge: 2012-10-27 | Disposition: A | Discharge status: Home / Self Care | Payer: Medicare Other | Source: Ambulatory Visit | Attending: Internal Medicine | Admitting: Internal Medicine

## 2012-10-27 DIAGNOSIS — Z1231 Encounter for screening mammogram for malignant neoplasm of breast: Secondary | ICD-10-CM | POA: Insufficient documentation

## 2013-01-02 ENCOUNTER — Other Ambulatory Visit: Payer: Self-pay | Admitting: Internal Medicine

## 2013-01-05 ENCOUNTER — Other Ambulatory Visit: Payer: Self-pay | Admitting: Internal Medicine

## 2013-01-06 ENCOUNTER — Telehealth: Payer: Self-pay | Admitting: Internal Medicine

## 2013-01-06 MED ORDER — HYDROCODONE-HOMATROPINE 5-1.5 MG/5ML PO SYRP: ORAL_SOLUTION | ORAL | Status: DC

## 2013-01-06 NOTE — Telephone Encounter (Signed)
Rx called in.  Left message on machine for patient.   

## 2013-01-06 NOTE — Telephone Encounter (Signed)
6 oz no RF

## 2013-01-06 NOTE — Telephone Encounter (Signed)
Patient Information:  Caller Name: Stephanie Gonzalez  Phone: [phone]  Patient: Stephanie, Gonzalez  Gender: Female  DOB: 11/29/1940  Age: 72 Years  PCP: Eleonore Chiquito [facility])  Office Follow Up:  Does the office need to follow up with this patient?: Yes  Instructions For The Office: PLS SEE RN NOTE  RN Note:  Pt states she has a history of URI w/ Asthma and was advised by Dr Amador Cunas to call before symptoms got bad for Cough syrup to go along w/ ProAir inhaler.  Pt has slight dry Cough w/ hoarse voice, same initial symptoms she gets w/ URI.  Appt offered.  Pt states Dr Kirtland Bouchard always calls in refills of Hycodan.  Last OV was 10-01-12, [facility] [place]  PLEASE REVIEW HYCODAN REFILL REQUEST W/ MD AND F/U W/ PT, VMAIL MAY BE LEFT PER PT.  Symptoms  Reason For Call & Symptoms: Dry hoarse voice w/ slight Cough  Reviewed Health History In EMR: Yes  Reviewed Medications In EMR: Yes  Reviewed Allergies In EMR: Yes  Reviewed Surgeries / Procedures: Yes  Date of Onset of Symptoms: 01/05/2013  Treatments Tried: ProAir  Treatments Tried Worked: No  Guideline(s) Used:  Asthma Attack  Disposition Per Guideline:   See Today or Tomorrow in Office  Reason For Disposition Reached:   Mild asthma attack (e.g., no SOB at rest, mild SOB with walking, speaks normally in sentences, mild wheezing) and persists > 24 hours on appropriate treatment  Advice Given:  N/A  Patient Refused Recommendation:  Patient Requests Prescription  PLS SEE RN NOTES

## 2013-01-06 NOTE — Telephone Encounter (Signed)
Pt stated that she just wants a refill on her Hydrocodone-homatropine. Pt stated that per Dr. Kirtland Bouchard if she feels that her cough is getting worse to call for a refill.  [facility]

## 2013-01-26 ENCOUNTER — Other Ambulatory Visit: Payer: Self-pay | Admitting: Internal Medicine

## 2013-01-27 NOTE — Telephone Encounter (Signed)
ok 

## 2013-02-14 ENCOUNTER — Other Ambulatory Visit: Payer: Self-pay | Admitting: Internal Medicine

## 2013-04-04 ENCOUNTER — Ambulatory Visit (INDEPENDENT_AMBULATORY_CARE_PROVIDER_SITE_OTHER): Payer: Medicare Other

## 2013-04-04 DIAGNOSIS — Z23 Encounter for immunization: Secondary | ICD-10-CM

## 2013-04-20 ENCOUNTER — Ambulatory Visit (INDEPENDENT_AMBULATORY_CARE_PROVIDER_SITE_OTHER): Payer: Medicare Other | Admitting: Internal Medicine

## 2013-04-20 ENCOUNTER — Encounter: Payer: Self-pay | Admitting: Internal Medicine

## 2013-04-20 VITALS — BP 120/80 | HR 60 | Temp 97.4°F | Resp 20 | Ht 63.5 in | Wt 186.0 lb

## 2013-04-20 DIAGNOSIS — E785 Hyperlipidemia, unspecified: Secondary | ICD-10-CM

## 2013-04-20 DIAGNOSIS — Z0000 Encounter for general adult medical examination without abnormal findings: Secondary | ICD-10-CM

## 2013-04-20 DIAGNOSIS — E119 Type 2 diabetes mellitus without complications: Secondary | ICD-10-CM

## 2013-04-20 DIAGNOSIS — J45909 Unspecified asthma, uncomplicated: Secondary | ICD-10-CM

## 2013-04-20 DIAGNOSIS — I10 Essential (primary) hypertension: Secondary | ICD-10-CM

## 2013-04-20 LAB — CBC WITH DIFFERENTIAL/PLATELET
Basophils Absolute: 0 10*3/uL (ref 0.0–0.1)
Eosinophils Absolute: 0.1 10*3/uL (ref 0.0–0.7)
HCT: 39 % (ref 36.0–46.0)
Hemoglobin: 13 g/dL (ref 12.0–15.0)
Lymphs Abs: 2.9 10*3/uL (ref 0.7–4.0)
MCHC: 33.3 g/dL (ref 30.0–36.0)
Monocytes Absolute: 0.6 10*3/uL (ref 0.1–1.0)
Monocytes Relative: 7.1 % (ref 3.0–12.0)
Neutro Abs: 4.6 10*3/uL (ref 1.4–7.7)
Platelets: 209 10*3/uL (ref 150.0–400.0)
RDW: 13.6 % (ref 11.5–14.6)

## 2013-04-20 LAB — LIPID PANEL
Total CHOL/HDL Ratio: 4
Triglycerides: 205 mg/dL — ABNORMAL HIGH (ref 0.0–149.0)
VLDL: 41 mg/dL — ABNORMAL HIGH (ref 0.0–40.0)

## 2013-04-20 LAB — COMPREHENSIVE METABOLIC PANEL
ALT: 9 U/L (ref 0–35)
AST: 12 U/L (ref 0–37)
CO2: 31 mEq/L (ref 19–32)
Chloride: 101 mEq/L (ref 96–112)
Creatinine, Ser: 0.7 mg/dL (ref 0.4–1.2)
GFR: 90.64 mL/min (ref >60.00)
Sodium: 139 mEq/L (ref 135–145)
Total Bilirubin: 0.8 mg/dL (ref 0.3–1.2)
Total Protein: 7.3 g/dL (ref 6.0–8.3)

## 2013-04-20 LAB — LDL CHOLESTEROL, DIRECT: Direct LDL: 129.5 mg/dL

## 2013-04-20 MED ORDER — HYDROCODONE-HOMATROPINE 5-1.5 MG/5ML PO SYRP: ORAL_SOLUTION | ORAL | Status: DC

## 2013-04-20 NOTE — Progress Notes (Signed)
Subjective:    Patient ID: Stephanie Gonzalez, female    DOB: 1940/11/16, 72 y.o.   MRN: [id number]  HPI  Pre-visit discussion using our clinic review tool. No additional management support is needed unless otherwise documented below in the visit note.  72 year-old patient who is seen today for a preventive health examination. She has a history of type 2 diabetes controlled on metformin therapy. She has hypertension and dyslipidemia. She has done quite well. She has recently had right knee arthroscopic surgery. She has chronic low back pain. She has a history of shingles nephrolithiasis. No new concerns or complaints today except for the onset of a mild URI yesterday.  Past Medical History  Diagnosis Date  . ASTHMA 10/06/2007  . BURSITIS, RIGHT HIP 02/04/2010  . DIABETES MELLITUS, TYPE II 11/01/2009  . HYPERLIPIDEMIA 10/06/2007  . HYPERTENSION 10/06/2007    History   Social History  . Marital Status: Married    Spouse Name: N/A    Number of Children: N/A  . Years of Education: N/A   Occupational History  . Not on file.   Social History Main Topics  . Smoking status: Never Smoker   . Smokeless tobacco: Never Used  . Alcohol Use: Yes     Comment: rarely  . Drug Use: No  . Sexual Activity: Not on file   Other Topics Concern  . Not on file   Social History Narrative  . No narrative on file    Past Surgical History  Procedure Laterality Date  . Appendectomy    . Cholecystectomy    . Abdominal hysterectomy    . Tubal ligation    . Tonsillectomy      No family history on file.  Allergies  Allergen Reactions  . Tetracycline     REACTION: rash, agitation    Current Outpatient Prescriptions on File Prior to Visit  Medication Sig Dispense Refill  . albuterol (PROAIR HFA) 108 (90 BASE) MCG/ACT inhaler Inhale 2 puffs into the lungs every 6 (six) hours as needed.        Marland Kitchen amLODipine (NORVASC) 5 MG tablet Take 1 tablet (5 mg total) by mouth daily.  90 tablet  1  .  atorvastatin (LIPITOR) 40 MG tablet Take 1 tablet (40 mg total) by mouth daily.  90 tablet  3  . clonazePAM (KLONOPIN) 1 MG tablet TAKE 1 TABLET BY MOUTH AT BEDTIME  90 tablet  1  . glucose blood (FREESTYLE LITE) test strip 1 each by Other route daily as needed for other.  100 each  3  . HYDROcodone-acetaminophen (NORCO) 10-325 MG per tablet TAKE 1 TABLET BY MOUTH EVERY SIX HOURS AS NEEDED FOR PAIN  30 tablet  1  . sertraline (ZOLOFT) 100 MG tablet Take one-half tablet by  mouth daily  45 tablet  2   No current facility-administered medications on file prior to visit.    BP 120/80  Pulse 60  Temp(Src) 97.4 F (36.3 C) (Oral)  Resp 20  Ht 5' 3.5" (1.613 m)  Wt 186 lb (84.369 kg)  BMI 32.43 kg/m2  SpO2 97%   1. Risk factors, based on past  M,S,F history-  cardiovascular risk factors include type 2 diabetes hypertension and dyslipidemia.  2.  Physical activities: Remains fairly active remains full time employed ; works with [employer] as a Scientist, physiological  3.  Depression/mood: History of anxiety depression which has been stable  4.  Hearing: No deficits  5.  ADL's: Independent in all  aspects of daily living  6.  Fall risk: Low  7.  Home safety: No problems identified  8.  Height weight, and visual acuity; height and weight stable no change in visual acuity has been told recently that she has early cataracts and mild macular degeneration  9.  Counseling: Weight loss exercise encouraged  10. Lab orders based on risk factors: Laboratory profile including hemoglobin A1c and lipid panel will be reviewed  11. Referral : Not appropriate at this time  12. Care plan: weight loss exercise recommended heart healthy diet recommended  13. Cognitive assessment: Alert and oriented with normal affect. No cognitive dysfunction.   Colonoscopy 2011    Review of Systems  Constitutional: Negative.   HENT: Negative for congestion, dental problem, hearing loss, rhinorrhea, sinus pressure, sore  throat and tinnitus.   Eyes: Negative for pain, discharge and visual disturbance.  Respiratory: Negative for cough and shortness of breath.   Cardiovascular: Negative for chest pain, palpitations and leg swelling.  Gastrointestinal: Negative for nausea, vomiting, abdominal pain, diarrhea, constipation, blood in stool and abdominal distention.  Genitourinary: Negative for dysuria, urgency, frequency, hematuria, flank pain, vaginal bleeding, vaginal discharge, difficulty urinating, vaginal pain and pelvic pain.  Musculoskeletal: Positive for back pain. Negative for arthralgias, gait problem and joint swelling.  Skin: Negative for rash.  Neurological: Negative for dizziness, syncope, speech difficulty, weakness, numbness and headaches.  Hematological: Negative for adenopathy.  Psychiatric/Behavioral: Negative for behavioral problems, dysphoric mood and agitation. The patient is not nervous/anxious.        Objective:   Physical Exam  Constitutional: She is oriented to person, place, and time. She appears well-developed and well-nourished.  HENT:  Head: Normocephalic and atraumatic.  Right Ear: External ear normal.  Left Ear: External ear normal.  Mouth/Throat: Oropharynx is clear and moist.  Eyes: Conjunctivae and EOM are normal.  Neck: Normal range of motion. Neck supple. No JVD present. No thyromegaly present.  Cardiovascular: Normal rate, regular rhythm, normal heart sounds and intact distal pulses.   No murmur heard. Pulmonary/Chest: Effort normal and breath sounds normal. She has no wheezes. She has no rales.  Abdominal: Soft. Bowel sounds are normal. She exhibits no distension and no mass. There is no tenderness. There is no rebound and no guarding.  Musculoskeletal: Normal range of motion. She exhibits no edema and no tenderness.  Neurological: She is alert and oriented to person, place, and time. She has normal reflexes. No cranial nerve deficit. She exhibits normal muscle tone.  Coordination normal.  Skin: Skin is warm and dry. No rash noted.  Psychiatric: She has a normal mood and affect. Her behavior is normal.          Assessment & Plan:   Preventive health exam Diabetes mellitus. We'll check a hemoglobin A1c and urine for microalbumin Dyslipidemia. We'll check a lipid profile Hypertension well controlled

## 2013-04-20 NOTE — Patient Instructions (Signed)
Limit your sodium (Salt) intake   Please check your hemoglobin A1c every 3 months    It is important that you exercise regularly, at least 20 minutes 3 to 4 times per week.  If you develop chest pain or shortness of breath seek  medical attention.  You need to lose weight.  Consider a lower calorie diet and regular exercise. 

## 2013-04-21 LAB — MICROALBUMIN / CREATININE URINE RATIO
Creatinine,U: 103.6 mg/dL
Microalb Creat Ratio: 2.3 mg/g (ref 0.0–30.0)
Microalb, Ur: 2.4 mg/dL — ABNORMAL HIGH (ref 0.0–1.9)

## 2013-04-21 LAB — HEMOGLOBIN A1C: Hgb A1c MFr Bld: 5.3 % (ref 4.6–6.5)

## 2013-06-01 ENCOUNTER — Telehealth: Payer: Self-pay | Admitting: Internal Medicine

## 2013-06-01 MED ORDER — HYDROCODONE-HOMATROPINE 5-1.5 MG/5ML PO SYRP: ORAL_SOLUTION | ORAL | Status: DC

## 2013-06-01 NOTE — Telephone Encounter (Signed)
ok 

## 2013-06-01 NOTE — Telephone Encounter (Signed)
Rx ready for pick up. 

## 2013-06-01 NOTE — Telephone Encounter (Signed)
Pt aware.

## 2013-06-01 NOTE — Telephone Encounter (Signed)
Pt has cough and chest congestion and would like a refill of HYDROcodone-homatropine (HYCODAN) 5-1.5 MG/5ML syrup Pt states she gets bronchial asthma every year. pls advise Pt lives in [place] and will need to leave asap if he will write rx.

## 2013-07-05 ENCOUNTER — Ambulatory Visit (INDEPENDENT_AMBULATORY_CARE_PROVIDER_SITE_OTHER): Payer: Medicare Other | Admitting: Internal Medicine

## 2013-07-05 ENCOUNTER — Encounter: Payer: Self-pay | Admitting: Internal Medicine

## 2013-07-05 VITALS — BP 160/82 | HR 81 | Temp 98.5°F | Resp 20 | Ht 63.5 in | Wt 189.0 lb

## 2013-07-05 DIAGNOSIS — I10 Essential (primary) hypertension: Secondary | ICD-10-CM

## 2013-07-05 DIAGNOSIS — J45909 Unspecified asthma, uncomplicated: Secondary | ICD-10-CM

## 2013-07-05 DIAGNOSIS — J111 Influenza due to unidentified influenza virus with other respiratory manifestations: Secondary | ICD-10-CM

## 2013-07-05 MED ORDER — HYDROCODONE-HOMATROPINE 5-1.5 MG/5ML PO SYRP: ORAL_SOLUTION | ORAL | Status: DC

## 2013-07-05 MED ORDER — OSELTAMIVIR PHOSPHATE 75 MG PO CAPS: 75.0000 mg | ORAL_CAPSULE | Freq: Two times a day (BID) | ORAL | Status: DC

## 2013-07-05 NOTE — Progress Notes (Signed)
Subjective:    Patient ID: Stephanie Gonzalez, female    DOB: 01-Mar-1941, 73 y.o.   MRN: [id number]  HPI  73 year old patient who presents today with the abrupt onset of fever severe myalgias weakness and nonproductive cough. Her chief complaint is severe generalized body aches. She describes some mild ear discomfort.  Past Medical History  Diagnosis Date  . ASTHMA 10/06/2007  . BURSITIS, RIGHT HIP 02/04/2010  . DIABETES MELLITUS, TYPE II 11/01/2009  . HYPERLIPIDEMIA 10/06/2007  . HYPERTENSION 10/06/2007    History   Social History  . Marital Status: Married    Spouse Name: N/A    Number of Children: N/A  . Years of Education: N/A   Occupational History  . Not on file.   Social History Main Topics  . Smoking status: Never Smoker   . Smokeless tobacco: Never Used  . Alcohol Use: Yes     Comment: rarely  . Drug Use: No  . Sexual Activity: Not on file   Other Topics Concern  . Not on file   Social History Narrative  . No narrative on file    Past Surgical History  Procedure Laterality Date  . Appendectomy    . Cholecystectomy    . Abdominal hysterectomy    . Tubal ligation    . Tonsillectomy      No family history on file.  Allergies  Allergen Reactions  . Tetracycline     REACTION: rash, agitation    Current Outpatient Prescriptions on File Prior to Visit  Medication Sig Dispense Refill  . albuterol (PROAIR HFA) 108 (90 BASE) MCG/ACT inhaler Inhale 2 puffs into the lungs every 6 (six) hours as needed.        Marland Kitchen amLODipine (NORVASC) 5 MG tablet Take 1 tablet (5 mg total) by mouth daily.  90 tablet  1  . atorvastatin (LIPITOR) 40 MG tablet Take 1 tablet (40 mg total) by mouth daily.  90 tablet  3  . clonazePAM (KLONOPIN) 1 MG tablet TAKE 1 TABLET BY MOUTH AT BEDTIME  90 tablet  1  . glucose blood (FREESTYLE LITE) test strip 1 each by Other route daily as needed for other.  100 each  3  . HYDROcodone-acetaminophen (NORCO) 10-325 MG per tablet TAKE 1 TABLET BY MOUTH  EVERY SIX HOURS AS NEEDED FOR PAIN  30 tablet  1  . HYDROcodone-homatropine (HYCODAN) 5-1.5 MG/5ML syrup TAKE 1 TEASPOONFUL EVERY 6 HOURS AS NEEDED  180 mL  0  . metFORMIN (GLUCOPHAGE) 500 MG tablet Take 500 mg by mouth daily with breakfast.      . nabumetone (RELAFEN) 500 MG tablet Take 500 mg by mouth 2 (two) times daily.      . sertraline (ZOLOFT) 100 MG tablet Take one-half tablet by  mouth daily  45 tablet  2   No current facility-administered medications on file prior to visit.    BP 160/82  Pulse 81  Temp(Src) 98.5 F (36.9 C) (Oral)  Resp 20  Ht 5' 3.5" (1.613 m)  Wt 189 lb (85.73 kg)  BMI 32.95 kg/m2  SpO2 97%       Review of Systems  Constitutional: Positive for fever, chills, activity change, appetite change and fatigue. Negative for diaphoresis.  HENT: Positive for sore throat. Negative for congestion, dental problem, hearing loss, rhinorrhea, sinus pressure and tinnitus.   Eyes: Negative for pain, discharge and visual disturbance.  Respiratory: Positive for cough. Negative for shortness of breath.   Cardiovascular: Negative for chest  pain, palpitations and leg swelling.  Gastrointestinal: Negative for nausea, vomiting, abdominal pain, diarrhea, constipation, blood in stool and abdominal distention.  Genitourinary: Negative for dysuria, urgency, frequency, hematuria, flank pain, vaginal bleeding, vaginal discharge, difficulty urinating, vaginal pain and pelvic pain.  Musculoskeletal: Negative for arthralgias, gait problem and joint swelling.  Skin: Negative for rash.  Neurological: Negative for dizziness, syncope, speech difficulty, weakness, numbness and headaches.  Hematological: Negative for adenopathy.  Psychiatric/Behavioral: Negative for behavioral problems, dysphoric mood and agitation. The patient is not nervous/anxious.        Objective:   Physical Exam  Constitutional: She is oriented to person, place, and time. She appears well-developed and  well-nourished.  HENT:  Head: Normocephalic.  Right Ear: External ear normal.  Left Ear: External ear normal.  Mild erythema  Eyes: Conjunctivae and EOM are normal. Pupils are equal, round, and reactive to light.  Neck: Normal range of motion. Neck supple. No thyromegaly present.  Cardiovascular: Normal rate, regular rhythm, normal heart sounds and intact distal pulses.   Pulmonary/Chest: Effort normal and breath sounds normal.  Abdominal: Soft. Bowel sounds are normal. She exhibits no mass. There is no tenderness.  Musculoskeletal: Normal range of motion.  Lymphadenopathy:    She has no cervical adenopathy.  Neurological: She is alert and oriented to person, place, and time.  Skin: Skin is warm and dry. No rash noted.  Psychiatric: She has a normal mood and affect. Her behavior is normal.          Assessment & Plan:   Flu syndrome. We'll treat with Tamiflu for 5 days. We'll treat symptomatically Hypertension stable Diabetes. And the A1c nondiabetic range we'll reassess in 3 months

## 2013-07-05 NOTE — Patient Instructions (Signed)

## 2013-07-05 NOTE — Progress Notes (Signed)
Pre-visit discussion using our clinic review tool. No additional management support is needed unless otherwise documented below in the visit note.  

## 2013-07-09 ENCOUNTER — Other Ambulatory Visit: Payer: Self-pay | Admitting: Internal Medicine

## 2013-07-13 ENCOUNTER — Telehealth: Payer: Self-pay | Admitting: Internal Medicine

## 2013-07-13 NOTE — Telephone Encounter (Signed)
Relevant patient education mailed to patient.  

## 2013-09-13 ENCOUNTER — Telehealth: Payer: Self-pay | Admitting: Internal Medicine

## 2013-09-13 MED ORDER — AMLODIPINE BESYLATE 5 MG PO TABS: 5.0000 mg | ORAL_TABLET | Freq: Every day | ORAL | Status: DC

## 2013-09-13 NOTE — Telephone Encounter (Signed)
Error/njr °

## 2013-09-13 NOTE — Telephone Encounter (Signed)
[facility] - [place], [place] - [address]  Is requesting re-fill on amLODipine (NORVASC) 5 MG tablet

## 2013-09-13 NOTE — Telephone Encounter (Signed)
Rx sent 

## 2013-09-13 NOTE — Telephone Encounter (Signed)
Rx sent to [facility] 

## 2013-09-13 NOTE — Addendum Note (Signed)
Addended by: Jimmye NormanPHANOS, Emin Foree J on: 09/13/2013 01:54 PM   Modules accepted: Orders

## 2013-09-13 NOTE — Telephone Encounter (Signed)
Pt correct and [facility]

## 2013-09-20 ENCOUNTER — Telehealth: Payer: Self-pay | Admitting: Internal Medicine

## 2013-09-20 MED ORDER — HYDROCODONE-HOMATROPINE 5-1.5 MG/5ML PO SYRP: ORAL_SOLUTION | ORAL | Status: DC

## 2013-09-20 NOTE — Telephone Encounter (Signed)
Pt would like a rx for HYDROcodone-homatropine (HYCODAN) 5-1.5 MG/5ML syrup Pt has allergies and states DR K gives this to her seasonally. Pt request to pick up this afternoon, asap. pls advise

## 2013-09-20 NOTE — Telephone Encounter (Signed)
Pt notified Rx ready for pickup. Rx printed and signed.  

## 2013-11-18 ENCOUNTER — Other Ambulatory Visit: Payer: Self-pay | Admitting: Internal Medicine

## 2013-12-29 ENCOUNTER — Telehealth: Payer: Self-pay | Admitting: Internal Medicine

## 2013-12-29 ENCOUNTER — Ambulatory Visit (INDEPENDENT_AMBULATORY_CARE_PROVIDER_SITE_OTHER): Payer: Medicare Other | Admitting: Internal Medicine

## 2013-12-29 ENCOUNTER — Encounter: Payer: Self-pay | Admitting: Internal Medicine

## 2013-12-29 VITALS — BP 134/74 | HR 61 | Temp 98.0°F | Ht 63.5 in | Wt 176.0 lb

## 2013-12-29 DIAGNOSIS — E119 Type 2 diabetes mellitus without complications: Secondary | ICD-10-CM

## 2013-12-29 DIAGNOSIS — J069 Acute upper respiratory infection, unspecified: Secondary | ICD-10-CM

## 2013-12-29 DIAGNOSIS — B9789 Other viral agents as the cause of diseases classified elsewhere: Secondary | ICD-10-CM

## 2013-12-29 DIAGNOSIS — I10 Essential (primary) hypertension: Secondary | ICD-10-CM

## 2013-12-29 DIAGNOSIS — J45909 Unspecified asthma, uncomplicated: Secondary | ICD-10-CM

## 2013-12-29 MED ORDER — HYDROCODONE-HOMATROPINE 5-1.5 MG/5ML PO SYRP: ORAL_SOLUTION | ORAL | Status: DC

## 2013-12-29 NOTE — Telephone Encounter (Signed)
Patient Information:  Caller Name: Fannie KneeSue  Phone: [phone]  Patient: Stephanie Gonzalez, Stephanie Gonzalez  Gender: Female  DOB: Jan 28, 1941  Age: 73 Years  PCP: Eleonore ChiquitoKwiatkowski, [facility](Family Practice)  Office Follow Up:  Does the office need to follow up with this patient?: No  Instructions For The Office: N/A   Symptoms  Reason For Call & Symptoms: "Rawness" in chest as a result of a cough that eventually causes vomiting. Patient has a scheduled appointment for evaluation in office at 2:30pm, today, 12/29/13.  Reviewed Health History In EMR: Yes  Reviewed Medications In EMR: Yes  Reviewed Allergies In EMR: Yes  Reviewed Surgeries / Procedures: Yes  Date of Onset of Symptoms: 12/28/2013  Treatments Tried: Ricola, Humidifier  Treatments Tried Worked: No  Guideline(s) Used:  Chest Pain  Disposition Per Guideline:   See Today in Office  Reason For Disposition Reached:   All other patients with chest pain  Advice Given:  Call Back If:  Severe chest pain  Constant chest pain lasting longer than 5 minutes  Difficulty breathing  Fever  You become worse.  Patient Will Follow Care Advice:  YES

## 2013-12-29 NOTE — Telephone Encounter (Signed)
noted 

## 2013-12-29 NOTE — Patient Instructions (Signed)
Acute bronchitis symptoms for less than 10 days are generally not helped by antibiotics.  Take over-the-counter expectorants and cough medications such as  Mucinex DM.  Call if there is no improvement in 5 to 7 days or if  you develop worsening cough, fever, or new symptoms, such as shortness of breath or chest pain.  Limit your sodium (Salt) intake

## 2013-12-29 NOTE — Progress Notes (Signed)
Subjective:    Patient ID: Stephanie RamusSue M Gavidia, female    DOB: 05-Aug-1940, 73 y.o.   MRN: [id number]  HPI  73 year old patient medical problems include hypertension, diabetes, and asthma.  She presents with a one day history of hoarseness and refractory cough.  This has interfered with her job performance.  No fever or sputum production.  No active wheezing.  She does have a home nebulizer treatment and albuterol MDI. She is followed for chronic low back pain and is considering surgery.  She is scheduled for epidurals, which have been helpful in the past  Past Medical History  Diagnosis Date  . ASTHMA 10/06/2007  . BURSITIS, RIGHT HIP 02/04/2010  . DIABETES MELLITUS, TYPE II 11/01/2009  . HYPERLIPIDEMIA 10/06/2007  . HYPERTENSION 10/06/2007    History   Social History  . Marital Status: Married    Spouse Name: N/A    Number of Children: N/A  . Years of Education: N/A   Occupational History  . Not on file.   Social History Main Topics  . Smoking status: Never Smoker   . Smokeless tobacco: Never Used  . Alcohol Use: Yes     Comment: rarely  . Drug Use: No  . Sexual Activity: Not on file   Other Topics Concern  . Not on file   Social History Narrative  . No narrative on file    Past Surgical History  Procedure Laterality Date  . Appendectomy    . Cholecystectomy    . Abdominal hysterectomy    . Tubal ligation    . Tonsillectomy      No family history on file.  Allergies  Allergen Reactions  . Tetracycline     REACTION: rash, agitation    Current Outpatient Prescriptions on File Prior to Visit  Medication Sig Dispense Refill  . albuterol (PROAIR HFA) 108 (90 BASE) MCG/ACT inhaler Inhale 2 puffs into the lungs every 6 (six) hours as needed.        Marland Kitchen. amLODipine (NORVASC) 5 MG tablet Take 1 tablet (5 mg total) by mouth daily.  90 tablet  1  . atorvastatin (LIPITOR) 40 MG tablet Take 1 tablet (40 mg total) by mouth daily.  90 tablet  1  . clonazePAM (KLONOPIN) 1 MG  tablet TAKE 1 TABLET BY MOUTH AT BEDTIME  90 tablet  1  . glucose blood (FREESTYLE LITE) test strip 1 each by Other route daily as needed for other.  100 each  3  . HYDROcodone-acetaminophen (NORCO) 10-325 MG per tablet TAKE 1 TABLET BY MOUTH EVERY SIX HOURS AS NEEDED FOR PAIN  30 tablet  1  . metFORMIN (GLUCOPHAGE) 500 MG tablet Take 500 mg by mouth daily with breakfast.      . metFORMIN (GLUCOPHAGE) 500 MG tablet Take 1 tablet by mouth  twice a day with a meal.  180 tablet  1  . nabumetone (RELAFEN) 500 MG tablet Take 500 mg by mouth 2 (two) times daily.      Marland Kitchen. oseltamivir (TAMIFLU) 75 MG capsule Take 1 capsule (75 mg total) by mouth 2 (two) times daily.  10 capsule  0  . sertraline (ZOLOFT) 100 MG tablet Take one-half tablet by  mouth daily  45 tablet  2  . HYDROcodone-homatropine (HYCODAN) 5-1.5 MG/5ML syrup TAKE 1 TEASPOONFUL EVERY 6 HOURS AS NEEDED  180 mL  0   No current facility-administered medications on file prior to visit.    BP 134/74  Pulse 61  Temp(Src) 98 F (  36.7 C) (Oral)  Ht 5' 3.5" (1.613 m)  Wt 176 lb (79.833 kg)  BMI 30.68 kg/m2  SpO2 98%       Review of Systems  Constitutional: Negative.   HENT: Positive for voice change. Negative for congestion, dental problem, hearing loss, rhinorrhea, sinus pressure, sore throat and tinnitus.   Eyes: Negative for pain, discharge and visual disturbance.  Respiratory: Positive for cough. Negative for shortness of breath.   Cardiovascular: Negative for chest pain, palpitations and leg swelling.  Gastrointestinal: Negative for nausea, vomiting, abdominal pain, diarrhea, constipation, blood in stool and abdominal distention.  Genitourinary: Negative for dysuria, urgency, frequency, hematuria, flank pain, vaginal bleeding, vaginal discharge, difficulty urinating, vaginal pain and pelvic pain.  Musculoskeletal: Positive for back pain. Negative for arthralgias, gait problem and joint swelling.  Skin: Negative for rash.    Neurological: Negative for dizziness, syncope, speech difficulty, weakness, numbness and headaches.  Hematological: Negative for adenopathy.  Psychiatric/Behavioral: Negative for behavioral problems, dysphoric mood and agitation. The patient is not nervous/anxious.        Objective:   Physical Exam  Constitutional: She is oriented to person, place, and time. She appears well-developed and well-nourished.  HENT:  Head: Normocephalic.  Right Ear: External ear normal.  Left Ear: External ear normal.  Mouth/Throat: Oropharynx is clear and moist.  Eyes: Conjunctivae and EOM are normal. Pupils are equal, round, and reactive to light.  Neck: Normal range of motion. Neck supple. No thyromegaly present.  Cardiovascular: Normal rate, regular rhythm, normal heart sounds and intact distal pulses.   Pulmonary/Chest: Effort normal and breath sounds normal. No respiratory distress. She has no wheezes. She has no rales.  Abdominal: Soft. Bowel sounds are normal. She exhibits no mass. There is no tenderness.  Musculoskeletal: Normal range of motion.  Lymphadenopathy:    She has no cervical adenopathy.  Neurological: She is alert and oriented to person, place, and time.  Skin: Skin is warm and dry. No rash noted.  Psychiatric: She has a normal mood and affect. Her behavior is normal.          Assessment & Plan:   Viral URI with cough and hoarseness.  We'll treated symptomatically Hypertension Diabetes.  We'll check a followup hemoglobin A1c Chronic low back pain.  Followup orthopedics

## 2013-12-29 NOTE — Progress Notes (Signed)
Pre visit review using our clinic review tool, if applicable. No additional management support is needed unless otherwise documented below in the visit note. 

## 2014-01-05 ENCOUNTER — Other Ambulatory Visit: Payer: Self-pay | Admitting: Internal Medicine

## 2014-02-16 ENCOUNTER — Telehealth: Payer: Self-pay | Admitting: Internal Medicine

## 2014-02-16 NOTE — Telephone Encounter (Signed)
Pt was referral to dr Ethelene Hal for back pain and dr Ethelene Hal referred pt to dr brooks in same office for stimulator in back or surgery. Pt has [employer]. Pt is aware she does not needs a referral and will contact [facility]

## 2014-02-21 ENCOUNTER — Telehealth: Payer: Self-pay | Admitting: Internal Medicine

## 2014-02-21 ENCOUNTER — Encounter: Payer: Self-pay | Admitting: Internal Medicine

## 2014-02-21 MED ORDER — HYDROCODONE-HOMATROPINE 5-1.5 MG/5ML PO SYRP: ORAL_SOLUTION | ORAL | Status: DC

## 2014-02-21 NOTE — Telephone Encounter (Signed)
Please advise 

## 2014-02-21 NOTE — Telephone Encounter (Signed)
OK 6 OZ 

## 2014-02-21 NOTE — Telephone Encounter (Signed)
Left detailed message Rx ready for pickup will be at the front desk. Rx printed and signed. 

## 2014-02-21 NOTE — Telephone Encounter (Signed)
Pt called to say that she has congestion and would like to know if she can get an rx for HYDROcodone-homatropine (HYCODAN) 5-1.5 MG/5ML syrup     [facility] [place]

## 2014-02-22 NOTE — Telephone Encounter (Signed)
Spoke to pt, told her sorry but I can not E-scribe Hydrocodone, it is our policy and she will have to pick up Rx. It is at the front desk ready for her. Pt verbalized understanding.

## 2014-02-22 NOTE — Telephone Encounter (Signed)
Pt stated the laws has changed and [facility]

## 2014-03-21 ENCOUNTER — Telehealth: Payer: Self-pay

## 2014-03-21 NOTE — Telephone Encounter (Signed)
Left a message for call back.  Called patient regarding diabetic eye exam.  When patient calls back please ask:  Have you had a recent (2014-2015) eye exam?    Date of Exam?  Where?    

## 2014-04-04 ENCOUNTER — Telehealth: Payer: Self-pay | Admitting: *Deleted

## 2014-04-04 NOTE — Telephone Encounter (Signed)
Left message on voicemail to call office.  

## 2014-04-05 NOTE — Telephone Encounter (Signed)
Spoke to pt, told her received fax from [facility] regarding diabetic supplies, is she getting them from them. Pt said no, she is using local pharmacy for supplies. Told pt okay, just wanted to check will deny prescription. Pt verbalized understanding.

## 2014-04-07 ENCOUNTER — Ambulatory Visit: Payer: Medicare Other

## 2014-04-07 ENCOUNTER — Other Ambulatory Visit: Payer: Self-pay | Admitting: Internal Medicine

## 2014-04-07 ENCOUNTER — Telehealth: Payer: Self-pay | Admitting: Internal Medicine

## 2014-04-07 MED ORDER — CLONAZEPAM 1 MG PO TABS: ORAL_TABLET | ORAL | Status: DC

## 2014-04-07 NOTE — Telephone Encounter (Signed)
Pt request refill clonazePAM (KLONOPIN) 1 MG tablet  pt will be [facility] on Sunday. [facility] [place]

## 2014-04-07 NOTE — Telephone Encounter (Signed)
Pt notified Rx called into pharmacy 

## 2014-04-10 ENCOUNTER — Ambulatory Visit: Payer: Medicare Other

## 2014-04-10 ENCOUNTER — Ambulatory Visit (INDEPENDENT_AMBULATORY_CARE_PROVIDER_SITE_OTHER): Payer: Medicare Other

## 2014-04-10 DIAGNOSIS — Z23 Encounter for immunization: Secondary | ICD-10-CM

## 2014-05-08 ENCOUNTER — Telehealth: Payer: Self-pay | Admitting: Internal Medicine

## 2014-05-08 MED ORDER — FREESTYLE LANCETS MISC: Status: DC

## 2014-05-08 MED ORDER — GLUCOSE BLOOD VI STRP: ORAL_STRIP | Status: DC

## 2014-05-08 NOTE — Telephone Encounter (Signed)
Left message on voicemail to call office. Need to know husband name and birth date, Rx's sent to pharmacy for pt.

## 2014-05-08 NOTE — Telephone Encounter (Signed)
No call back from patient.  Encounter closed.   

## 2014-05-08 NOTE — Telephone Encounter (Signed)
Need Rx for freestyle lancets [facility] [place]. Both she and her husband are out and she does not know what size lancets they use. They are totally out.

## 2014-05-09 NOTE — Telephone Encounter (Signed)
Pt notified her and her husbands test strips and lancets were sent to pharmacy.

## 2014-05-09 NOTE — Telephone Encounter (Signed)
Audree BaneJames B Delucchi  [birth year] Also out of the test strips.  They have one meter so [facility] the [facility] strips. [facility]

## 2014-05-15 ENCOUNTER — Telehealth: Payer: Self-pay | Admitting: Internal Medicine

## 2014-05-15 MED ORDER — GLUCOSE BLOOD VI STRP
ORAL_STRIP | Status: DC
Start: 2014-05-15 — End: 2022-02-28

## 2014-05-15 MED ORDER — FREESTYLE LANCETS MISC: Status: DC

## 2014-05-15 NOTE — Telephone Encounter (Signed)
Pt needs rx for lancets sent to  [facility] [place] °Pt test 1/day. °

## 2014-05-15 NOTE — Telephone Encounter (Signed)
Pt states that is ok if insurance will pay. For both pt and husband.

## 2014-05-15 NOTE — Telephone Encounter (Signed)
Spoke to pt, told her [facility] [place]. Pt verbalized understanding and stated she is going to check with [facility] and see if they will send her new meter and get test strips and lancets from them even if she has to change meter. Told her that is fine just let me know if I need to send Rx's to them but meanwhile [facility]. Pt verbalized understanding.

## 2014-05-15 NOTE — Telephone Encounter (Signed)
Called the pharmacy and they have been working on this since last week.  The insurance continues to state they will not pay for the lancets.  The problem is that pt has a HMO plan and [facility] is not [facility] this plan. [facility] pharmacy [facility] [place] be and a prescription can be sent there if needed.   Left a message to see if pt would like to [facility].

## 2014-06-13 ENCOUNTER — Ambulatory Visit (INDEPENDENT_AMBULATORY_CARE_PROVIDER_SITE_OTHER): Payer: Medicare Other | Admitting: Internal Medicine

## 2014-06-13 ENCOUNTER — Encounter: Payer: Self-pay | Admitting: Internal Medicine

## 2014-06-13 DIAGNOSIS — E119 Type 2 diabetes mellitus without complications: Secondary | ICD-10-CM

## 2014-06-13 DIAGNOSIS — I10 Essential (primary) hypertension: Secondary | ICD-10-CM

## 2014-06-13 DIAGNOSIS — E785 Hyperlipidemia, unspecified: Secondary | ICD-10-CM

## 2014-06-13 DIAGNOSIS — J4520 Mild intermittent asthma, uncomplicated: Secondary | ICD-10-CM

## 2014-06-13 DIAGNOSIS — Z23 Encounter for immunization: Secondary | ICD-10-CM

## 2014-06-13 LAB — CBC WITH DIFFERENTIAL/PLATELET
BASOS ABS: 0.1 10*3/uL (ref 0.0–0.1)
BASOS PCT: 0.7 % (ref 0.0–3.0)
EOS PCT: 2.7 % (ref 0.0–5.0)
Eosinophils Absolute: 0.2 10*3/uL (ref 0.0–0.7)
HEMATOCRIT: 38.2 % (ref 36.0–46.0)
Hemoglobin: 12.9 g/dL (ref 12.0–15.0)
LYMPHS ABS: 3.3 10*3/uL (ref 0.7–4.0)
Lymphocytes Relative: 41.6 % (ref 12.0–46.0)
MCHC: 33.9 g/dL (ref 30.0–36.0)
MCV: 84.9 fl (ref 78.0–100.0)
Monocytes Absolute: 0.6 10*3/uL (ref 0.1–1.0)
Monocytes Relative: 7.2 % (ref 3.0–12.0)
Neutro Abs: 3.8 10*3/uL (ref 1.4–7.7)
Neutrophils Relative %: 47.8 % (ref 43.0–77.0)
PLATELETS: 200 10*3/uL (ref 150.0–400.0)
RBC: 4.49 Mil/uL (ref 3.87–5.11)
RDW: 12.7 % (ref 11.5–15.5)
WBC: 8 10*3/uL (ref 4.0–10.5)

## 2014-06-13 LAB — COMPREHENSIVE METABOLIC PANEL
ALT: 21 U/L (ref 0–35)
AST: 22 U/L (ref 0–37)
Albumin: 3.9 g/dL (ref 3.5–5.2)
Alkaline Phosphatase: 100 U/L (ref 39–117)
BILIRUBIN TOTAL: 0.9 mg/dL (ref 0.2–1.2)
BUN: 18 mg/dL (ref 6–23)
CO2: 29 meq/L (ref 19–32)
Calcium: 9.1 mg/dL (ref 8.4–10.5)
Chloride: 103 mEq/L (ref 96–112)
Creatinine, Ser: 0.7 mg/dL (ref 0.4–1.2)
GFR: 87.37 mL/min (ref >60.00)
Glucose, Bld: 98 mg/dL (ref 70–99)
POTASSIUM: 4 meq/L (ref 3.5–5.1)
SODIUM: 138 meq/L (ref 135–145)
TOTAL PROTEIN: 7.1 g/dL (ref 6.0–8.3)

## 2014-06-13 LAB — MICROALBUMIN / CREATININE URINE RATIO
CREATININE, U: 123.6 mg/dL
MICROALB/CREAT RATIO: 1.4 mg/g (ref 0.0–30.0)
Microalb, Ur: 1.7 mg/dL (ref 0.0–1.9)

## 2014-06-13 LAB — LIPID PANEL
CHOL/HDL RATIO: 8
Cholesterol: 238 mg/dL — ABNORMAL HIGH (ref 0–200)
HDL: 29.2 mg/dL — AB (ref >39.00)
NONHDL: 208.8
VLDL: 143.8 mg/dL — ABNORMAL HIGH (ref 0.0–40.0)

## 2014-06-13 LAB — TSH: TSH: 1.76 u[IU]/mL (ref 0.35–4.50)

## 2014-06-13 LAB — HEMOGLOBIN A1C: HEMOGLOBIN A1C: 5.6 % (ref 4.6–6.5)

## 2014-06-13 LAB — LDL CHOLESTEROL, DIRECT: LDL DIRECT: 78.3 mg/dL

## 2014-06-13 NOTE — Patient Instructions (Signed)
Please check your hemoglobin A1c every 3 months  Limit your sodium (Salt) intake    It is important that you exercise regularly, at least 20 minutes 3 to 4 times per week.  If you develop chest pain or shortness of breath seek  medical attention.   

## 2014-06-13 NOTE — Progress Notes (Signed)
Pre visit review using our clinic review tool, if applicable. No additional management support is needed unless otherwise documented below in the visit note. 

## 2014-06-13 NOTE — Progress Notes (Signed)
Subjective:    Patient ID: Stephanie Gonzalez, female    DOB: [birth year], 74 y.o.   MRN: [id number]  HPI [id number] year old patient who is seen today for preoperative clearance.  She is scheduled for the placement of a spinal cord stimulator due to chronic low back pain Clinically she has done quite well, although she is seen infrequently.  In spite of her back pain, she is able to negotiate stairs without difficulty.  Denies any exertional chest pain or shortness of breath. She has diabetes but no recent hemoglobin A1c.  She has treated hypertension and dyslipidemia. Other than her chronic back and leg pain.  She has no focal complaints.  Denies any cardiopulmonary complaints.  She does have a history of asthma but uses her inhaler very rarely and only in the setting of a URI  Past Medical History  Diagnosis Date  . ASTHMA 10/06/2007  . BURSITIS, RIGHT HIP 02/04/2010  . DIABETES MELLITUS, TYPE II 11/01/2009  . HYPERLIPIDEMIA 10/06/2007  . HYPERTENSION 10/06/2007    History   Social History  . Marital Status: Married    Spouse Name: N/A    Number of Children: N/A  . Years of Education: N/A   Occupational History  . Not on file.   Social History Main Topics  . Smoking status: Never Smoker   . Smokeless tobacco: Never Used  . Alcohol Use: Yes     Comment: rarely  . Drug Use: No  . Sexual Activity: Not on file   Other Topics Concern  . Not on file   Social History Narrative    Past Surgical History  Procedure Laterality Date  . Appendectomy    . Cholecystectomy    . Abdominal hysterectomy    . Tubal ligation    . Tonsillectomy      No family history on file.  Allergies  Allergen Reactions  . Tetracycline     REACTION: rash, agitation    Current Outpatient Prescriptions on File Prior to Visit  Medication Sig Dispense Refill  . albuterol (PROAIR HFA) 108 (90 BASE) MCG/ACT inhaler Inhale 2 puffs into the lungs every 6 (six) hours as needed.      Marland Kitchen. amLODipine (NORVASC) 5 MG  tablet Take 1 tablet (5 mg total) by mouth daily. 90 tablet 1  . atorvastatin (LIPITOR) 40 MG tablet Take 1 tablet (40 mg total) by mouth daily. 90 tablet 1  . clonazePAM (KLONOPIN) 1 MG tablet TAKE 1 TABLET BY MOUTH AT BEDTIME 90 tablet 1  . glucose blood (FREESTYLE LITE) test strip USE TO CHECK BLOOD SUGAR DAILY AND PRN 100 each 12  . HYDROcodone-acetaminophen (NORCO) 10-325 MG per tablet TAKE 1 TABLET BY MOUTH EVERY SIX HOURS AS NEEDED FOR PAIN 30 tablet 1  . HYDROcodone-homatropine (HYCODAN) 5-1.5 MG/5ML syrup TAKE 1 TEASPOONFUL EVERY 6 HOURS AS NEEDED 120 mL 0  . Lancets (FREESTYLE) lancets USE TO CHECK BLOOD SUGAR DAILY AND PRN 100 each 12  . metFORMIN (GLUCOPHAGE) 500 MG tablet Take 500 mg by mouth daily with breakfast.    . nabumetone (RELAFEN) 500 MG tablet Take 500 mg by mouth 2 (two) times daily.    . sertraline (ZOLOFT) 100 MG tablet Take one-half tablet by  mouth daily 45 tablet 2   No current facility-administered medications on file prior to visit.    BP 140/78 mmHg  Pulse 63  Temp(Src) 97.5 F (36.4 C) (Oral)  Resp 20  Ht 5' 3.5" (1.613 m)  Wt 190 lb (86.183  kg)  BMI 33.12 kg/m2  SpO2 98%      Review of Systems  Constitutional: Negative.   HENT: Negative for congestion, dental problem, hearing loss, rhinorrhea, sinus pressure, sore throat and tinnitus.   Eyes: Negative for pain, discharge and visual disturbance.  Respiratory: Negative for cough and shortness of breath.   Cardiovascular: Negative for chest pain, palpitations and leg swelling.  Gastrointestinal: Negative for nausea, vomiting, abdominal pain, diarrhea, constipation, blood in stool and abdominal distention.  Genitourinary: Negative for dysuria, urgency, frequency, hematuria, flank pain, vaginal bleeding, vaginal discharge, difficulty urinating, vaginal pain and pelvic pain.  Musculoskeletal: Negative for joint swelling, arthralgias and gait problem.  Skin: Negative for rash.  Neurological: Negative  for dizziness, syncope, speech difficulty, weakness, numbness and headaches.  Hematological: Negative for adenopathy.  Psychiatric/Behavioral: Negative for behavioral problems, dysphoric mood and agitation. The patient is not nervous/anxious.        Objective:   Physical Exam  Constitutional: She is oriented to person, place, and time. She appears well-developed and well-nourished.  HENT:  Head: Normocephalic.  Right Ear: External ear normal.  Left Ear: External ear normal.  Mouth/Throat: Oropharynx is clear and moist.  Eyes: Conjunctivae and EOM are normal. Pupils are equal, round, and reactive to light.  Neck: Normal range of motion. Neck supple. No thyromegaly present.  Cardiovascular: Normal rate, regular rhythm, normal heart sounds and intact distal pulses.   Pulmonary/Chest: Effort normal and breath sounds normal.  Abdominal: Soft. Bowel sounds are normal. She exhibits no mass. There is no tenderness.  Musculoskeletal: Normal range of motion.  Lymphadenopathy:    She has no cervical adenopathy.  Neurological: She is alert and oriented to person, place, and time.  Skin: Skin is warm and dry. No rash noted.  Psychiatric: She has a normal mood and affect. Her behavior is normal.          Assessment & Plan:   Diabetes mellitus.  Will check a hemoglobin A1c and urine for microalbumin Hypertension, well-controlled Dyslipidemia.  Continue atorvastatin.  Will check a lipid profile  Asthma, stable Chronic back pain   No contraindications to the anticipated surgical procedure Return in 6 months for follow-up

## 2014-07-10 ENCOUNTER — Encounter (HOSPITAL_COMMUNITY): Payer: Self-pay

## 2014-07-10 ENCOUNTER — Encounter (HOSPITAL_COMMUNITY)
Admission: RE | Admit: 2014-07-10 | Discharge: 2014-07-10 | Disposition: A | Discharge status: Home / Self Care | Payer: Medicare Other | Source: Ambulatory Visit | Attending: Orthopedic Surgery | Admitting: Orthopedic Surgery

## 2014-07-10 DIAGNOSIS — E118 Type 2 diabetes mellitus with unspecified complications: Secondary | ICD-10-CM | POA: Diagnosis not present

## 2014-07-10 DIAGNOSIS — F329 Major depressive disorder, single episode, unspecified: Secondary | ICD-10-CM | POA: Diagnosis not present

## 2014-07-10 DIAGNOSIS — G894 Chronic pain syndrome: Secondary | ICD-10-CM | POA: Diagnosis not present

## 2014-07-10 DIAGNOSIS — E780 Pure hypercholesterolemia: Secondary | ICD-10-CM | POA: Diagnosis not present

## 2014-07-10 DIAGNOSIS — Z881 Allergy status to other antibiotic agents status: Secondary | ICD-10-CM | POA: Diagnosis not present

## 2014-07-10 DIAGNOSIS — I10 Essential (primary) hypertension: Secondary | ICD-10-CM | POA: Diagnosis not present

## 2014-07-10 DIAGNOSIS — Z79899 Other long term (current) drug therapy: Secondary | ICD-10-CM | POA: Diagnosis not present

## 2014-07-10 HISTORY — DX: Restless legs syndrome: G25.81

## 2014-07-10 HISTORY — DX: Family history of other specified conditions: Z84.89

## 2014-07-10 LAB — CBC
HCT: 39 % (ref 36.0–46.0)
Hemoglobin: 13.2 g/dL (ref 12.0–15.0)
MCH: 28.5 pg (ref 26.0–34.0)
MCHC: 33.8 g/dL (ref 30.0–36.0)
MCV: 84.2 fL (ref 78.0–100.0)
PLATELETS: 181 10*3/uL (ref 150–400)
RBC: 4.63 MIL/uL (ref 3.87–5.11)
RDW: 13.2 % (ref 11.5–15.5)
WBC: 8.9 10*3/uL (ref 4.0–10.5)

## 2014-07-10 LAB — BASIC METABOLIC PANEL
ANION GAP: 7 (ref 5–15)
BUN: 17 mg/dL (ref 6–23)
CALCIUM: 9.5 mg/dL (ref 8.4–10.5)
CO2: 29 mmol/L (ref 19–32)
CREATININE: 0.76 mg/dL (ref 0.50–1.10)
Chloride: 104 mmol/L (ref 96–112)
GFR calc Af Amer: >90 mL/min (ref >90)
GFR calc non Af Amer: 82 mL/min — ABNORMAL LOW (ref >90)
Glucose, Bld: 97 mg/dL (ref 70–99)
Potassium: 4 mmol/L (ref 3.5–5.1)
SODIUM: 140 mmol/L (ref 135–145)

## 2014-07-10 LAB — GLUCOSE, CAPILLARY: Glucose-Capillary: 89 mg/dL (ref 70–99)

## 2014-07-10 LAB — SURGICAL PCR SCREEN
MRSA, PCR: NEGATIVE
STAPHYLOCOCCUS AUREUS: NEGATIVE

## 2014-07-10 NOTE — Progress Notes (Signed)
Patient denies CP/ Shob, cardiac test, PCP Dr. Amador CunasKwiatkowski

## 2014-07-10 NOTE — Pre-Procedure Instructions (Signed)
Stephanie RamusSue M Gonzalez  07/10/2014   Your procedure is scheduled on:  February 4  Report to [facility] Admitting at 05:30 AM.  Call this number if you have problems the morning of surgery: [phone]   Remember:   Do not eat food or drink liquids after midnight.   Take these medicines the morning of surgery with A SIP OF WATER: Albuterol (if needed), Amlodipine, Hydrocodone (if needed),    STOP Vitamin B12, Lysine, B Complex, relafen today   STOP/ Do not take Aspirin, Aleve, Naproxen, Advil, Ibuprofen, Motrin, Vitamins, Herbs, or Supplements starting today   Do not wear jewelry, make-up or nail polish.  Do not wear lotions, powders, or perfumes. You may wear deodorant.  Do not shave 48 hours prior to surgery. Men may shave face and neck.  Do not bring valuables to the hospital.  [facility] is not responsible  for any belongings or valuables.               Contacts, dentures or bridgework may not be worn into surgery.  Leave suitcase in the car. After surgery it may be brought to your room.  For patients admitted to the hospital, discharge time is determined by your treatment team.               Patients discharged the day of surgery will not be allowed to drive home.  Name and phone number of your driver: Family/ friend  Special Instructions: [place] - Preparing for Surgery  Before surgery, you can play an important role.  Because skin is not sterile, your skin needs to be as free of germs as possible.  You can reduce the number of germs on you skin by washing with CHG (chlorahexidine gluconate) soap before surgery.  CHG is an antiseptic cleaner which kills germs and bonds with the skin to continue killing germs even after washing.  Please DO NOT use if you have an allergy to CHG or antibacterial soaps.  If your skin becomes reddened/irritated stop using the CHG and inform your nurse when you arrive at Short Stay.  Do not shave (including legs and underarms) for at least 48  hours prior to the first CHG shower.  You may shave your face.  Please follow these instructions carefully:   1.  Shower with CHG Soap the night before surgery and the morning of Surgery.  2.  If you choose to wash your hair, wash your hair first as usual with your normal shampoo.  3.  After you shampoo, rinse your hair and body thoroughly to remove the shampoo.  4.  Use CHG as you would any other liquid soap.  You can apply CHG directly to the skin and wash gently with scrungie or a clean washcloth.  5.  Apply the CHG Soap to your body ONLY FROM THE NECK DOWN.  Do not use on open wounds or open sores.  Avoid contact with your eyes, ears, mouth and genitals (private parts).  Wash genitals (private parts) with your normal soap.  6.  Wash thoroughly, paying special attention to the area where your surgery will be performed.  7.  Thoroughly rinse your body with warm water from the neck down.  8.  DO NOT shower/wash with your normal soap after using and rinsing off the CHG Soap.  9.  Pat yourself dry with a clean towel.            10.  Wear clean pajamas.  11.  Place clean sheets on your bed the night of your first shower and do not sleep with pets.  Day of Surgery  Do not apply any lotions the morning of surgery.  Please wear clean clothes to the hospital/surgery center.     Please read over the following fact sheets that you were given: Pain Booklet, Coughing and Deep Breathing and Surgical Site Infection Prevention

## 2014-07-11 NOTE — Progress Notes (Signed)
Anesthesia Chart Review:  Patient is a 74 year old female scheduled for spinal cord stimulator insertion on 07/13/14 by Dr. Shon BatonBrooks.  History includes non-smoker, asthma, DM2, HLD, HTN, RLS, family history of post-operative N/V. BMI is consistent with obesity.  PCP is Dr. Amador CunasKwiatkowski, last visit 06/13/14. He did not think there was any contraindications to planned surgery.    07/10/14 EKG: NSR, possible LAE, septal infarct (age undetermined).  I don't think it is significantly changed when compared to EKG tracing from 10/24/11.  No SOB, CP reported at PAT.  Preoperative labs noted.  A1C 06/13/14 was 5.6.  Anticipate that she can proceed as planned.  Stephanie Chockllison Christophor Eick, PA-C [facility] Short Stay Center/Anesthesiology Phone [phone] 07/11/2014 10:00 AM

## 2014-07-12 MED ORDER — ACETAMINOPHEN 10 MG/ML IV SOLN
1000.0000 mg | Freq: Four times a day (QID) | INTRAVENOUS | Status: DC
  Administered 2014-07-13: 1000 mg via INTRAVENOUS

## 2014-07-12 MED ORDER — CEFAZOLIN SODIUM-DEXTROSE 2-3 GM-% IV SOLR
2.0000 g | INTRAVENOUS | Status: AC
  Administered 2014-07-13: 2 g via INTRAVENOUS
  Filled 2014-07-12: qty 50

## 2014-07-12 NOTE — Anesthesia Preprocedure Evaluation (Addendum)
Anesthesia Evaluation  Patient identified by MRN, date of birth, ID band Patient awake    Reviewed: Allergy & Precautions, NPO status , Patient's Chart, lab work & pertinent test results, reviewed documented beta blocker date and time   Airway Mallampati: II   Neck ROM: Full    Dental  (+) Caps, Teeth Intact, Dental Advisory Given   Pulmonary asthma ,  breath sounds clear to auscultation        Cardiovascular hypertension, Pt. on medications Rhythm:Regular  EKG septal MI unchanged tracing   Neuro/Psych    GI/Hepatic negative GI ROS, Neg liver ROS,   Endo/Other  diabetes, Well Controlled, Type 2  Renal/GU GFR 82     Musculoskeletal   Abdominal (+) + obese,   Peds  Hematology 13/39 H/H   Anesthesia Other Findings   Reproductive/Obstetrics                           Anesthesia Physical Anesthesia Plan  ASA: III  Anesthesia Plan: General   Post-op Pain Management:    Induction: Intravenous  Airway Management Planned: Oral ETT  Additional Equipment:   Intra-op Plan:   Post-operative Plan: Extubation in OR  Informed Consent: I have reviewed the patients History and Physical, chart, labs and discussed the procedure including the risks, benefits and alternatives for the proposed anesthesia with the patient or authorized representative who has indicated his/her understanding and acceptance.     Plan Discussed with:   Anesthesia Plan Comments:         Anesthesia Quick Evaluation

## 2014-07-13 ENCOUNTER — Ambulatory Visit (HOSPITAL_COMMUNITY): Payer: Medicare Other

## 2014-07-13 ENCOUNTER — Ambulatory Visit (HOSPITAL_COMMUNITY): Payer: Medicare Other | Admitting: Vascular Surgery

## 2014-07-13 ENCOUNTER — Encounter (HOSPITAL_COMMUNITY): Payer: Self-pay | Admitting: Anesthesiology

## 2014-07-13 ENCOUNTER — Encounter (HOSPITAL_COMMUNITY)
Admission: RE | Disposition: A | Discharge status: Home / Self Care | Payer: Self-pay | Source: Ambulatory Visit | Attending: Orthopedic Surgery

## 2014-07-13 ENCOUNTER — Ambulatory Visit (HOSPITAL_COMMUNITY): Payer: Medicare Other | Admitting: Anesthesiology

## 2014-07-13 ENCOUNTER — Observation Stay (HOSPITAL_COMMUNITY)
Admission: RE | Admit: 2014-07-13 | Discharge: 2014-07-14 | Disposition: A | Discharge status: Home / Self Care | Payer: Medicare Other | Source: Ambulatory Visit | Attending: Orthopedic Surgery | Admitting: Orthopedic Surgery

## 2014-07-13 DIAGNOSIS — Z881 Allergy status to other antibiotic agents status: Secondary | ICD-10-CM | POA: Insufficient documentation

## 2014-07-13 DIAGNOSIS — E780 Pure hypercholesterolemia: Secondary | ICD-10-CM | POA: Insufficient documentation

## 2014-07-13 DIAGNOSIS — I10 Essential (primary) hypertension: Secondary | ICD-10-CM | POA: Insufficient documentation

## 2014-07-13 DIAGNOSIS — Z79899 Other long term (current) drug therapy: Secondary | ICD-10-CM | POA: Insufficient documentation

## 2014-07-13 DIAGNOSIS — F329 Major depressive disorder, single episode, unspecified: Secondary | ICD-10-CM | POA: Insufficient documentation

## 2014-07-13 DIAGNOSIS — E118 Type 2 diabetes mellitus with unspecified complications: Secondary | ICD-10-CM | POA: Diagnosis not present

## 2014-07-13 DIAGNOSIS — G8929 Other chronic pain: Secondary | ICD-10-CM | POA: Diagnosis present

## 2014-07-13 DIAGNOSIS — Z419 Encounter for procedure for purposes other than remedying health state, unspecified: Secondary | ICD-10-CM

## 2014-07-13 DIAGNOSIS — G894 Chronic pain syndrome: Secondary | ICD-10-CM | POA: Diagnosis not present

## 2014-07-13 HISTORY — PX: SPINAL CORD STIMULATOR INSERTION: SHX5378

## 2014-07-13 LAB — GLUCOSE, CAPILLARY
GLUCOSE-CAPILLARY: 149 mg/dL — AB (ref 70–99)
GLUCOSE-CAPILLARY: 161 mg/dL — AB (ref 70–99)
Glucose-Capillary: 110 mg/dL — ABNORMAL HIGH (ref 70–99)
Glucose-Capillary: 125 mg/dL — ABNORMAL HIGH (ref 70–99)
Glucose-Capillary: 134 mg/dL — ABNORMAL HIGH (ref 70–99)

## 2014-07-13 SURGERY — INSERTION, SPINAL CORD STIMULATOR, LUMBAR
Anesthesia: General | Site: Back

## 2014-07-13 MED ORDER — LACTATED RINGERS IV SOLN
INTRAVENOUS | Status: DC | PRN
  Administered 2014-07-13 (×2): via INTRAVENOUS

## 2014-07-13 MED ORDER — FENTANYL CITRATE 0.05 MG/ML IJ SOLN
25.0000 ug | INTRAMUSCULAR | Status: DC | PRN
  Administered 2014-07-13 (×2): 50 ug via INTRAVENOUS

## 2014-07-13 MED ORDER — ROCURONIUM BROMIDE 100 MG/10ML IV SOLN
INTRAVENOUS | Status: DC | PRN
  Administered 2014-07-13: 50 mg via INTRAVENOUS

## 2014-07-13 MED ORDER — GLYCOPYRROLATE 0.2 MG/ML IJ SOLN
INTRAMUSCULAR | Status: AC
  Filled 2014-07-13: qty 1

## 2014-07-13 MED ORDER — THROMBIN 20000 UNITS EX SOLR
CUTANEOUS | Status: DC | PRN
  Administered 2014-07-13: 20000 [IU] via TOPICAL

## 2014-07-13 MED ORDER — THROMBIN 20000 UNITS EX SOLR
CUTANEOUS | Status: AC
  Filled 2014-07-13: qty 20000

## 2014-07-13 MED ORDER — PROPOFOL 10 MG/ML IV BOLUS
INTRAVENOUS | Status: DC | PRN
  Administered 2014-07-13: 150 mg via INTRAVENOUS

## 2014-07-13 MED ORDER — CEFAZOLIN SODIUM 1-5 GM-% IV SOLN
1.0000 g | Freq: Three times a day (TID) | INTRAVENOUS | Status: AC
  Administered 2014-07-13 (×2): 1 g via INTRAVENOUS
  Filled 2014-07-13 (×2): qty 50

## 2014-07-13 MED ORDER — SODIUM CHLORIDE 0.9 % IJ SOLN: 3.0000 mL | INTRAMUSCULAR | Status: DC | PRN

## 2014-07-13 MED ORDER — ALBUTEROL SULFATE HFA 108 (90 BASE) MCG/ACT IN AERS: 2.0000 | INHALATION_SPRAY | Freq: Four times a day (QID) | RESPIRATORY_TRACT | Status: DC | PRN

## 2014-07-13 MED ORDER — MENTHOL 3 MG MT LOZG: 1.0000 | LOZENGE | OROMUCOSAL | Status: DC | PRN

## 2014-07-13 MED ORDER — LACTATED RINGERS IV SOLN: INTRAVENOUS | Status: DC

## 2014-07-13 MED ORDER — SERTRALINE HCL 100 MG PO TABS
100.0000 mg | ORAL_TABLET | Freq: Every day | ORAL | Status: DC
  Administered 2014-07-13: 100 mg via ORAL
  Filled 2014-07-13 (×2): qty 1

## 2014-07-13 MED ORDER — ACETAMINOPHEN 10 MG/ML IV SOLN
1000.0000 mg | INTRAVENOUS | Status: DC
  Filled 2014-07-13: qty 100

## 2014-07-13 MED ORDER — NEOSTIGMINE METHYLSULFATE 10 MG/10ML IV SOLN
INTRAVENOUS | Status: AC
  Filled 2014-07-13: qty 1

## 2014-07-13 MED ORDER — FENTANYL CITRATE 0.05 MG/ML IJ SOLN
INTRAMUSCULAR | Status: DC | PRN
  Administered 2014-07-13: 50 ug via INTRAVENOUS
  Administered 2014-07-13: 25 ug via INTRAVENOUS
  Administered 2014-07-13 (×2): 50 ug via INTRAVENOUS
  Administered 2014-07-13: 25 ug via INTRAVENOUS

## 2014-07-13 MED ORDER — DEXAMETHASONE SODIUM PHOSPHATE 4 MG/ML IJ SOLN
INTRAMUSCULAR | Status: AC
  Filled 2014-07-13: qty 1

## 2014-07-13 MED ORDER — MEPERIDINE HCL 25 MG/ML IJ SOLN: 6.2500 mg | INTRAMUSCULAR | Status: DC | PRN

## 2014-07-13 MED ORDER — PROMETHAZINE HCL 25 MG/ML IJ SOLN: 6.2500 mg | INTRAMUSCULAR | Status: DC | PRN

## 2014-07-13 MED ORDER — EPHEDRINE SULFATE 50 MG/ML IJ SOLN
INTRAMUSCULAR | Status: AC
  Filled 2014-07-13: qty 1

## 2014-07-13 MED ORDER — MIDAZOLAM HCL 2 MG/2ML IJ SOLN
INTRAMUSCULAR | Status: AC
  Filled 2014-07-13: qty 2

## 2014-07-13 MED ORDER — LIDOCAINE HCL 4 % MT SOLN
OROMUCOSAL | Status: DC | PRN
  Administered 2014-07-13: 3 mL via TOPICAL

## 2014-07-13 MED ORDER — LIDOCAINE HCL (CARDIAC) 20 MG/ML IV SOLN
INTRAVENOUS | Status: DC | PRN
  Administered 2014-07-13: 100 mg via INTRAVENOUS

## 2014-07-13 MED ORDER — GLYCOPYRROLATE 0.2 MG/ML IJ SOLN
INTRAMUSCULAR | Status: AC
  Filled 2014-07-13: qty 3

## 2014-07-13 MED ORDER — GLYCOPYRROLATE 0.2 MG/ML IJ SOLN
INTRAMUSCULAR | Status: DC | PRN
  Administered 2014-07-13: 0.2 mg via INTRAVENOUS
  Administered 2014-07-13: 0.6 mg via INTRAVENOUS

## 2014-07-13 MED ORDER — OXYCODONE-ACETAMINOPHEN 10-325 MG PO TABS: 1.0000 | ORAL_TABLET | ORAL | Status: DC | PRN

## 2014-07-13 MED ORDER — ACETAMINOPHEN 10 MG/ML IV SOLN
1000.0000 mg | Freq: Four times a day (QID) | INTRAVENOUS | Status: AC
  Administered 2014-07-13 – 2014-07-14 (×4): 1000 mg via INTRAVENOUS
  Filled 2014-07-13 (×4): qty 100

## 2014-07-13 MED ORDER — 0.9 % SODIUM CHLORIDE (POUR BTL) OPTIME
TOPICAL | Status: DC | PRN
  Administered 2014-07-13: 1000 mL

## 2014-07-13 MED ORDER — ALBUTEROL SULFATE (2.5 MG/3ML) 0.083% IN NEBU: 2.5000 mg | INHALATION_SOLUTION | Freq: Four times a day (QID) | RESPIRATORY_TRACT | Status: DC | PRN

## 2014-07-13 MED ORDER — FENTANYL CITRATE 0.05 MG/ML IJ SOLN
INTRAMUSCULAR | Status: AC
  Administered 2014-07-13: 50 ug via INTRAVENOUS
  Filled 2014-07-13: qty 2

## 2014-07-13 MED ORDER — FENTANYL CITRATE 0.05 MG/ML IJ SOLN
INTRAMUSCULAR | Status: AC
  Filled 2014-07-13: qty 5

## 2014-07-13 MED ORDER — SCOPOLAMINE 1 MG/3DAYS TD PT72
1.0000 | MEDICATED_PATCH | Freq: Once | TRANSDERMAL | Status: DC
  Administered 2014-07-13: 1.5 mg via TRANSDERMAL

## 2014-07-13 MED ORDER — ONDANSETRON HCL 4 MG/2ML IJ SOLN
INTRAMUSCULAR | Status: AC
  Filled 2014-07-13: qty 2

## 2014-07-13 MED ORDER — ONDANSETRON HCL 4 MG/2ML IJ SOLN: 4.0000 mg | INTRAMUSCULAR | Status: DC | PRN

## 2014-07-13 MED ORDER — AMLODIPINE BESYLATE 5 MG PO TABS
5.0000 mg | ORAL_TABLET | Freq: Every day | ORAL | Status: DC
  Administered 2014-07-13: 5 mg via ORAL
  Filled 2014-07-13 (×2): qty 1

## 2014-07-13 MED ORDER — ONDANSETRON HCL 4 MG/2ML IJ SOLN
INTRAMUSCULAR | Status: DC | PRN
Start: 2014-07-13 — End: 2014-07-13
  Administered 2014-07-13: 4 mg via INTRAVENOUS

## 2014-07-13 MED ORDER — SODIUM CHLORIDE 0.9 % IJ SOLN
INTRAMUSCULAR | Status: AC
  Filled 2014-07-13: qty 10

## 2014-07-13 MED ORDER — ONDANSETRON HCL 4 MG PO TABS: 4.0000 mg | ORAL_TABLET | Freq: Three times a day (TID) | ORAL | Status: DC | PRN

## 2014-07-13 MED ORDER — DEXMEDETOMIDINE HCL 200 MCG/2ML IV SOLN
INTRAVENOUS | Status: DC | PRN
  Administered 2014-07-13 (×3): 8 ug via INTRAVENOUS

## 2014-07-13 MED ORDER — ATORVASTATIN CALCIUM 40 MG PO TABS
40.0000 mg | ORAL_TABLET | Freq: Every day | ORAL | Status: DC
  Administered 2014-07-13: 40 mg via ORAL
  Filled 2014-07-13 (×2): qty 1

## 2014-07-13 MED ORDER — DOCUSATE SODIUM 100 MG PO CAPS: 100.0000 mg | ORAL_CAPSULE | Freq: Three times a day (TID) | ORAL | Status: DC | PRN

## 2014-07-13 MED ORDER — BUPIVACAINE-EPINEPHRINE (PF) 0.25% -1:200000 IJ SOLN
INTRAMUSCULAR | Status: AC
  Filled 2014-07-13: qty 30

## 2014-07-13 MED ORDER — PHENOL 1.4 % MT LIQD
1.0000 | OROMUCOSAL | Status: DC | PRN
  Filled 2014-07-13: qty 177

## 2014-07-13 MED ORDER — PROPOFOL 10 MG/ML IV BOLUS
INTRAVENOUS | Status: AC
  Filled 2014-07-13: qty 20

## 2014-07-13 MED ORDER — METHOCARBAMOL 1000 MG/10ML IJ SOLN: 500.0000 mg | Freq: Four times a day (QID) | INTRAVENOUS | Status: DC | PRN

## 2014-07-13 MED ORDER — SUCCINYLCHOLINE CHLORIDE 20 MG/ML IJ SOLN
INTRAMUSCULAR | Status: AC
  Filled 2014-07-13: qty 1

## 2014-07-13 MED ORDER — SCOPOLAMINE 1 MG/3DAYS TD PT72
MEDICATED_PATCH | TRANSDERMAL | Status: AC
  Filled 2014-07-13: qty 1

## 2014-07-13 MED ORDER — INSULIN ASPART 100 UNIT/ML ~~LOC~~ SOLN: 0.0000 [IU] | SUBCUTANEOUS | Status: DC

## 2014-07-13 MED ORDER — ARTIFICIAL TEARS OP OINT
TOPICAL_OINTMENT | OPHTHALMIC | Status: DC | PRN
  Administered 2014-07-13: 1 via OPHTHALMIC

## 2014-07-13 MED ORDER — METFORMIN HCL 500 MG PO TABS
500.0000 mg | ORAL_TABLET | Freq: Every day | ORAL | Status: DC
  Administered 2014-07-14: 500 mg via ORAL
  Filled 2014-07-13 (×2): qty 1

## 2014-07-13 MED ORDER — ROCURONIUM BROMIDE 50 MG/5ML IV SOLN
INTRAVENOUS | Status: AC
  Filled 2014-07-13: qty 1

## 2014-07-13 MED ORDER — METHOCARBAMOL 500 MG PO TABS: 500.0000 mg | ORAL_TABLET | Freq: Three times a day (TID) | ORAL | Status: DC | PRN

## 2014-07-13 MED ORDER — DEXAMETHASONE SODIUM PHOSPHATE 4 MG/ML IJ SOLN
INTRAMUSCULAR | Status: DC | PRN
  Administered 2014-07-13: 4 mg via INTRAVENOUS

## 2014-07-13 MED ORDER — SODIUM CHLORIDE 0.9 % IV SOLN: 250.0000 mL | INTRAVENOUS | Status: DC

## 2014-07-13 MED ORDER — EPHEDRINE SULFATE 50 MG/ML IJ SOLN
INTRAMUSCULAR | Status: DC | PRN
  Administered 2014-07-13 (×2): 10 mg via INTRAVENOUS

## 2014-07-13 MED ORDER — MIDAZOLAM HCL 5 MG/5ML IJ SOLN
INTRAMUSCULAR | Status: DC | PRN
  Administered 2014-07-13: 2 mg via INTRAVENOUS

## 2014-07-13 MED ORDER — METHOCARBAMOL 500 MG PO TABS
500.0000 mg | ORAL_TABLET | Freq: Four times a day (QID) | ORAL | Status: DC | PRN
  Administered 2014-07-13: 500 mg via ORAL
  Filled 2014-07-13: qty 1

## 2014-07-13 MED ORDER — LIDOCAINE HCL (CARDIAC) 20 MG/ML IV SOLN
INTRAVENOUS | Status: AC
  Filled 2014-07-13: qty 10

## 2014-07-13 MED ORDER — MORPHINE SULFATE 2 MG/ML IJ SOLN: 1.0000 mg | INTRAMUSCULAR | Status: DC | PRN

## 2014-07-13 MED ORDER — ARTIFICIAL TEARS OP OINT
TOPICAL_OINTMENT | OPHTHALMIC | Status: AC
  Filled 2014-07-13: qty 3.5

## 2014-07-13 MED ORDER — BUPIVACAINE-EPINEPHRINE 0.25% -1:200000 IJ SOLN
INTRAMUSCULAR | Status: DC | PRN
  Administered 2014-07-13: 10 mL

## 2014-07-13 MED ORDER — NEOSTIGMINE METHYLSULFATE 10 MG/10ML IV SOLN
INTRAVENOUS | Status: DC | PRN
  Administered 2014-07-13: 4 mg via INTRAVENOUS
  Administered 2014-07-13: 1 mg via INTRAVENOUS

## 2014-07-13 MED ORDER — PHENYLEPHRINE 40 MCG/ML (10ML) SYRINGE FOR IV PUSH (FOR BLOOD PRESSURE SUPPORT)
PREFILLED_SYRINGE | INTRAVENOUS | Status: AC
  Filled 2014-07-13: qty 10

## 2014-07-13 MED ORDER — OXYCODONE HCL 5 MG PO TABS
10.0000 mg | ORAL_TABLET | ORAL | Status: DC | PRN
  Administered 2014-07-13 – 2014-07-14 (×4): 10 mg via ORAL
  Filled 2014-07-13 (×4): qty 2

## 2014-07-13 MED ORDER — SODIUM CHLORIDE 0.9 % IJ SOLN: 3.0000 mL | Freq: Two times a day (BID) | INTRAMUSCULAR | Status: DC

## 2014-07-13 SURGICAL SUPPLY — 57 items
CANISTER SUCTION 2500CC (MISCELLANEOUS) ×3 IMPLANT
CLOSURE STERI-STRIP 1/2X4 (GAUZE/BANDAGES/DRESSINGS) ×1
CLSR STERI-STRIP ANTIMIC 1/2X4 (GAUZE/BANDAGES/DRESSINGS) ×2 IMPLANT
CORDS BIPOLAR (ELECTRODE) ×3 IMPLANT
COVER PROBE W GEL 5X96 (DRAPES) IMPLANT
DRAPE C-ARM 42X72 X-RAY (DRAPES) ×3 IMPLANT
DRAPE INCISE IOBAN 85X60 (DRAPES) ×3 IMPLANT
DRAPE SURG 17X23 STRL (DRAPES) ×3 IMPLANT
DRAPE U-SHAPE 47X51 STRL (DRAPES) ×3 IMPLANT
DRSG MEPILEX BORDER 4X4 (GAUZE/BANDAGES/DRESSINGS) ×3 IMPLANT
DRSG MEPILEX BORDER 4X8 (GAUZE/BANDAGES/DRESSINGS) ×3 IMPLANT
DURAPREP 26ML APPLICATOR (WOUND CARE) ×3 IMPLANT
ELECT CAUTERY BLADE 6.4 (BLADE) ×3 IMPLANT
ELECT PENCIL ROCKER SW 15FT (MISCELLANEOUS) ×3 IMPLANT
ELECT REM PT RETURN 9FT ADLT (ELECTROSURGICAL) ×3
ELECTRODE REM PT RTRN 9FT ADLT (ELECTROSURGICAL) ×1 IMPLANT
GLOVE BIOGEL PI IND STRL 8 (GLOVE) ×1 IMPLANT
GLOVE BIOGEL PI IND STRL 8.5 (GLOVE) ×1 IMPLANT
GLOVE BIOGEL PI INDICATOR 8 (GLOVE) ×2
GLOVE BIOGEL PI INDICATOR 8.5 (GLOVE) ×2
GLOVE ECLIPSE 8.5 STRL (GLOVE) ×9 IMPLANT
GLOVE ORTHO TXT STRL SZ7.5 (GLOVE) ×3 IMPLANT
GOWN STRL REUS W/ TWL XL LVL3 (GOWN DISPOSABLE) ×2 IMPLANT
GOWN STRL REUS W/TWL 2XL LVL3 (GOWN DISPOSABLE) ×6 IMPLANT
GOWN STRL REUS W/TWL XL LVL3 (GOWN DISPOSABLE) ×4
IPG PROTEGE MRI (Neuro Prosthesis/Implant) ×3 IMPLANT
KIT BASIN OR (CUSTOM PROCEDURE TRAY) ×3 IMPLANT
KIT ROOM TURNOVER OR (KITS) ×3 IMPLANT
LAMI NARROW PRIPOLE 16CH (Orthopedic Implant) ×3 IMPLANT
NDL SUT 6 .5 CRC .975X.05 MAYO (NEEDLE) ×1 IMPLANT
NEEDLE 22X1 1/2 (OR ONLY) (NEEDLE) ×3 IMPLANT
NEEDLE MAYO TAPER (NEEDLE) ×2
NEEDLE SPNL 18GX3.5 QUINCKE PK (NEEDLE) ×9 IMPLANT
NS IRRIG 1000ML POUR BTL (IV SOLUTION) ×3 IMPLANT
PACK LAMINECTOMY ORTHO (CUSTOM PROCEDURE TRAY) ×3 IMPLANT
PACK UNIVERSAL I (CUSTOM PROCEDURE TRAY) ×3 IMPLANT
PAD ARMBOARD 7.5X6 YLW CONV (MISCELLANEOUS) ×6 IMPLANT
PATIENT PROGRAMMER PROTEGE MRI (MISCELLANEOUS) ×3 IMPLANT
SPONGE LAP 4X18 X RAY DECT (DISPOSABLE) IMPLANT
SPONGE SURGIFOAM ABS GEL 100 (HEMOSTASIS) ×3 IMPLANT
STAPLER VISISTAT 35W (STAPLE) ×3 IMPLANT
SURGIFLO TRUKIT (HEMOSTASIS) IMPLANT
SUT BONE WAX W31G (SUTURE) ×3 IMPLANT
SUT FIBERWIRE #2 38 REV NDL BL (SUTURE) ×3
SUT MON AB 3-0 SH 27 (SUTURE) ×4
SUT MON AB 3-0 SH27 (SUTURE) ×2 IMPLANT
SUT VIC AB 1 CT1 27 (SUTURE) ×6
SUT VIC AB 1 CT1 27XBRD ANBCTR (SUTURE) ×3 IMPLANT
SUT VIC AB 2-0 CT1 18 (SUTURE) ×3 IMPLANT
SUTURE FIBERWR#2 38 REV NDL BL (SUTURE) ×1 IMPLANT
SYR BULB IRRIGATION 50ML (SYRINGE) ×3 IMPLANT
SYR CONTROL 10ML LL (SYRINGE) ×3 IMPLANT
SYSTEM CHARGING PRODIGY (MISCELLANEOUS) ×3 IMPLANT
TOWEL OR 17X24 6PK STRL BLUE (TOWEL DISPOSABLE) ×3 IMPLANT
TOWEL OR 17X26 10 PK STRL BLUE (TOWEL DISPOSABLE) ×3 IMPLANT
TRAY FOLEY CATH 16FRSI W/METER (SET/KITS/TRAYS/PACK) IMPLANT
WATER STERILE IRR 1000ML POUR (IV SOLUTION) ×3 IMPLANT

## 2014-07-13 NOTE — Op Note (Signed)
NAMEREATHA, SUR NO.:  [id number]  MEDICAL RECORD NO.:  [id number]  LOCATION:  3C04C                        FACILITY:  [facility]  PHYSICIAN:  Alvy Beal, MD    DATE OF BIRTH:  12-23-1940  DATE OF PROCEDURE:  07/13/2014 DATE OF DISCHARGE:                              OPERATIVE REPORT   PREOPERATIVE DIAGNOSIS:  Chronic pain syndrome.  POSTOPERATIVE DIAGNOSE:  Chronic pain syndrome.  OPERATIVE PROCEDURE:  Implantation of spinal cord stimulator, T11 laminotomy with insertion of the device with secondary incision in the right lower gluteal region for implantation of the battery.  COMPLICATIONS:  None.  CONDITION:  Stable.  BATTERY USED:  [employer]'s Tripole lead.  This is a very pleasant 74 year old who has been having chronic debilitating pain.  She had a spinal cord stimulator and had significant relief.  As a result, she elected to proceed with implantation.  All appropriate risks, benefits, and alternatives were discussed with the patient and consent was obtained.  OPERATIVE NOTE:  The patient was brought to the operating room, placed supine on the operating table.  After successful induction of general anesthesia and endotracheal intubation, TEDs, SCDs were applied.  She was turned prone onto the Wilson frame and all bony prominences were well padded.  The back was prepped and draped in a standard fashion. Time-out was taken to confirm patient, procedure, and all other pertinent important details.  After the time-out, x-ray with fluoro was brought into the field in the lateral position, then I counted up from the L5 vertebral body up to the T10 vertebral body.  I then infiltrated the planned incision site and made an incision starting at the midportion of T10 spinous process and proceeding down to the T11-12 interspinous process location.  Sharp dissection was carried out down to the deep fascia.  Deep fascia was sharply incised and I dissected  along with a Cobb and Bovie to expose the inferior portion of T11 and T10 spinous processes.  I then dissected out over the lamina and exposed just the medial portion of the facet complex.  I then brought the x-ray machine back into the lateral position and counted up again from L5 confirming the T10 vertebral body.  Once this was done, I then removed the majority of the posterior aspect of the T10 spinous process.  I then used a small neuro curette to develop a plane underneath the lamina of T10, then using a 2-mm Kerrison, I performed a laminotomy of T10.  I then dissected through the central raphe of the ligamentum flavum and used my 2-mm Kerrison to resect the ligamentum flavum and exposed the dorsal surface of the thecal sac.  Once this was done, I advanced the trial soft dural spatula and this freely went up.  There was no significant compression.  There was no block to advancing it.  At this point, I took the actual implant and freely passed it up a single time so that it rested in the midline between the pedicles and spanning from the inferior portion of T8 across the T8-9 disk space and all of T9. This is exactly according to the rep where the  maximum amount of stimulation was being done for her improvement.  I then secured the lead directly to the T11 spinous process using FiberWire.  I then wrapped it around the T11 spinous process by passing it through the interspinous process ligament.  I then made a second incision on the right lower gluteal region and then dissected down 2.5 cm and created a pocket.  I then used my submuscular passer and delivered the wires into the battery site.  I then connected to the battery and we tested it while it was inside body and it was functioning fine.  At this point, I then wrapped the excess wire on the bottom surface of the battery and secured the battery with two #1 Vicryl sutures to the deep fascia.  All wounds were then copiously  irrigated with normal saline, and hemostasis was obtained using bipolar electrocautery.  The deep fascia of both wounds were closed with #1 Vicryl sutures, superficial with 2-0 Vicryl sutures, and 3-0 Monocryl for the skin.  Steri-Strips and dry dressing were applied, and the patient was extubated and transferred to the PACU without incident.  At the end of the case, all needle and sponge counts were correct.  There were no adverse intraoperative events.     Alvy Bealahari D Mollye Guinta, MD     DDB/MEDQ  D:  07/13/2014  T:  07/13/2014  Job:  [id number]

## 2014-07-13 NOTE — H&P (Signed)
History of Present Illness The patient is a 74 year old female who comes in today for a preoperative History and Physical. The patient is scheduled for a placement of SCS to be performed by Dr. Debria Garret D. Shon Baton, MD at [facility] on 07-13-14 . Please see the hospital record for complete dictated history and physical.  Allergies  Tetracyclines  Family History Rheumatoid Arthritis mother Chronic Obstructive Lung Disease father Cancer Mother, Father. mother and father  Social History  Marital status married Living situation live with spouse Illicit drug use no Tobacco use Never smoker. never smoker Pain Contract no Number of flights of stairs before winded 2-3 Current work status working part time Children 3 Alcohol use never consumed alcohol Exercise Exercises weekly; does running / walking Drug/Alcohol Rehab (Previously) no Drug/Alcohol Rehab (Currently) no  Medication History  Norco (10-325MG  Tablet, 1 Oral two times daily, as needed, Taken starting 07/06/2014) Active. Sertraline HCl (  Tablet, Oral) Active. (qd) Vitamin B12 ( Tablet, Oral) Active. (qd) Multi Vitamin Daily (Oral) Active. (qd) Zoloft (  Tablet, 0.5 Oral daily) Active. Lipitor (  Tablet, 1 Oral daily) Active. MetFORMIN HCl (  Tablet, 1 Oral) Active. (1/2 tab qd) ClonazePAM (  Tablet, 1 Oral daily) Active. (qhs for "restless legs") Medications Reconciled  Past Surgical History Appendectomy Dilation and Curettage of Uterus - Multiple Gallbladder Surgery laporoscopic Hysterectomy complete (non-cancerous) Tonsillectomy Tubal Ligation  Other Problems  Depression Diabetes Mellitus, Type II High blood pressure Hypercholesterolemia Kidney Stone  Vitals  07/10/2014 10:41 AM Weight: 188 lb Height: 64in Body Surface Area: 1.91 m Body Mass Index: 32.27 kg/m  Temp.: 98.16F(Oral)  Pulse: 73 (Regular)  BP: 150/69 (Sitting, Left Arm,  Standard)   Physical Exam General Mental Status -Alert. General Appearance-Very pleasant. Orientation-Oriented X3.  Chest and Lung Exam Chest and lung exam reveals -normal excursion with symmetric chest walls, non-tender and normal tactile fremitus and on auscultation, normal breath sounds, no adventitious sounds and normal vocal resonance.  Cardiovascular Cardiovascular examination reveals -on palpation PMI is normal in location and amplitude, no palpable S3 or S4. Normal cardiac borders. and normal pedal pulses bilaterally.  Peripheral Vascular Lower Extremity Palpation - Calf - Bilateral - soft/supple to palpation, appearance is not suggestive of DVT.  Neurologic Reflexes 1/2 Decreased - All. Babinski - Bilateral - Babinski not present. Clonus - Bilateral - clonus present.  Musculoskeletal Spine/Ribs/Pelvis  Lumbosacral Spine: Assessment of pain reveals the following findings - The pain is characterized as - severe, burning, pain accumulates with activity and pain increases with sustained postures. Location - pain refers laterally to right lower back. Location - lumbar area and right lateral lower leg. Lumbosacral Spine - ROM - painful. ROM - Testing limited - due to guarding and due to pain.   Thoracic MRI: no significant central stenosis.  Small left disc protrusion T8/9 and small central protrusion T7/8.  No contra-indication to paddle placement.  Assessment & Plan Chronic radicular lumbar pain  Note:Pre-op H+P completed MRI - no significant stenosis that would limit implantation Ricks/Benefits reviewed All questions addressed Plan on out of work x 3 months  Goal Of Surgery:Discussed that goal of surgery is to reduce pain and improve function and quality of life. Patient is aware that despite all appropriate treatment that there pain and function could be the same, worse, or different. Spinal cord stimulator placement: Risks of surgery include infection,  bleeding, nerve damage, death, stroke, paralysis, failure to heal, need for further surgery, ongoing or worse pain, need for further surgery, CSF leak,  loss of bowel or bladder control, migration of the lead, battary failure which would necessitate need for further surgery.

## 2014-07-13 NOTE — Discharge Instructions (Signed)
Ok to shower in 5 days ° °

## 2014-07-13 NOTE — Anesthesia Procedure Notes (Signed)
Procedure Name: Intubation Date/Time: 07/13/2014 7:37 AM Performed by: Edmonia CaprioAUSTON, Stephanie Hecht M Pre-anesthesia Checklist: Patient identified, Timeout performed, Emergency Drugs available, Suction available and Patient being monitored Patient Re-evaluated:Patient Re-evaluated prior to inductionOxygen Delivery Method: Circle system utilized Preoxygenation: Pre-oxygenation with 100% oxygen Intubation Type: IV induction Ventilation: Mask ventilation without difficulty Laryngoscope Size: Miller and 2 Grade View: Grade II Tube type: Oral Tube size: 7.0 mm Number of attempts: 1 Airway Equipment and Method: Stylet Placement Confirmation: ETT inserted through vocal cords under direct vision,  breath sounds checked- equal and bilateral and positive ETCO2 Secured at: 21 cm Tube secured with: Tape Dental Injury: Teeth and Oropharynx as per pre-operative assessment

## 2014-07-13 NOTE — Evaluation (Signed)
Physical Therapy Evaluation Patient Details Name: Stephanie Gonzalez MRN: [id number] DOB: February 26, 1941 Today's Date: 07/13/2014   History of Present Illness  Pt admitted for elective lumbar nerve stimulator for chronic back and leg pain  Clinical Impression  All education completed.  Pt verbalized understanding.  Though not at baseline function, she will be safe in a home-like environment with her husband's assist     Follow Up Recommendations No PT follow up    Equipment Recommendations   (shower seat)    Recommendations for Other Services       Precautions / Restrictions Precautions Precautions: Back      Mobility  Bed Mobility Overal bed mobility: Needs Assistance Bed Mobility: Sidelying to Sit;Rolling;Sit to Sidelying Rolling: Supervision Sidelying to sit: Supervision     Sit to sidelying: Supervision General bed mobility comments: reinforced safest bed mobility technique  Transfers Overall transfer level: Needs assistance   Transfers: Sit to/from Stand Sit to Stand: Supervision            Ambulation/Gait Ambulation/Gait assistance: Independent Ambulation Distance (Feet): 160 Feet   Gait Pattern/deviations: Step-through pattern     General Gait Details: steady, but guarded  Stairs Stairs: Yes Stairs assistance: Supervision Stair Management: One rail Right;Step to pattern;Alternating pattern;Forwards Number of Stairs: 12 General stair comments: safe with rail  Wheelchair Mobility    Modified Rankin (Stroke Patients Only)       Balance Overall balance assessment: No apparent balance deficits (not formally assessed)                                           Pertinent Vitals/Pain Pain Assessment: Faces Faces Pain Scale: Hurts little more Pain Location: back Pain Descriptors / Indicators: Burning;Aching Pain Intervention(s): Limited activity within patient's tolerance    Home Living Family/patient expects to be discharged  to:: Private residence Living Arrangements: Spouse/significant other Available Help at Discharge: Family Type of Home: House Home Access: Stairs to enter Entrance Stairs-Rails: Doctor, general practice of Steps: 2 Home Layout: Two level;Laundry or work area in basement        Prior Function Level of Independence: Independent               Higher education careers adviser        Extremity/Trunk Assessment               Lower Extremity Assessment: Overall WFL for tasks assessed      Cervical / Trunk Assessment: Normal  Communication   Communication: No difficulties  Cognition Arousal/Alertness: Awake/alert Behavior During Therapy: WFL for tasks assessed/performed Overall Cognitive Status: Within Functional Limits for tasks assessed                      General Comments General comments (skin integrity, edema, etc.): Educated in back care/suggested precautions, lifting restrictions and progression of activity    Exercises        Assessment/Plan    PT Assessment Patent does not need any further PT services  PT Diagnosis Acute pain   PT Problem List Pain  PT Treatment Interventions     PT Goals (Current goals can be found in the Care Plan section) Acute Rehab PT Goals PT Goal Formulation: All assessment and education complete, DC therapy    Frequency     Barriers to discharge        Co-evaluation  End of Session   Activity Tolerance: Patient tolerated treatment well Patient left: in bed;with call bell/phone within reach Nurse Communication: Mobility status    Functional Assessment Tool Used: clinical judgement Functional Limitation: Mobility: Walking and moving around Mobility: Walking and Moving Around Current Status ([id number](G8978): At least 1 percent but less than 20 percent impaired, limited or restricted Mobility: Walking and Moving Around Goal Status [phone](G8979): At least 1 percent but less than 20 percent impaired, limited or  restricted Mobility: Walking and Moving Around Discharge Status [phone](G8980): At least 1 percent but less than 20 percent impaired, limited or restricted    Time: 1700-1725 PT Time Calculation (min) (ACUTE ONLY): 25 min   Charges:   PT Evaluation $Initial PT Evaluation Tier I: 1 Procedure PT Treatments $Gait Training: 8-22 mins   PT G Codes:   PT G-Codes **NOT FOR INPATIENT CLASS** Functional Assessment Tool Used: clinical judgement Functional Limitation: Mobility: Walking and moving around Mobility: Walking and Moving Around Current Status ([phone](G8978): At least 1 percent but less than 20 percent impaired, limited or restricted Mobility: Walking and Moving Around Goal Status [phone](G8979): At least 1 percent but less than 20 percent impaired, limited or restricted Mobility: Walking and Moving Around Discharge Status [phone](G8980): At least 1 percent but less than 20 percent impaired, limited or restricted    Elisa Sorlie, Eliseo GumKenneth V 07/13/2014, 5:37 PM 07/13/2014  [place] BingKen Benjimin Hadden, PT [phone]  (pager)

## 2014-07-13 NOTE — Progress Notes (Signed)
Report given to Maresa Morash rn as caregiver 

## 2014-07-13 NOTE — Anesthesia Postprocedure Evaluation (Signed)
  Anesthesia Post-op Note  Patient: Stephanie Gonzalez  Procedure(s) Performed: Procedure(s): LUMBAR SPINAL CORD STIMULATOR INSERTION (N/A)  Patient Location: PACU  Anesthesia Type:General  Level of Consciousness: awake and alert   Airway and Oxygen Therapy: Patient Spontanous Breathing and Patient connected to nasal cannula oxygen  Post-op Pain: moderate  Post-op Assessment: Post-op Vital signs reviewed, Patient's Cardiovascular Status Stable, Respiratory Function Stable, Patent Airway and No signs of Nausea or vomiting  Post-op Vital Signs: Reviewed and stable  Last Vitals:  Filed Vitals:   07/13/14 0558  BP: 168/50  Pulse: 76  Temp: 36.1 C  Resp: 20    Complications: No apparent anesthesia complications

## 2014-07-13 NOTE — Brief Op Note (Signed)
07/13/2014  9:25 AM  PATIENT:  Alfonso RamusSue M Rainbow  74 y.o. female  PRE-OPERATIVE DIAGNOSIS:  chronic lumbar back pain and right leg pain  POST-OPERATIVE DIAGNOSIS:  chronic lumbar back pain and right leg pain  PROCEDURE:  Procedure(s): LUMBAR SPINAL CORD STIMULATOR INSERTION (N/A)  SURGEON:  Surgeon(s) and Role:    * Venita Lickahari Djuna Frechette, MD - Primary  PHYSICIAN ASSISTANT:   ASSISTANTS: none   ANESTHESIA:   general  EBL:     BLOOD ADMINISTERED:none  DRAINS: none   LOCAL MEDICATIONS USED:  MARCAINE     SPECIMEN:  No Specimen  DISPOSITION OF SPECIMEN:  N/A  COUNTS:  YES  TOURNIQUET:  * No tourniquets in log *  DICTATION: .Other Dictation: Dictation Number [id number]  PLAN OF CARE: Admit for overnight observation  PATIENT DISPOSITION:  PACU - hemodynamically stable.

## 2014-07-13 NOTE — Transfer of Care (Signed)
Immediate Anesthesia Transfer of Care Note  Patient: Stephanie RamusSue M Hoback  Procedure(s) Performed: Procedure(s): LUMBAR SPINAL CORD STIMULATOR INSERTION (N/A)  Patient Location: PACU  Anesthesia Type:General  Level of Consciousness: awake and alert   Airway & Oxygen Therapy: Patient Spontanous Breathing and Patient connected to nasal cannula oxygen  Post-op Assessment: Report given to RN, Post -op Vital signs reviewed and stable and Patient moving all extremities  Post vital signs: Reviewed and stable  Last Vitals:  Filed Vitals:   07/13/14 0558  BP: 168/50  Pulse: 76  Temp: 36.1 C  Resp: 20    Complications: No apparent anesthesia complications

## 2014-07-14 DIAGNOSIS — G894 Chronic pain syndrome: Secondary | ICD-10-CM | POA: Diagnosis not present

## 2014-07-14 LAB — GLUCOSE, CAPILLARY
Glucose-Capillary: 113 mg/dL — ABNORMAL HIGH (ref 70–99)
Glucose-Capillary: 105 mg/dL — ABNORMAL HIGH (ref 70–99)

## 2014-07-14 LAB — HEMOGLOBIN A1C
Hgb A1c MFr Bld: 5.5 % (ref 4.8–5.6)
Mean Plasma Glucose: 111 mg/dL

## 2014-07-14 NOTE — Progress Notes (Signed)
Pt and husband given D/C instructions with Rx's, verbal understanding was provided. Pt's incision is clean and dry with no sign of infection. Pt's IV was removed prior to D/C. Pt D/C'd home via wheelchair @ 1400 per MD order. Pt is stable @ D/C and has no other needs at this time. Rema FendtAshley Rhianna Raulerson, RN

## 2014-07-14 NOTE — Progress Notes (Signed)
    Subjective: Procedure(s) (LRB): LUMBAR SPINAL CORD STIMULATOR INSERTION (N/A) 1 Day Post-Op  Patient reports pain as 2 on 0-10 scale.  Reports decreased leg pain reports incisional back pain   Positive void Negative bowel movement Positive flatus Negative chest pain or shortness of breath  Objective: Vital signs in last 24 hours: Temp:  [97.7 F (36.5 C)-98.7 F (37.1 C)] 98.2 F (36.8 C) (02/05 1202) Pulse Rate:  [58-75] 69 (02/05 1202) Resp:  [16-18] 16 (02/05 1202) BP: (117-135)/(40-56) 128/40 mmHg (02/05 1202) SpO2:  [94 %-98 %] 97 % (02/05 1202)  Intake/Output from previous day: 02/04 0701 - 02/05 0700 In: 1360 [P.O.:360; I.V.:1000] Out: -   Labs: No results for input(s): WBC, RBC, HCT, PLT in the last 72 hours. No results for input(s): NA, K, CL, CO2, BUN, CREATININE, GLUCOSE, CALCIUM in the last 72 hours. No results for input(s): LABPT, INR in the last 72 hours.  Physical Exam: Neurologically intact ABD soft Intact pulses distally Incision: dressing C/D/I Compartment soft  Assessment/Plan: Patient stable  xrays n/a Continue mobilization with physical therapy Continue care  Advance diet Up with therapy  D/C to home  Venita Lickahari Skylar Flynt, MD [facility] [phone]

## 2014-07-14 NOTE — Evaluation (Signed)
Occupational Therapy Evaluation Patient Details Name: Stephanie Gonzalez MRN: [id number] DOB: 09-07-1940 Today's Date: 07/14/2014    History of Present Illness Pt admitted for elective lumbar nerve stimulator for chronic back and leg pain   Clinical Impression   PTA pt lived at home and was independent with ADLs. Pt currently at Supervision level for ADLs due to back precautions. All education and training completed for compensatory techniques for ADLs. No further acute OT needs.     Follow Up Recommendations  No OT follow up;Supervision/Assistance - 24 hour    Equipment Recommendations  None recommended by OT    Recommendations for Other Services       Precautions / Restrictions Precautions Precautions: Back Precaution Booklet Issued: Yes (comment) Precaution Comments: Educated pt on back precautions and incorporating into ADLs.  Restrictions Weight Bearing Restrictions: No      Mobility Bed Mobility Overal bed mobility: Needs Assistance Bed Mobility: Rolling;Sidelying to Sit Rolling: Supervision Sidelying to sit: Supervision       General bed mobility comments: Reviewed log roll technique and VC's for safety.  HOB flat and no bed rails  Transfers Overall transfer level: Needs assistance   Transfers: Sit to/from Stand Sit to Stand: Supervision         General transfer comment: Supervision for safety.     Balance Overall balance assessment: No apparent balance deficits (not formally assessed)                                          ADL Overall ADL's : Needs assistance/impaired                                       General ADL Comments: Pt overall at Supervision level for ADLs. Pt able to reach LEs through figure four method without difficulty. Educated pt on safety and compensatory techniques for ADLs.                Pertinent Vitals/Pain Pain Assessment: No/denies pain     Hand Dominance Right   Extremity/Trunk  Assessment Upper Extremity Assessment Upper Extremity Assessment: Overall WFL for tasks assessed   Lower Extremity Assessment Lower Extremity Assessment: Overall WFL for tasks assessed   Cervical / Trunk Assessment Cervical / Trunk Assessment: Normal   Communication Communication Communication: No difficulties   Cognition Arousal/Alertness: Awake/alert Behavior During Therapy: WFL for tasks assessed/performed Overall Cognitive Status: Within Functional Limits for tasks assessed                                Home Living Family/patient expects to be discharged to:: Private residence Living Arrangements: Spouse/significant other Available Help at Discharge: Family Type of Home: House Home Access: Stairs to enter Secretary/administrator of Steps: 2 Entrance Stairs-Rails: Right;Left Home Layout: Two level;Laundry or work area in Artist of Steps: 12 Alternate Level Stairs-Rails: Right;Left Bathroom Shower/Tub: Tub/shower unit;Walk-in shower Shower/tub characteristics: Engineer, building services: Handicapped height                Prior Functioning/Environment Level of Independence: Independent        Comments: works 2nd shift at [employer]    OT Diagnosis: Generalized weakness;Acute pain    End of Session  Activity Tolerance: Patient  tolerated treatment well Patient left: with call bell/phone within reach;Other (comment) (sitting EOB for breakfast)   Time: [id number] OT Time Calculation (min): 28 min Charges:  OT General Charges $OT Visit: 1 Procedure OT Evaluation $Initial OT Evaluation Tier I: 1 Procedure OT Treatments $Self Care/Home Management : 8-22 mins G-Codes: OT G-codes **NOT FOR INPATIENT CLASS** Functional Assessment Tool Used: clinical judgment Functional Limitation: Self care Self Care Current Status (O1308(G8987): At least 1 percent but less than 20 percent impaired, limited or restricted Self Care Goal Status  (M5784(G8988): At least 1 percent but less than 20 percent impaired, limited or restricted Self Care Discharge Status [phone](G8989): At least 1 percent but less than 20 percent impaired, limited or restricted  Rae LipsMiller, Abcde Oneil M 07/14/2014, 9:16 AM  Carney LivingLeeAnn Marie Emmanuelle Coxe, OTR/L Occupational Therapist [phone] (pager)

## 2014-07-14 NOTE — Progress Notes (Signed)
UR completed 

## 2014-07-17 ENCOUNTER — Encounter (HOSPITAL_COMMUNITY): Payer: Self-pay | Admitting: Orthopedic Surgery

## 2014-07-22 NOTE — Discharge Summary (Signed)
Patient ID: Stephanie Gonzalez MRN: [id number] DOB/AGE: [birth year] 74 y.o.  Admit date: 07/13/2014 Discharge date: 07/22/2014  Admission Diagnoses:  Active Problems:   Chronic pain   Discharge Diagnoses:  Active Problems:   Chronic pain  status post Procedure(s): LUMBAR SPINAL CORD STIMULATOR INSERTION  Past Medical History  Diagnosis Date  . ASTHMA 10/06/2007  . BURSITIS, RIGHT HIP 02/04/2010  . DIABETES MELLITUS, TYPE II 11/01/2009  . HYPERLIPIDEMIA 10/06/2007  . HYPERTENSION 10/06/2007  . Family history of adverse reaction to anesthesia     Mother had n/v  . Restless leg syndrome     Surgeries: Procedure(s): LUMBAR SPINAL CORD STIMULATOR INSERTION on 07/13/2014   Consultants:    Discharged Condition: Improved  Hospital Course: Stephanie Gonzalez is an 74 y.o. female who was admitted 07/13/2014 for operative treatment of chronic pain. Patient failed conservative treatments (please see the history and physical for the specifics) and had severe unremitting pain that affects sleep, daily activities and work/hobbies. After pre-op clearance, the patient was taken to the operating room on 07/13/2014 and underwent  Procedure(s): LUMBAR SPINAL CORD STIMULATOR INSERTION.    Patient was given perioperative antibiotics:  Anti-infectives    Start     Dose/Rate Route Frequency Ordered Stop   07/13/14 1600  ceFAZolin (ANCEF) IVPB 1 g/50 mL premix     1 g 100 mL/hr over 30 Minutes Intravenous Every 8 hours 07/13/14 1130 07/13/14 2353   07/12/14 1328  ceFAZolin (ANCEF) IVPB 2 g/50 mL premix     2 g 100 mL/hr over 30 Minutes Intravenous 30 min pre-op 07/12/14 1328 07/13/14 0755       Patient was given sequential compression devices and early ambulation to prevent DVT.   Patient benefited maximally from hospital stay and there were no complications. At the time of discharge, the patient was urinating/moving their bowels without difficulty, tolerating a regular diet, pain is controlled with oral  pain medications and they have been cleared by PT/OT.   Recent vital signs: No data found.    Recent laboratory studies: No results for input(s): WBC, HGB, HCT, PLT, NA, K, CL, CO2, BUN, CREATININE, GLUCOSE, INR, CALCIUM in the last 72 hours.  Invalid input(s): PT, 2   Discharge Medications:     Medication List    STOP taking these medications        HYDROcodone-acetaminophen 10-325 MG per tablet  Commonly known as:  NORCO     HYDROcodone-homatropine 5-1.5 MG/5ML syrup  Commonly known as:  HYCODAN     nabumetone 500 MG tablet  Commonly known as:  RELAFEN      TAKE these medications        amLODipine 5 MG tablet  Commonly known as:  NORVASC  Take 1 tablet (5 mg total) by mouth daily.     atorvastatin 40 MG tablet  Commonly known as:  LIPITOR  Take 1 tablet (40 mg total) by mouth daily.     b complex vitamins tablet  Take 1 tablet by mouth daily.     clonazePAM 1 MG tablet  Commonly known as:  KLONOPIN  TAKE 1 TABLET BY MOUTH AT BEDTIME     docusate sodium 100 MG capsule  Commonly known as:  COLACE  Take 1 capsule (100 mg total) by mouth 3 (three) times daily as needed for mild constipation.     freestyle lancets  USE TO CHECK BLOOD SUGAR DAILY AND PRN     glucose blood test strip  Commonly  known as:  FREESTYLE LITE  USE TO CHECK BLOOD SUGAR DAILY AND PRN     LYSINE PO  Take 1 tablet by mouth daily.     metFORMIN 500 MG tablet  Commonly known as:  GLUCOPHAGE  Take 500 mg by mouth daily with breakfast.     methocarbamol 500 MG tablet  Commonly known as:  ROBAXIN  Take 1 tablet (500 mg total) by mouth 3 (three) times daily as needed for muscle spasms.     ondansetron 4 MG tablet  Commonly known as:  ZOFRAN  Take 1 tablet (4 mg total) by mouth every 8 (eight) hours as needed for nausea or vomiting.     oxyCODONE-acetaminophen 10-325 MG per tablet  Commonly known as:  PERCOCET  Take 1 tablet by mouth every 4 (four) hours as needed for pain.      PROAIR HFA 108 (90 BASE) MCG/ACT inhaler  Generic drug:  albuterol  Inhale 2 puffs into the lungs every 6 (six) hours as needed.     sertraline 100 MG tablet  Commonly known as:  ZOLOFT  Take one-half tablet by  mouth daily     vitamin B-12 500 MCG tablet  Commonly known as:  CYANOCOBALAMIN  Take 500 mcg by mouth daily.        Diagnostic Studies: Dg Thoracic Spine 2 View  07/13/2014   CLINICAL DATA:  Spinal stimulator T8 through T10  EXAM: DG C-ARM 61-120 MIN; THORACIC SPINE - 2 VIEW  COMPARISON:  None.  FINDINGS: Spinal stimulator is identified in the lower thoracic spine.  IMPRESSION: Spinal stimulator in the lower thoracic spine   Electronically Signed   By: Sherian ReinWei-Chen  Lin M.D.   On: 07/13/2014 11:06   Dg C-arm 1-60 Min  07/13/2014   CLINICAL DATA:  Spinal stimulator T8 through T10  EXAM: DG C-ARM 61-120 MIN; THORACIC SPINE - 2 VIEW  COMPARISON:  None.  FINDINGS: Spinal stimulator is identified in the lower thoracic spine.  IMPRESSION: Spinal stimulator in the lower thoracic spine   Electronically Signed   By: Sherian ReinWei-Chen  Lin M.D.   On: 07/13/2014 11:06          Follow-up Information    Follow up with Alvy BealBROOKS,Talita Recht D, MD. Schedule an appointment as soon as possible for a visit in 1 week.   Specialty:  Orthopedic Surgery   Why:  For suture removal, For wound re-check   Contact information:   [address] [place] [address] [phone]       Discharge Plan:  discharge to home  Disposition: stimulator functioning well.  Ok for d/c to home    Signed: Venita LickBROOKS,Chaquita Basques D for Dr. Venita Lickahari Wylie Russon [facility] [phone] 07/22/2014, 8:36 AM

## 2014-09-04 ENCOUNTER — Ambulatory Visit (INDEPENDENT_AMBULATORY_CARE_PROVIDER_SITE_OTHER): Payer: Medicare Other | Admitting: Internal Medicine

## 2014-09-04 ENCOUNTER — Encounter: Payer: Self-pay | Admitting: Internal Medicine

## 2014-09-04 VITALS — BP 142/90 | HR 73 | Temp 98.0°F | Resp 20 | Ht 63.0 in | Wt 184.0 lb

## 2014-09-04 DIAGNOSIS — J069 Acute upper respiratory infection, unspecified: Secondary | ICD-10-CM

## 2014-09-04 DIAGNOSIS — B9789 Other viral agents as the cause of diseases classified elsewhere: Secondary | ICD-10-CM

## 2014-09-04 DIAGNOSIS — E119 Type 2 diabetes mellitus without complications: Secondary | ICD-10-CM | POA: Diagnosis not present

## 2014-09-04 DIAGNOSIS — I10 Essential (primary) hypertension: Secondary | ICD-10-CM | POA: Diagnosis not present

## 2014-09-04 MED ORDER — PREDNISONE 10 MG PO TABS: 10.0000 mg | ORAL_TABLET | Freq: Two times a day (BID) | ORAL | Status: DC

## 2014-09-04 MED ORDER — HYDROCODONE-HOMATROPINE 5-1.5 MG/5ML PO SYRP: 5.0000 mL | ORAL_SOLUTION | Freq: Four times a day (QID) | ORAL | Status: AC | PRN

## 2014-09-04 NOTE — Progress Notes (Signed)
Subjective:    Patient ID: Stephanie Gonzalez, female    DOB: Jul 15, 1940, 74 y.o.   MRN: [id number]  HPI 74 year old patient who has essential hypertension and well-controlled type 2 diabetes.  She presents with a three-day history of increasing chest congestion, cough, mild sore throat.  There is been no wheezing, shortness of breath.   Past Medical History  Diagnosis Date  . ASTHMA 10/06/2007  . BURSITIS, RIGHT HIP 02/04/2010  . DIABETES MELLITUS, TYPE II 11/01/2009  . HYPERLIPIDEMIA 10/06/2007  . HYPERTENSION 10/06/2007  . Family history of adverse reaction to anesthesia     Mother had n/v  . Restless leg syndrome     History   Social History  . Marital Status: Married    Spouse Name: N/A  . Number of Children: N/A  . Years of Education: N/A   Occupational History  . Not on file.   Social History Main Topics  . Smoking status: Never Smoker   . Smokeless tobacco: Never Used  . Alcohol Use: Yes     Comment: rarely  . Drug Use: No  . Sexual Activity: Not on file   Other Topics Concern  . Not on file   Social History Narrative    Past Surgical History  Procedure Laterality Date  . Appendectomy    . Cholecystectomy    . Abdominal hysterectomy    . Tubal ligation    . Tonsillectomy    . Knee arthroscopy Right   . Spinal cord stimulator insertion N/A 07/13/2014    Procedure: LUMBAR SPINAL CORD STIMULATOR INSERTION;  Surgeon: Venita Lickahari Brooks, MD;  Location: [facility] OR;  Service: Orthopedics;  Laterality: N/A;    No family history on file.  Allergies  Allergen Reactions  . Tetracycline     REACTION: rash, agitation    Current Outpatient Prescriptions on File Prior to Visit  Medication Sig Dispense Refill  . albuterol (PROAIR HFA) 108 (90 BASE) MCG/ACT inhaler Inhale 2 puffs into the lungs every 6 (six) hours as needed.      Marland Kitchen. amLODipine (NORVASC) 5 MG tablet Take 1 tablet (5 mg total) by mouth daily. 90 tablet 1  . atorvastatin (LIPITOR) 40 MG tablet Take 1 tablet (40  mg total) by mouth daily. 90 tablet 1  . b complex vitamins tablet Take 1 tablet by mouth daily.    . clonazePAM (KLONOPIN) 1 MG tablet TAKE 1 TABLET BY MOUTH AT BEDTIME 90 tablet 1  . glucose blood (FREESTYLE LITE) test strip USE TO CHECK BLOOD SUGAR DAILY AND PRN 100 each 12  . Lancets (FREESTYLE) lancets USE TO CHECK BLOOD SUGAR DAILY AND PRN 100 each 12  . LYSINE PO Take 1 tablet by mouth daily.    . metFORMIN (GLUCOPHAGE) 500 MG tablet Take 500 mg by mouth daily with breakfast.    . sertraline (ZOLOFT) 100 MG tablet Take one-half tablet by  mouth daily 45 tablet 2  . vitamin B-12 (CYANOCOBALAMIN) 500 MCG tablet Take 500 mcg by mouth daily.    . methocarbamol (ROBAXIN) 500 MG tablet Take 1 tablet (500 mg total) by mouth 3 (three) times daily as needed for muscle spasms. (Patient not taking: Reported on 09/04/2014) 60 tablet 0   No current facility-administered medications on file prior to visit.    BP 142/90 mmHg  Pulse 73  Temp(Src) 98 F (36.7 C) (Oral)  Resp 20  Ht 5\' 3"  (1.6 m)  Wt 184 lb (83.462 kg)  BMI 32.60 kg/m2  SpO2 98%  Review of Systems  Constitutional: Positive for activity change, appetite change and fatigue.  HENT: Positive for congestion. Negative for dental problem, hearing loss, rhinorrhea, sinus pressure, sore throat and tinnitus.   Eyes: Negative for pain, discharge and visual disturbance.  Respiratory: Positive for cough. Negative for shortness of breath.   Cardiovascular: Negative for chest pain, palpitations and leg swelling.  Gastrointestinal: Negative for nausea, vomiting, abdominal pain, diarrhea, constipation, blood in stool and abdominal distention.  Genitourinary: Negative for dysuria, urgency, frequency, hematuria, flank pain, vaginal bleeding, vaginal discharge, difficulty urinating, vaginal pain and pelvic pain.  Musculoskeletal: Negative for joint swelling, arthralgias and gait problem.  Skin: Negative for rash.  Neurological: Positive for  weakness. Negative for dizziness, syncope, speech difficulty, numbness and headaches.  Hematological: Negative for adenopathy.  Psychiatric/Behavioral: Negative for behavioral problems, dysphoric mood and agitation. The patient is not nervous/anxious.        Objective:   Physical Exam  Constitutional: She is oriented to person, place, and time. She appears well-developed and well-nourished.  HENT:  Head: Normocephalic.  Right Ear: External ear normal.  Left Ear: External ear normal.  Oral pharynx slightly erythematous  Eyes: Conjunctivae and EOM are normal. Pupils are equal, round, and reactive to light.  Neck: Normal range of motion. Neck supple. No thyromegaly present.  Cardiovascular: Normal rate, regular rhythm, normal heart sounds and intact distal pulses.   Pulmonary/Chest: Effort normal and breath sounds normal.  Abdominal: Soft. Bowel sounds are normal. She exhibits no mass. There is no tenderness.  Musculoskeletal: Normal range of motion.  Lymphadenopathy:    She has no cervical adenopathy.  Neurological: She is alert and oriented to person, place, and time.  Skin: Skin is warm and dry. No rash noted.  Psychiatric: She has a normal mood and affect. Her behavior is normal.          Assessment & Plan:   Viral URI with cough Allergic rhinitis/history of asthma  Will treat symptomatically  Hypertension, stable Type 2 diabetes, excellent control on oral agents  Recheck 3 months

## 2014-09-04 NOTE — Patient Instructions (Signed)
Acute bronchitis symptoms for less than 10 days are generally not helped by antibiotics.  Take over-the-counter expectorants and cough medications such as  Mucinex DM.  Call if there is no improvement in 5 to 7 days or if  you develop worsening cough, fever, or new symptoms, such as shortness of breath or chest pain.  Acute bronchitis usually goes away in a couple weeks. Oftentimes, no medical treatment is necessary. Medicines are sometimes given for relief of fever or cough. Antibiotic medicines are usually not needed but may be prescribed in certain situations. In some cases, an inhaler may be recommended to help reduce shortness of breath and control the cough. A cool mist vaporizer may also be used to help thin bronchial secretions and make it easier to clear the chest.   HOME CARE INSTRUCTIONS  Get plenty of rest.  Drink enough fluids to keep your urine clear or pale yellow (unless you have a medical condition that requires fluid restriction). Increasing fluids may help thin your respiratory secretions (sputum) and reduce chest congestion, and it will prevent dehydration.  Take medicines only as directed by your health care provider.   Avoid smoking and secondhand smoke. Exposure to cigarette smoke or irritating chemicals will make bronchitis worse. If you are a smoker, consider using nicotine gum or skin patches to help control withdrawal symptoms. Quitting smoking will help your lungs heal faster.  Reduce the chances of another bout of acute bronchitis by washing your hands frequently, avoiding people with cold symptoms, and trying not to touch your hands to your mouth, nose, or eyes.    

## 2014-09-04 NOTE — Progress Notes (Signed)
Pre visit review using our clinic review tool, if applicable. No additional management support is needed unless otherwise documented below in the visit note. 

## 2014-10-10 ENCOUNTER — Other Ambulatory Visit: Payer: Self-pay | Admitting: Internal Medicine

## 2014-10-24 ENCOUNTER — Other Ambulatory Visit: Payer: Self-pay | Admitting: *Deleted

## 2014-10-24 MED ORDER — AMLODIPINE BESYLATE 5 MG PO TABS: 5.0000 mg | ORAL_TABLET | Freq: Every day | ORAL | Status: DC

## 2014-10-24 MED ORDER — SERTRALINE HCL 100 MG PO TABS: ORAL_TABLET | ORAL | Status: DC

## 2014-11-22 ENCOUNTER — Encounter: Payer: Self-pay | Admitting: Internal Medicine

## 2014-11-22 ENCOUNTER — Ambulatory Visit (INDEPENDENT_AMBULATORY_CARE_PROVIDER_SITE_OTHER): Payer: Medicare Other | Admitting: Internal Medicine

## 2014-11-22 VITALS — BP 120/70 | HR 87 | Temp 98.4°F | Resp 20 | Ht 63.0 in | Wt 181.0 lb

## 2014-11-22 DIAGNOSIS — J4520 Mild intermittent asthma, uncomplicated: Secondary | ICD-10-CM | POA: Diagnosis not present

## 2014-11-22 DIAGNOSIS — I10 Essential (primary) hypertension: Secondary | ICD-10-CM

## 2014-11-22 DIAGNOSIS — E119 Type 2 diabetes mellitus without complications: Secondary | ICD-10-CM

## 2014-11-22 DIAGNOSIS — E785 Hyperlipidemia, unspecified: Secondary | ICD-10-CM | POA: Diagnosis not present

## 2014-11-22 MED ORDER — ACYCLOVIR 5 % EX CREA: 1.0000 "application " | TOPICAL_CREAM | CUTANEOUS | Status: DC

## 2014-11-22 MED ORDER — PROMETHAZINE HCL 12.5 MG PO TABS: 12.5000 mg | ORAL_TABLET | Freq: Four times a day (QID) | ORAL | Status: DC | PRN

## 2014-11-22 NOTE — Progress Notes (Signed)
Pre visit review using our clinic review tool, if applicable. No additional management support is needed unless otherwise documented below in the visit note. 

## 2014-11-22 NOTE — Progress Notes (Signed)
Subjective:    Patient ID: Stephanie Gonzalez, female    DOB: July 23, 1940, 74 y.o.   MRN: [id number]  HPI 74 year old patient who presents with a three-day history of headache, generalized myalgias, low-grade fever, general fatigue and nausea without vomiting.  She also has developed a cold sore involving her right lower lip that has been quite aggravating.  No diarrhea.  She does have chronic back pain.  It has been very stable.  She has type 2 diabetes which has been very well controlled.  She does have history of asthma but no wheezing or cough.  She has treated hypertension and dyslipidemia Patient has done a search for a tick.  No outdoor or unusual activities for tick exposure  Past Medical History  Diagnosis Date  . ASTHMA 10/06/2007  . BURSITIS, RIGHT HIP 02/04/2010  . DIABETES MELLITUS, TYPE II 11/01/2009  . HYPERLIPIDEMIA 10/06/2007  . HYPERTENSION 10/06/2007  . Family history of adverse reaction to anesthesia     Mother had n/v  . Restless leg syndrome     History   Social History  . Marital Status: Married    Spouse Name: N/A  . Number of Children: N/A  . Years of Education: N/A   Occupational History  . Not on file.   Social History Main Topics  . Smoking status: Never Smoker   . Smokeless tobacco: Never Used  . Alcohol Use: Yes     Comment: rarely  . Drug Use: No  . Sexual Activity: Not on file   Other Topics Concern  . Not on file   Social History Narrative    Past Surgical History  Procedure Laterality Date  . Appendectomy    . Cholecystectomy    . Abdominal hysterectomy    . Tubal ligation    . Tonsillectomy    . Knee arthroscopy Right   . Spinal cord stimulator insertion N/A 07/13/2014    Procedure: LUMBAR SPINAL CORD STIMULATOR INSERTION;  Surgeon: Venita Lick, MD;  Location: [facility] OR;  Service: Orthopedics;  Laterality: N/A;    No family history on file.  Allergies  Allergen Reactions  . Tetracycline     REACTION: rash, agitation    Current  Outpatient Prescriptions on File Prior to Visit  Medication Sig Dispense Refill  . albuterol (PROAIR HFA) 108 (90 BASE) MCG/ACT inhaler Inhale 2 puffs into the lungs every 6 (six) hours as needed.      Marland Kitchen amLODipine (NORVASC) 5 MG tablet Take 1 tablet (5 mg total) by mouth daily. 90 tablet 2  . atorvastatin (LIPITOR) 40 MG tablet Take 1 tablet (40 mg total) by mouth daily. 90 tablet 1  . b complex vitamins tablet Take 1 tablet by mouth daily.    . clonazePAM (KLONOPIN) 1 MG tablet TAKE 1 TABLET BY MOUTH AT BEDTIME AS NEEDED 90 tablet 1  . glucose blood (FREESTYLE LITE) test strip USE TO CHECK BLOOD SUGAR DAILY AND PRN 100 each 12  . HYDROcodone-acetaminophen (NORCO) 10-325 MG per tablet Take 1 tablet by mouth 2 (two) times daily as needed.  0  . Lancets (FREESTYLE) lancets USE TO CHECK BLOOD SUGAR DAILY AND PRN 100 each 12  . LYSINE PO Take 1 tablet by mouth daily.    . metFORMIN (GLUCOPHAGE) 500 MG tablet Take 500 mg by mouth daily with breakfast.    . methocarbamol (ROBAXIN) 500 MG tablet Take 1 tablet (500 mg total) by mouth 3 (three) times daily as needed for muscle spasms. 60 tablet  0  . predniSONE (DELTASONE) 10 MG tablet Take 1 tablet (10 mg total) by mouth 2 (two) times daily with a meal. 14 tablet 0  . sertraline (ZOLOFT) 100 MG tablet Take one-half tablet by  mouth daily 45 tablet 2  . vitamin B-12 (CYANOCOBALAMIN) 500 MCG tablet Take 500 mcg by mouth daily.     No current facility-administered medications on file prior to visit.    BP 120/70 mmHg  Pulse 87  Temp(Src) 98.4 F (36.9 C) (Oral)  Resp 20  Ht 5\' 3"  (1.6 m)  Wt 181 lb (82.101 kg)  BMI 32.07 kg/m2  SpO2 97%      Review of Systems  Constitutional: Positive for fever, chills, activity change, appetite change and fatigue.  HENT: Negative for congestion, dental problem, hearing loss, rhinorrhea, sinus pressure, sore throat and tinnitus.   Eyes: Negative for pain, discharge and visual disturbance.  Respiratory:  Negative for cough and shortness of breath.   Cardiovascular: Negative for chest pain, palpitations and leg swelling.  Gastrointestinal: Negative for nausea, vomiting, abdominal pain, diarrhea, constipation, blood in stool and abdominal distention.  Genitourinary: Negative for dysuria, urgency, frequency, hematuria, flank pain, vaginal bleeding, vaginal discharge, difficulty urinating, vaginal pain and pelvic pain.  Musculoskeletal: Negative for joint swelling, arthralgias and gait problem.  Skin: Positive for wound. Negative for rash.  Neurological: Positive for weakness. Negative for dizziness, syncope, speech difficulty, numbness and headaches.  Hematological: Negative for adenopathy.  Psychiatric/Behavioral: Negative for behavioral problems, dysphoric mood and agitation. The patient is not nervous/anxious.        Objective:   Physical Exam  Constitutional: She is oriented to person, place, and time. She appears well-developed and well-nourished.  HENT:  Head: Normocephalic.  Right Ear: External ear normal.  Left Ear: External ear normal.  Mouth/Throat: Oropharynx is clear and moist.  Eyes: Conjunctivae and EOM are normal. Pupils are equal, round, and reactive to light.  Neck: Normal range of motion. Neck supple. No thyromegaly present.  Cardiovascular: Normal rate, regular rhythm, normal heart sounds and intact distal pulses.   Pulmonary/Chest: Effort normal and breath sounds normal.  Abdominal: Soft. Bowel sounds are normal. She exhibits no mass. There is no tenderness.  Musculoskeletal: Normal range of motion.  Lymphadenopathy:    She has no cervical adenopathy.  Neurological: She is alert and oriented to person, place, and time.  Skin: Skin is warm and dry. No rash noted.  1 cm superficial ulcer right lower lip  Psychiatric: She has a normal mood and affect. Her behavior is normal.          Assessment & Plan:   Viral syndrome.  The patient does have Vicodin for  pain. Fever blister.  Will give a prescription for acyclovir Hypertension, well-controlled Diabetes mellitus.  Well-controlled  CPX as scheduled Note for work written

## 2014-11-22 NOTE — Patient Instructions (Signed)
Acute bronchitis symptoms for less than 10 days are generally not helped by antibiotics.  Take over-the-counter expectorants and cough medications such as  Mucinex DM.  Call if there is no improvement in 5 to 7 days or if  you develop worsening cough, fever, or new symptoms, such as shortness of breath or chest pain.   

## 2015-03-02 ENCOUNTER — Ambulatory Visit: Payer: Medicare Other | Admitting: *Deleted

## 2015-03-09 ENCOUNTER — Encounter: Payer: Self-pay | Admitting: Adult Health

## 2015-03-09 ENCOUNTER — Ambulatory Visit (INDEPENDENT_AMBULATORY_CARE_PROVIDER_SITE_OTHER): Payer: Medicare Other | Admitting: Adult Health

## 2015-03-09 VITALS — BP 142/90 | HR 79 | Temp 97.8°F | Ht 63.0 in | Wt 177.3 lb

## 2015-03-09 DIAGNOSIS — Z23 Encounter for immunization: Secondary | ICD-10-CM | POA: Diagnosis not present

## 2015-03-09 DIAGNOSIS — J3489 Other specified disorders of nose and nasal sinuses: Secondary | ICD-10-CM | POA: Diagnosis not present

## 2015-03-09 MED ORDER — MAGIC MOUTHWASH W/LIDOCAINE
5.0000 mL | Freq: Three times a day (TID) | ORAL | Status: DC | PRN
Start: 2015-03-09 — End: 2015-10-23

## 2015-03-09 NOTE — Progress Notes (Signed)
Pre visit review using our clinic review tool, if applicable. No additional management support is needed unless otherwise documented below in the visit note. 

## 2015-03-09 NOTE — Progress Notes (Signed)
Subjective:    Patient ID: Stephanie Gonzalez, female    DOB: 10-26-1940, 74 y.o.   MRN: [id number]  HPI 74 year old female who presents to the office today for nasal congestion that started less than 24 hours ago. This morning she woke up with a sore throat and runny nose and continued nasal congestion with PND. Endorses getting this every spring and fall.    Review of Systems  Constitutional: Negative.   HENT: Positive for congestion, postnasal drip, rhinorrhea, sinus pressure and sore throat. Negative for ear discharge, ear pain and trouble swallowing.   Respiratory: Negative.   Cardiovascular: Negative.   Neurological: Negative.   All other systems reviewed and are negative.  Past Medical History  Diagnosis Date  . ASTHMA 10/06/2007  . BURSITIS, RIGHT HIP 02/04/2010  . DIABETES MELLITUS, TYPE II 11/01/2009  . HYPERLIPIDEMIA 10/06/2007  . HYPERTENSION 10/06/2007  . Family history of adverse reaction to anesthesia     Mother had n/v  . Restless leg syndrome     Social History   Social History  . Marital Status: Married    Spouse Name: N/A  . Number of Children: N/A  . Years of Education: N/A   Occupational History  . Not on file.   Social History Main Topics  . Smoking status: Never Smoker   . Smokeless tobacco: Never Used  . Alcohol Use: Yes     Comment: rarely  . Drug Use: No  . Sexual Activity: Not on file   Other Topics Concern  . Not on file   Social History Narrative    Past Surgical History  Procedure Laterality Date  . Appendectomy    . Cholecystectomy    . Abdominal hysterectomy    . Tubal ligation    . Tonsillectomy    . Knee arthroscopy Right   . Spinal cord stimulator insertion N/A 07/13/2014    Procedure: LUMBAR SPINAL CORD STIMULATOR INSERTION;  Surgeon: Venita Lick, MD;  Location: [facility] OR;  Service: Orthopedics;  Laterality: N/A;    No family history on file.  Allergies  Allergen Reactions  . Tetracycline     REACTION: rash, agitation     Current Outpatient Prescriptions on File Prior to Visit  Medication Sig Dispense Refill  . acyclovir cream (ZOVIRAX) 5 % Apply 1 application topically every 3 (three) hours. 15 g 0  . albuterol (PROAIR HFA) 108 (90 BASE) MCG/ACT inhaler Inhale 2 puffs into the lungs every 6 (six) hours as needed.      Marland Kitchen amLODipine (NORVASC) 5 MG tablet Take 1 tablet (5 mg total) by mouth daily. 90 tablet 2  . atorvastatin (LIPITOR) 40 MG tablet Take 1 tablet (40 mg total) by mouth daily. 90 tablet 1  . b complex vitamins tablet Take 1 tablet by mouth daily.    . clonazePAM (KLONOPIN) 1 MG tablet TAKE 1 TABLET BY MOUTH AT BEDTIME AS NEEDED 90 tablet 1  . glucose blood (FREESTYLE LITE) test strip USE TO CHECK BLOOD SUGAR DAILY AND PRN 100 each 12  . HYDROcodone-acetaminophen (NORCO) 10-325 MG per tablet Take 1 tablet by mouth 2 (two) times daily as needed.  0  . Lancets (FREESTYLE) lancets USE TO CHECK BLOOD SUGAR DAILY AND PRN 100 each 12  . LYSINE PO Take 1 tablet by mouth daily.    . metFORMIN (GLUCOPHAGE) 500 MG tablet Take 500 mg by mouth daily with breakfast.    . methocarbamol (ROBAXIN) 500 MG tablet Take 1 tablet (500  mg total) by mouth 3 (three) times daily as needed for muscle spasms. 60 tablet 0  . predniSONE (DELTASONE) 10 MG tablet Take 1 tablet (10 mg total) by mouth 2 (two) times daily with a meal. 14 tablet 0  . promethazine (PHENERGAN) 12.5 MG tablet Take 1 tablet (12.5 mg total) by mouth every 6 (six) hours as needed for nausea or vomiting. 30 tablet 0  . sertraline (ZOLOFT) 100 MG tablet Take one-half tablet by  mouth daily 45 tablet 2  . vitamin B-12 (CYANOCOBALAMIN) 500 MCG tablet Take 500 mcg by mouth daily.     No current facility-administered medications on file prior to visit.    BP 142/90 mmHg  Pulse 79  Temp(Src) 97.8 F (36.6 C) (Oral)  Ht  (1.6 m)  Wt 177 lb 4.8 oz (80.423 kg)  BMI 31.42 kg/m2  SpO2 96%       Objective:   Physical Exam  Constitutional: She is  oriented to person, place, and time. She appears well-developed and well-nourished. No distress.  HENT:  Head: Normocephalic and atraumatic.  Right Ear: External ear normal.  Left Ear: External ear normal.  Nose: Nose normal.  Mouth/Throat: No oropharyngeal exudate.  + PND  Eyes: Conjunctivae and EOM are normal. Pupils are equal, round, and reactive to light. Right eye exhibits no discharge. Left eye exhibits no discharge.  Neck: No thyromegaly present.  Cardiovascular: Normal rate, regular rhythm, normal heart sounds and intact distal pulses.  Exam reveals no gallop and no friction rub.   No murmur heard. Pulmonary/Chest: Effort normal and breath sounds normal. No respiratory distress. She has no wheezes. She has no rales. She exhibits no tenderness.  Lymphadenopathy:    She has no cervical adenopathy.  Neurological: She is alert and oriented to person, place, and time.  Skin: Skin is warm and dry. No rash noted. She is not diaphoretic. No erythema. No pallor.  Psychiatric: She has a normal mood and affect. Her behavior is normal. Judgment and thought content normal.  Nursing note and vitals reviewed.      Assessment & Plan:  1. Rhinorrhea - Likely from seasonal allergies - Use Claritin and Flonase as directed.  - magic mouthwash w/lidocaine SOLN; Take 5 mLs by mouth 3 (three) times daily as needed for mouth pain.  Dispense: 50 mL; Refill: 0 - follow up if no improvement in the next 2-3 days.   2. Need for prophylactic vaccination and inoculation against influenza - Flu Vaccine QUAD 36+ mos PF IM (Fluarix & Fluzone Quad PF)

## 2015-03-09 NOTE — Patient Instructions (Addendum)
It was great meeting you today!  Please pick up Flonase and Claritin at the drug store. Use these as directed.   Use the magic mouth wash, three times a day as needed. Gargle for 15 seconds and spit it out.   You can also use warm salt water gargles and/or honey.   Follow up if no improvement in 2-3 days or sooner if needed

## 2015-03-12 ENCOUNTER — Other Ambulatory Visit: Payer: Self-pay | Admitting: Adult Health

## 2015-03-12 ENCOUNTER — Telehealth: Payer: Self-pay | Admitting: Internal Medicine

## 2015-03-12 ENCOUNTER — Encounter: Payer: Self-pay | Admitting: Adult Health

## 2015-03-12 MED ORDER — HYDROCODONE-HOMATROPINE 5-1.5 MG/5ML PO SYRP: 5.0000 mL | ORAL_SOLUTION | Freq: Four times a day (QID) | ORAL | Status: DC | PRN

## 2015-03-12 NOTE — Telephone Encounter (Signed)
pls advise

## 2015-03-12 NOTE — Telephone Encounter (Signed)
Called and spoke with pt and pt is aware that prescription is ready for pick up and work note will be enclosed.

## 2015-03-12 NOTE — Telephone Encounter (Signed)
Stephanie Gonzalez, please see message and see if Kandee Keen will do work excuse.

## 2015-03-12 NOTE — Telephone Encounter (Signed)
That is fine 

## 2015-03-12 NOTE — Telephone Encounter (Signed)
Pt saw cory on 9-30 and would like new rx hydrocodone cough syrup. Pt also needs work note from 03-09-15 thru 03-12-15 and return to work on 03-13-15. Pt has been taking flonase and claritin

## 2015-03-16 ENCOUNTER — Telehealth: Payer: Self-pay | Admitting: Internal Medicine

## 2015-03-16 MED ORDER — HYDROCODONE-HOMATROPINE 5-1.5 MG/5ML PO SYRP: 5.0000 mL | ORAL_SOLUTION | Freq: Four times a day (QID) | ORAL | Status: DC | PRN

## 2015-03-16 NOTE — Telephone Encounter (Signed)
6 oz 

## 2015-03-16 NOTE — Telephone Encounter (Signed)
Rx last printed on 10.3.2016. Pls advise.

## 2015-03-16 NOTE — Telephone Encounter (Signed)
Pt request refill of the following: HYDROcodone-homatropine (HYCODAN) 5-1.5 MG/5ML syrup   Pt said she is still coughing and is asking if the above med can be refill   Phamacy:

## 2015-03-16 NOTE — Telephone Encounter (Signed)
Called and spoke with pt and pt is aware. Pt states she lives in [place].  Advised pt that office is closed on 10.8.2016 but we will reopen on Monday morning.  Pt states she has a little cough syurp left and she will see if it will last until Monday when she can pick up rx.

## 2015-03-19 ENCOUNTER — Telehealth: Payer: Self-pay | Admitting: Internal Medicine

## 2015-03-19 NOTE — Telephone Encounter (Signed)
Dr. Kirtland Bouchard refilled hycodan cough syrup but pls advise if work note can be written.

## 2015-03-19 NOTE — Telephone Encounter (Signed)
Note printed and given to patient.  

## 2015-03-19 NOTE — Telephone Encounter (Signed)
Pt came in on 09/30 to see Kandee Keen, Note was given to return to work on 10/03 and no RX given. Pt called in on Monday to get RX for cough syrup and is coming back in today for refill, pt has not been back to work. Pt is asking for a note stating to return to work today 03/18/16 as she takes phone calls 8 hours per day and has not had a voice to talk.

## 2015-03-19 NOTE — Telephone Encounter (Signed)
That is fine 

## 2015-03-20 DIAGNOSIS — Z0279 Encounter for issue of other medical certificate: Secondary | ICD-10-CM

## 2015-04-06 ENCOUNTER — Telehealth: Payer: Self-pay | Admitting: Internal Medicine

## 2015-04-06 MED ORDER — VALACYCLOVIR HCL 500 MG PO TABS: 500.0000 mg | ORAL_TABLET | Freq: Every day | ORAL | Status: DC

## 2015-04-06 NOTE — Telephone Encounter (Signed)
Generic Valtrex 500, #7 one daily

## 2015-04-06 NOTE — Telephone Encounter (Signed)
Please see message and advise 

## 2015-04-06 NOTE — Telephone Encounter (Signed)
Spoke to pt told her Rx sent to pharmacy for Valtrex 500 mg one tablet daily x 7 days. Pt verbalized understanding.

## 2015-04-06 NOTE — Telephone Encounter (Signed)
Pt states she has ulcers all in her mouth.   Pt went to [place] and drank all kinds of juices.  Pt was also seen by Kandee Keenory when dr Kirtland BouchardK not available and was given hydrocodone cough med.  Pt states between these two things she has developed these ulcers in mouth.  Wants to know if Dr Kirtland BouchardK will prescribe Valtrex for you.  [facility] [address]

## 2015-04-10 ENCOUNTER — Other Ambulatory Visit: Payer: Self-pay | Admitting: Internal Medicine

## 2015-04-10 ENCOUNTER — Telehealth: Payer: Self-pay | Admitting: Internal Medicine

## 2015-04-10 NOTE — Telephone Encounter (Signed)
Rx called in to pharmacy. 

## 2015-04-10 NOTE — Telephone Encounter (Signed)
ok 

## 2015-04-10 NOTE — Telephone Encounter (Signed)
Pt needs refill on clonazepam [facility] [place]

## 2015-04-29 ENCOUNTER — Other Ambulatory Visit: Payer: Self-pay | Admitting: Internal Medicine

## 2015-05-15 ENCOUNTER — Telehealth: Payer: Self-pay | Admitting: Internal Medicine

## 2015-05-15 MED ORDER — HYDROCODONE-HOMATROPINE 5-1.5 MG/5ML PO SYRP: 5.0000 mL | ORAL_SOLUTION | Freq: Four times a day (QID) | ORAL | Status: DC | PRN

## 2015-05-15 NOTE — Telephone Encounter (Signed)
Pt notified Rx ready for pickup. Rx printed and signed.  

## 2015-05-15 NOTE — Telephone Encounter (Signed)
Please see message and advise 

## 2015-05-15 NOTE — Telephone Encounter (Signed)
Okay 6 ounces no refill 

## 2015-05-15 NOTE — Telephone Encounter (Signed)
Pt has a cough and would like new rx hydrocodone cough syrup

## 2015-07-06 ENCOUNTER — Other Ambulatory Visit: Payer: Self-pay | Admitting: Internal Medicine

## 2015-09-11 ENCOUNTER — Encounter: Payer: Self-pay | Admitting: Family Medicine

## 2015-09-11 ENCOUNTER — Emergency Department (HOSPITAL_COMMUNITY): Payer: Medicare Other

## 2015-09-11 ENCOUNTER — Ambulatory Visit (INDEPENDENT_AMBULATORY_CARE_PROVIDER_SITE_OTHER): Payer: Medicare Other | Admitting: Family Medicine

## 2015-09-11 ENCOUNTER — Emergency Department (HOSPITAL_COMMUNITY)
Admission: EM | Admit: 2015-09-11 | Discharge: 2015-09-11 | Disposition: A | Discharge status: Home / Self Care | Payer: Medicare Other | Attending: Emergency Medicine | Admitting: Emergency Medicine

## 2015-09-11 VITALS — BP 198/112 | HR 112 | Temp 98.2°F

## 2015-09-11 DIAGNOSIS — J45909 Unspecified asthma, uncomplicated: Secondary | ICD-10-CM | POA: Diagnosis not present

## 2015-09-11 DIAGNOSIS — R51 Headache: Secondary | ICD-10-CM | POA: Insufficient documentation

## 2015-09-11 DIAGNOSIS — I10 Essential (primary) hypertension: Secondary | ICD-10-CM | POA: Insufficient documentation

## 2015-09-11 DIAGNOSIS — E785 Hyperlipidemia, unspecified: Secondary | ICD-10-CM | POA: Insufficient documentation

## 2015-09-11 DIAGNOSIS — E119 Type 2 diabetes mellitus without complications: Secondary | ICD-10-CM

## 2015-09-11 DIAGNOSIS — R42 Dizziness and giddiness: Secondary | ICD-10-CM | POA: Insufficient documentation

## 2015-09-11 DIAGNOSIS — Z8739 Personal history of other diseases of the musculoskeletal system and connective tissue: Secondary | ICD-10-CM | POA: Diagnosis not present

## 2015-09-11 DIAGNOSIS — R079 Chest pain, unspecified: Secondary | ICD-10-CM | POA: Insufficient documentation

## 2015-09-11 DIAGNOSIS — Z7952 Long term (current) use of systemic steroids: Secondary | ICD-10-CM | POA: Diagnosis not present

## 2015-09-11 DIAGNOSIS — Z79899 Other long term (current) drug therapy: Secondary | ICD-10-CM | POA: Diagnosis not present

## 2015-09-11 DIAGNOSIS — Z7984 Long term (current) use of oral hypoglycemic drugs: Secondary | ICD-10-CM | POA: Insufficient documentation

## 2015-09-11 DIAGNOSIS — G2581 Restless legs syndrome: Secondary | ICD-10-CM | POA: Insufficient documentation

## 2015-09-11 LAB — BASIC METABOLIC PANEL
Anion gap: 9 (ref 5–15)
BUN: 12 mg/dL (ref 6–20)
CO2: 25 mmol/L (ref 22–32)
Calcium: 9.4 mg/dL (ref 8.9–10.3)
Chloride: 106 mmol/L (ref 101–111)
Creatinine, Ser: 0.63 mg/dL (ref 0.44–1.00)
GFR calc Af Amer: >60 mL/min (ref >60)
GFR calc non Af Amer: >60 mL/min (ref >60)
Glucose, Bld: 109 mg/dL — ABNORMAL HIGH (ref 65–99)
Potassium: 3.6 mmol/L (ref 3.5–5.1)
Sodium: 140 mmol/L (ref 135–145)

## 2015-09-11 LAB — I-STAT TROPONIN, ED: Troponin i, poc: 0 ng/mL (ref 0.00–0.08)

## 2015-09-11 LAB — CBC
HCT: 38.6 % (ref 36.0–46.0)
Hemoglobin: 12.9 g/dL (ref 12.0–15.0)
MCH: 27.4 pg (ref 26.0–34.0)
MCHC: 33.4 g/dL (ref 30.0–36.0)
MCV: 82.1 fL (ref 78.0–100.0)
Platelets: 192 10*3/uL (ref 150–400)
RBC: 4.7 MIL/uL (ref 3.87–5.11)
RDW: 13 % (ref 11.5–15.5)
WBC: 8.5 10*3/uL (ref 4.0–10.5)

## 2015-09-11 MED ORDER — MECLIZINE HCL 25 MG PO TABS
25.0000 mg | ORAL_TABLET | Freq: Once | ORAL | Status: AC
  Administered 2015-09-11: 25 mg via ORAL
  Filled 2015-09-11: qty 1

## 2015-09-11 MED ORDER — DIAZEPAM 2 MG PO TABS
2.0000 mg | ORAL_TABLET | Freq: Once | ORAL | Status: AC
  Administered 2015-09-11: 2 mg via ORAL
  Filled 2015-09-11: qty 1

## 2015-09-11 MED ORDER — MECLIZINE HCL 25 MG PO TABS
25.0000 mg | ORAL_TABLET | Freq: Three times a day (TID) | ORAL | Status: DC | PRN
Start: 2015-09-11 — End: 2017-08-04

## 2015-09-11 NOTE — ED Notes (Signed)
Pt verbalized understanding of d/c instructions, prescriptions, and follow-up care. No further questions/concerns, VSS, ambulatory w/ steady gait (refused wheelchair) 

## 2015-09-11 NOTE — Discharge Instructions (Signed)
Take meclizine as prescribed as needed for dizziness. If not improving or worsening return to the emergency dept or follow up with your doctor.    Benign Positional Vertigo Vertigo is the feeling that you or your surroundings are moving when they are not. Benign positional vertigo is the most common form of vertigo. The cause of this condition is not serious (is benign). This condition is triggered by certain movements and positions (is positional). This condition can be dangerous if it occurs while you are doing something that could endanger you or others, such as driving.  CAUSES In many cases, the cause of this condition is not known. It may be caused by a disturbance in an area of the inner ear that helps your brain to sense movement and balance. This disturbance can be caused by a viral infection (labyrinthitis), head injury, or repetitive motion. RISK FACTORS This condition is more likely to develop in:  Women.  People who are 74 years of age or older. SYMPTOMS Symptoms of this condition usually happen when you move your head or your eyes in different directions. Symptoms may start suddenly, and they usually last for less than a minute. Symptoms may include:  Loss of balance and falling.  Feeling like you are spinning or moving.  Feeling like your surroundings are spinning or moving.  Nausea and vomiting.  Blurred vision.  Dizziness.  Involuntary eye movement (nystagmus). Symptoms can be mild and cause only slight annoyance, or they can be severe and interfere with daily life. Episodes of benign positional vertigo may return (recur) over time, and they may be triggered by certain movements. Symptoms may improve over time. DIAGNOSIS This condition is usually diagnosed by medical history and a physical exam of the head, neck, and ears. You may be referred to a health care provider who specializes in ear, nose, and throat (ENT) problems (otolaryngologist) or a provider who  specializes in disorders of the nervous system (neurologist). You may have additional testing, including:  MRI.  A CT scan.  Eye movement tests. Your health care provider may ask you to change positions quickly while he or she watches you for symptoms of benign positional vertigo, such as nystagmus. Eye movement may be tested with an electronystagmogram (ENG), caloric stimulation, the Dix-Hallpike test, or the roll test.  An electroencephalogram (EEG). This records electrical activity in your brain.  Hearing tests. TREATMENT Usually, your health care provider will treat this by moving your head in specific positions to adjust your inner ear back to normal. Surgery may be needed in severe cases, but this is rare. In some cases, benign positional vertigo may resolve on its own in 2-4 weeks. HOME CARE INSTRUCTIONS Safety  Move slowly.Avoid sudden body or head movements.  Avoid driving.  Avoid operating heavy machinery.  Avoid doing any tasks that would be dangerous to you or others if a vertigo episode would occur.  If you have trouble walking or keeping your balance, try using a cane for stability. If you feel dizzy or unstable, sit down right away.  Return to your normal activities as told by your health care provider. Ask your health care provider what activities are safe for you. General Instructions  Take over-the-counter and prescription medicines only as told by your health care provider.  Avoid certain positions or movements as told by your health care provider.  Drink enough fluid to keep your urine clear or pale yellow.  Keep all follow-up visits as told by your health care provider.  This is important. SEEK MEDICAL CARE IF:  You have a fever.  Your condition gets worse or you develop new symptoms.  Your family or friends notice any behavioral changes.  Your nausea or vomiting gets worse.  You have numbness or a "pins and needles" sensation. SEEK IMMEDIATE  MEDICAL CARE IF:  You have difficulty speaking or moving.  You are always dizzy.  You faint.  You develop severe headaches.  You have weakness in your legs or arms.  You have changes in your hearing or vision.  You develop a stiff neck.  You develop sensitivity to light.   This information is not intended to replace advice given to you by your health care provider. Make sure you discuss any questions you have with your health care provider.   Document Released: 03/03/2006 Document Revised: 02/14/2015 Document Reviewed: 09/18/2014 Elsevier Interactive Patient Education [employer]

## 2015-09-11 NOTE — ED Provider Notes (Signed)
CSN: [id number]     Arrival date & time 09/11/15  1247 History   First MD Initiated Contact with Patient 09/11/15 1358     Chief Complaint  Patient presents with  . Dizziness  . Chest Pain     (Consider location/radiation/quality/duration/timing/severity/associated sxs/prior Treatment) HPI Stephanie Gonzalez is a 75 y.o. female with hx of asthma, diabetes, presents to ED with complaint of dizziness. Patient reports sensation of room spinning that is worse with movement and walking for 4 days. She states she feels that she has to hold on to the wall when walking. She also complains of mild headache that comes and goes. She states she went to her primary care doctor today to get some medicines for her dizziness and states while in the waiting room she to vitamin. She reports that the vitamin got stuck in her esophagus and made her nauseated and she vomited 3 times. She states that she at that time she complained of chest pain that she attributed to a vitamin getting stuck and states she was vomiting and diaphoretic so she was sent to emergency department. Patient states after vomiting however she felt much better and she felt like the pill went down. She no longer had any pain in her chest or nausea or vomiting. She continues however to have dizziness. She reports similar episodes of dizziness "many in many years ago." She denies any other focal neurological symptoms. She denies any numbness or weakness in extremities. No blurred vision. No recent head or neck injuries. No other complaints    Past Medical History  Diagnosis Date  . ASTHMA 10/06/2007  . BURSITIS, RIGHT HIP 02/04/2010  . DIABETES MELLITUS, TYPE II 11/01/2009  . HYPERLIPIDEMIA 10/06/2007  . HYPERTENSION 10/06/2007  . Family history of adverse reaction to anesthesia     Mother had n/v  . Restless leg syndrome    Past Surgical History  Procedure Laterality Date  . Appendectomy    . Cholecystectomy    . Abdominal hysterectomy    . Tubal  ligation    . Tonsillectomy    . Knee arthroscopy Right   . Spinal cord stimulator insertion N/A 07/13/2014    Procedure: LUMBAR SPINAL CORD STIMULATOR INSERTION;  Surgeon: Venita Lick, MD;  Location: [facility] OR;  Service: Orthopedics;  Laterality: N/A;   No family history on file. Social History  Substance Use Topics  . Smoking status: Never Smoker   . Smokeless tobacco: Never Used  . Alcohol Use: Yes     Comment: rarely   OB History    No data available     Review of Systems  Constitutional: Negative for fever and chills.  Respiratory: Negative for cough, chest tightness and shortness of breath.   Cardiovascular: Negative for chest pain, palpitations and leg swelling.  Gastrointestinal: Negative for nausea, vomiting, abdominal pain and diarrhea.  Genitourinary: Negative for dysuria, flank pain, vaginal discharge and pelvic pain.  Musculoskeletal: Negative for myalgias, arthralgias, neck pain and neck stiffness.  Skin: Negative for rash.  Neurological: Positive for dizziness, light-headedness and headaches. Negative for weakness.  All other systems reviewed and are negative.     Allergies  Tetracycline  Home Medications   Prior to Admission medications   Medication Sig Start Date End Date Taking? Authorizing Provider  albuterol (PROAIR HFA) 108 (90 BASE) MCG/ACT inhaler Inhale 2 puffs into the lungs every 6 (six) hours as needed for wheezing.    Yes Historical Provider, MD  amLODipine (NORVASC) 5 MG tablet Take  1 tablet (5 mg total) by mouth daily. 10/24/14  Yes Gordy SaversPeter F Kwiatkowski, MD  atorvastatin (LIPITOR) 40 MG tablet Take 1 tablet (40 mg total) by mouth daily.   Yes Gordy SaversPeter F Kwiatkowski, MD  b complex vitamins tablet Take 1 tablet by mouth daily.   Yes Historical Provider, MD  clonazePAM (KLONOPIN) 1 MG tablet TAKE 1 TABLET BY MOUTH AT BEDTIME AS NEEDED Patient taking differently: TAKE 1 TABLET BY MOUTH AT BEDTIME AS NEEDED for restless legs 07/10/15  Yes Gordy SaversPeter F  Kwiatkowski, MD  glucose blood (FREESTYLE LITE) test strip USE TO CHECK BLOOD SUGAR DAILY AND PRN 05/15/14  Yes Gordy SaversPeter F Kwiatkowski, MD  HYDROcodone-acetaminophen [facility](NORCO) 10-325 MG per tablet Take 0.5 tablets by mouth 2 (two) times daily as needed for moderate pain.  08/11/14  Yes Historical Provider, MD  HYDROcodone-homatropine (HYCODAN) 5-1.5 MG/5ML syrup Take 5 mLs by mouth every 6 (six) hours as needed for cough. 05/15/15  Yes Gordy SaversPeter F Kwiatkowski, MD  Lancets (FREESTYLE) lancets USE TO CHECK BLOOD SUGAR DAILY AND PRN 05/15/14  Yes Gordy SaversPeter F Kwiatkowski, MD  LYSINE PO Take 1 tablet by mouth daily as needed (cold sores).    Yes Historical Provider, MD  metFORMIN (GLUCOPHAGE) 500 MG tablet Take 500 mg by mouth daily with breakfast. 09/09/12  Yes Gordy SaversPeter F Kwiatkowski, MD  sertraline (ZOLOFT) 100 MG tablet Take one-half tablet by  mouth daily Patient taking differently: Take 50 mg by mouth daily. Take one-half tablet by  mouth daily 10/24/14  Yes Gordy SaversPeter F Kwiatkowski, MD  vitamin B-12 (CYANOCOBALAMIN) 500 MCG tablet Take 500 mcg by mouth daily.   Yes Historical Provider, MD  acyclovir cream (ZOVIRAX) 5 % Apply 1 application topically every 3 (three) hours. Patient not taking: Reported on 09/11/2015 11/22/14   Gordy SaversPeter F Kwiatkowski, MD  magic mouthwash w/lidocaine SOLN Take 5 mLs by mouth 3 (three) times daily as needed for mouth pain. Patient not taking: Reported on 09/11/2015 03/09/15   Shirline Freesory Nafziger, NP  methocarbamol (ROBAXIN) 500 MG tablet Take 1 tablet (500 mg total) by mouth 3 (three) times daily as needed for muscle spasms. 07/13/14   Venita Lickahari Brooks, MD  predniSONE (DELTASONE) 10 MG tablet Take 1 tablet (10 mg total) by mouth 2 (two) times daily with a meal. 09/04/14   Gordy SaversPeter F Kwiatkowski, MD  promethazine (PHENERGAN) 12.5 MG tablet Take 1 tablet (12.5 mg total) by mouth every 6 (six) hours as needed for nausea or vomiting. Patient not taking: Reported on 09/11/2015 11/22/14   Gordy SaversPeter F Kwiatkowski, MD  valACYclovir  (VALTREX) 500 MG tablet TAKE 1 TABLET (500 MG TOTAL) BY MOUTH DAILY. 04/30/15   Gordy SaversPeter F Kwiatkowski, MD   BP 157/73 mmHg  Pulse 63  Temp(Src) 98 F (36.7 C) (Oral)  Resp 15  SpO2 100% Physical Exam  Constitutional: She is oriented to person, place, and time. She appears well-developed and well-nourished. No distress.  HENT:  Head: Normocephalic.  Eyes: Conjunctivae and EOM are normal. Pupils are equal, round, and reactive to light.  Neck: Normal range of motion. Neck supple.  Cardiovascular: Normal rate, regular rhythm and normal heart sounds.   Pulmonary/Chest: Effort normal and breath sounds normal. No respiratory distress. She has no wheezes. She has no rales.  Abdominal: Soft. Bowel sounds are normal. She exhibits no distension. There is no tenderness. There is no rebound.  Musculoskeletal: She exhibits no edema.  Neurological: She is alert and oriented to person, place, and time.  5/5 and equal upper and lower  extremity strength bilaterally. Equal grip strength bilaterally. Normal finger to nose and heel to shin. No pronator drift. Patellar reflexes 2+   Skin: Skin is warm and dry.  Psychiatric: She has a normal mood and affect. Her behavior is normal.  Nursing note and vitals reviewed.   ED Course  Procedures (including critical care time) Labs Review Labs Reviewed  BASIC METABOLIC PANEL - Abnormal; Notable for the following:    Glucose, Bld 109 (*)    All other components within normal limits  CBC  I-STAT TROPOININ, ED    Imaging Review Dg Chest 2 View  09/11/2015  CLINICAL DATA:  Dizziness and hypertension. EXAM: CHEST  2 VIEW COMPARISON:  October 06, 2007 FINDINGS: There is no edema or consolidation. Heart size and pulmonary vascularity are normal. No adenopathy. There is a stimulator with the tip in the mid thoracic region. No apparent bone lesions. There are surgical clips in the right upper abdomen. IMPRESSION: No edema or consolidation. Electronically Signed   By:  Bretta Bang III M.D.   On: 09/11/2015 13:25   I have personally reviewed and evaluated these images and lab results as part of my medical decision-making.  <ECG> ED ECG REPORT   Date: 09/11/2015  Rate: 60   Rhythm: normal sinus rhythm  QRS Axis: normal  Intervals: normal  ST/T Wave abnormalities: nonspecific T wave changes  Conduction Disutrbances:none  Narrative Interpretation:   Old EKG Reviewed: none available  I have personally reviewed the EKG tracing and agree with the computerized printout as noted.   MDM   Final diagnoses:  Vertigo    Patient with vertigo like symptoms for several days. She has no other complaints. Apparently she did have some nausea and vomiting at primary care doctor office, which has now resolved. She no longer admits any chest pain or vomiting. Her complaint is still dizziness. Will check labs, chest x-ray, will try Valium and meclizine for her symptoms and reassess. Consider central cause of her vertigo, patient is unable to have MRI due to having an implanted spinal stimulator.  4:10 PM Asian symptoms completely resolved with 2 mg of Valium and 25 mg of meclizine. Patient is ambulatory with no difficulty. I discussed patient with Dr. Eather Colas had who has seen her as well and agrees that this is most likely peripheral vertigo. At this time, patient will be discharged home with outpatient follow-up. I did however instruct her to return if her symptoms are not improving or worsening for further workup. I do not think her chest pain is due to ACS. She clearly reports pain only after swallowing the pill and improving after throwing it up. She has not had any episodes of chest pain or vomiting while in emergency department. She has been emergency department for 3-1/2 hours. Patient will be discharged home with meclizine.  Filed Vitals:   09/11/15 1330 09/11/15 1400 09/11/15 1515 09/11/15 1535  BP: 129/59 157/73 127/87   Pulse: 57 63 71   Temp:    98 F  (36.7 C)  TempSrc:      Resp: SpO2: 100% 100% 98%        Jaynie Crumble, PA-C 09/11/15 1624  Raeford Razor, MD 09/17/15 [phone]

## 2015-09-11 NOTE — ED Notes (Signed)
Per EMS - pt went to PCP this morning, reports dizziness x 4 days. Pt hx dizziness and wanted refill of meds. Pt became nauseated, vomited x 1 in restroom, began experiencing CP, vomited 3 x more. Chest pain in center chest. Denies pain at this time. 1 nitro relieved CP. Dizziness/headache after nitro. BP 150/86. Hx htn and vertigo and sensitivity to light, cataracts, macular degeneration.

## 2015-09-11 NOTE — Progress Notes (Signed)
   Subjective:    Patient ID: Stephanie RamusSue M Dascenzo, female    DOB: 1941/02/21, 75 y.o.   MRN: [id number]  HPI Here with her husband for 3 days of dizziness, where it feels like the room is spinning around her. This can be severe at times. She has a headache off and on. She has frequent nausea and she has vomited a few times this am. Then as she was sitting in our lobby she had the sudden onset of chest pain with SOB. This pain has a squeezing or pressure quality and it takes her breath. No pain in the back or jaw or down the arms. She had some coffee and and a muffin this am. She is a diabetic but here glucoses have been well controlled. She had a normal stress test in 1997 but no evalutions since.    Review of Systems  Constitutional: Negative.   Respiratory: Positive for chest tightness and shortness of breath. Negative for cough and wheezing.   Cardiovascular: Positive for chest pain. Negative for palpitations and leg swelling.  Gastrointestinal: Positive for nausea and vomiting. Negative for abdominal pain.  Neurological: Positive for dizziness and headaches.       Objective:   Physical Exam  Constitutional: She is oriented to person, place, and time.  In some distress with chest pains and she appears to have vertigo. Good color, alert   Eyes: Conjunctivae are normal. Pupils are equal, round, and reactive to light.  Neck: No thyromegaly present.  Cardiovascular: Regular rhythm, normal heart sounds and intact distal pulses.   No murmur heard. Rapid rate   Pulmonary/Chest: Effort normal and breath sounds normal. No respiratory distress. She has no wheezes. She has no rales. She exhibits no tenderness.  Abdominal: Soft. Bowel sounds are normal. She exhibits no distension and no mass. There is no tenderness. There is no rebound and no guarding.  Lymphadenopathy:    She has no cervical adenopathy.  Neurological: She is alert and oriented to person, place, and time.          Assessment &  Plan:  She appears to have had vertigo for several days but then developed the sudden onset of chest pain with SOB this morning. She was given a single 0.4 mg SL NTG with no apparent relief. She could be having esophageal spasms from the vomiting or she could be having angina pains. EMS was called and [facility].  Nelwyn SalisburyFRY,Aahan Marques A, MD

## 2015-09-11 NOTE — ED Notes (Signed)
Patient transported to X-ray 

## 2015-10-10 LAB — HM DIABETES EYE EXAM

## 2015-10-18 ENCOUNTER — Encounter: Payer: Self-pay | Admitting: Internal Medicine

## 2015-10-23 ENCOUNTER — Ambulatory Visit (INDEPENDENT_AMBULATORY_CARE_PROVIDER_SITE_OTHER): Payer: Medicare Other | Admitting: Internal Medicine

## 2015-10-23 ENCOUNTER — Encounter: Payer: Self-pay | Admitting: Internal Medicine

## 2015-10-23 VITALS — BP 140/90 | HR 70 | Temp 98.0°F | Resp 20 | Ht 63.0 in | Wt 178.0 lb

## 2015-10-23 DIAGNOSIS — J4520 Mild intermittent asthma, uncomplicated: Secondary | ICD-10-CM | POA: Diagnosis not present

## 2015-10-23 DIAGNOSIS — I10 Essential (primary) hypertension: Secondary | ICD-10-CM | POA: Diagnosis not present

## 2015-10-23 DIAGNOSIS — E119 Type 2 diabetes mellitus without complications: Secondary | ICD-10-CM | POA: Diagnosis not present

## 2015-10-23 DIAGNOSIS — L821 Other seborrheic keratosis: Secondary | ICD-10-CM

## 2015-10-23 LAB — HEMOGLOBIN A1C: HEMOGLOBIN A1C: 5.3 % (ref 4.6–6.5)

## 2015-10-23 MED ORDER — PREDNISONE 20 MG PO TABS: 20.0000 mg | ORAL_TABLET | Freq: Two times a day (BID) | ORAL | Status: DC

## 2015-10-23 MED ORDER — HYDROCODONE-HOMATROPINE 5-1.5 MG/5ML PO SYRP: 5.0000 mL | ORAL_SOLUTION | Freq: Four times a day (QID) | ORAL | Status: DC | PRN

## 2015-10-23 NOTE — Progress Notes (Signed)
Subjective:    Patient ID: Stephanie RamusSue M Fraley, female    DOB: Feb 13, 1941, 75 y.o.   MRN: [id number]  HPI  Lab Results  Component Value Date   HGBA1C 5.5 07/13/2014   75 year old patient who has essential hypertension and type 2 diabetes. She presents with a several-day history of increasing cough, chest congestion and intermittent wheezing.  She does have a history of mild uncomplicated asthma.  Her chief complaint is refractory cough.  She also has had some left-sided headaches and episodes of dizziness consistent with vertigo.  Last month she had a severe episode is associated with chest pain and she was admitted to the hospital.  Past Medical History  Diagnosis Date  . ASTHMA 10/06/2007  . BURSITIS, RIGHT HIP 02/04/2010  . DIABETES MELLITUS, TYPE II 11/01/2009  . HYPERLIPIDEMIA 10/06/2007  . HYPERTENSION 10/06/2007  . Family history of adverse reaction to anesthesia     Mother had n/v  . Restless leg syndrome      Social History   Social History  . Marital Status: Married    Spouse Name: N/A  . Number of Children: N/A  . Years of Education: N/A   Occupational History  . Not on file.   Social History Main Topics  . Smoking status: Never Smoker   . Smokeless tobacco: Never Used  . Alcohol Use: Yes     Comment: rarely  . Drug Use: No  . Sexual Activity: Not on file   Other Topics Concern  . Not on file   Social History Narrative    Past Surgical History  Procedure Laterality Date  . Appendectomy    . Cholecystectomy    . Abdominal hysterectomy    . Tubal ligation    . Tonsillectomy    . Knee arthroscopy Right   . Spinal cord stimulator insertion N/A 07/13/2014    Procedure: LUMBAR SPINAL CORD STIMULATOR INSERTION;  Surgeon: Venita Lickahari Brooks, MD;  Location: [facility] OR;  Service: Orthopedics;  Laterality: N/A;    No family history on file.  Allergies  Allergen Reactions  . Tetracycline     REACTION: rash, agitation    Current Outpatient Prescriptions on File Prior  to Visit  Medication Sig Dispense Refill  . acyclovir cream (ZOVIRAX) 5 % Apply 1 application topically every 3 (three) hours. 15 g 0  . albuterol (PROAIR HFA) 108 (90 BASE) MCG/ACT inhaler Inhale 2 puffs into the lungs every 6 (six) hours as needed for wheezing.     Marland Kitchen. amLODipine (NORVASC) 5 MG tablet Take 1 tablet (5 mg total) by mouth daily. 90 tablet 2  . atorvastatin (LIPITOR) 40 MG tablet Take 1 tablet (40 mg total) by mouth daily. 90 tablet 1  . b complex vitamins tablet Take 1 tablet by mouth daily.    . clonazePAM (KLONOPIN) 1 MG tablet TAKE 1 TABLET BY MOUTH AT BEDTIME AS NEEDED (Patient taking differently: TAKE 1 TABLET BY MOUTH AT BEDTIME AS NEEDED for restless legs) 90 tablet 1  . glucose blood (FREESTYLE LITE) test strip USE TO CHECK BLOOD SUGAR DAILY AND PRN 100 each 12  . HYDROcodone-acetaminophen (NORCO) 10-325 MG per tablet Take 0.5 tablets by mouth 2 (two) times daily as needed for moderate pain.   0  . Lancets (FREESTYLE) lancets USE TO CHECK BLOOD SUGAR DAILY AND PRN 100 each 12  . LYSINE PO Take 1 tablet by mouth daily as needed (cold sores).     . meclizine (ANTIVERT) 25 MG tablet Take 1 tablet (  25 mg total) by mouth 3 (three) times daily as needed for dizziness. 30 tablet 0  . metFORMIN (GLUCOPHAGE) 500 MG tablet Take 500 mg by mouth daily with breakfast.    . sertraline (ZOLOFT) 100 MG tablet Take one-half tablet by  mouth daily (Patient taking differently: Take 50 mg by mouth daily. Take one-half tablet by  mouth daily) 45 tablet 2  . valACYclovir (VALTREX) 500 MG tablet TAKE 1 TABLET (500 MG TOTAL) BY MOUTH DAILY. 7 tablet 5  . vitamin B-12 (CYANOCOBALAMIN) 500 MCG tablet Take 500 mcg by mouth daily.    Marland Kitchen HYDROcodone-homatropine (HYCODAN) 5-1.5 MG/5ML syrup Take 5 mLs by mouth every 6 (six) hours as needed for cough. (Patient not taking: Reported on 10/23/2015) 120 mL 0   No current facility-administered medications on file prior to visit.    BP 140/90 mmHg  Pulse 70   Temp(Src) 98 F (36.7 C) (Oral)  Resp 20  Ht  (1.6 m)  Wt 178 lb (80.74 kg)  BMI 31.54 kg/m2  SpO2 98%     Review of Systems  Constitutional: Positive for fatigue. Negative for fever.  HENT: Negative for congestion, dental problem, hearing loss, rhinorrhea, sinus pressure, sore throat and tinnitus.   Eyes: Negative for pain, discharge and visual disturbance.  Respiratory: Positive for cough and wheezing. Negative for shortness of breath.   Cardiovascular: Negative for chest pain, palpitations and leg swelling.  Gastrointestinal: Negative for nausea, vomiting, abdominal pain, diarrhea, constipation, blood in stool and abdominal distention.  Genitourinary: Negative for dysuria, urgency, frequency, hematuria, flank pain, vaginal bleeding, vaginal discharge, difficulty urinating, vaginal pain and pelvic pain.  Musculoskeletal: Negative for joint swelling, arthralgias and gait problem.  Skin: Negative for rash.  Neurological: Positive for dizziness and light-headedness. Negative for syncope, speech difficulty, weakness, numbness and headaches.  Hematological: Negative for adenopathy.  Psychiatric/Behavioral: Negative for behavioral problems, dysphoric mood and agitation. The patient is not nervous/anxious.        Objective:   Physical Exam  Constitutional: She is oriented to person, place, and time. She appears well-developed and well-nourished.  HENT:  Head: Normocephalic.  Right Ear: External ear normal.  Left Ear: External ear normal.  Mouth/Throat: Oropharynx is clear and moist.  Eyes: Conjunctivae and EOM are normal. Pupils are equal, round, and reactive to light.  Neck: Normal range of motion. Neck supple. No thyromegaly present.  Cardiovascular: Normal rate, regular rhythm, normal heart sounds and intact distal pulses.   Pulmonary/Chest: Effort normal and breath sounds normal. No respiratory distress. She has no wheezes. She has no rales.  Abdominal: Soft. Bowel sounds  are normal. She exhibits no mass. There is no tenderness.  Musculoskeletal: Normal range of motion.  Lymphadenopathy:    She has no cervical adenopathy.  Neurological: She is alert and oriented to person, place, and time.  Skin: Skin is warm and dry. No rash noted.  Psychiatric: She has a normal mood and affect. Her behavior is normal.          Assessment & Plan:  Viral URI with cough.  Will treat symptomatically Mild asthma.  Will treat with a brief prednisone regimen over 6 days Diabetes.  Check hemoglobin A1c Dyslipidemia

## 2015-10-23 NOTE — Patient Instructions (Addendum)
Limit your sodium (Salt) intake   Please check your hemoglobin A1c every 3-6 months    It is important that you exercise regularly, at least 20 minutes 3 to 4 times per week.  If you develop chest pain or shortness of breath seek  medical attention.  Please check your blood pressure on a regular basis.  If it is consistently greater than 150/90, please make an office appointment.  Acute bronchitis symptoms are generally not helped by antibiotics.  Take over-the-counter expectorants and cough medications such as  Mucinex DM.  Call if there is no improvement in 5 to 7 days or if  you develop worsening cough, fever, or new symptoms, such as shortness of breath or chest pain.    Benign Positional Vertigo Vertigo is the feeling that you or your surroundings are moving when they are not. Benign positional vertigo is the most common form of vertigo. The cause of this condition is not serious (is benign). This condition is triggered by certain movements and positions (is positional). This condition can be dangerous if it occurs while you are doing something that could endanger you or others, such as driving.  CAUSES In many cases, the cause of this condition is not known. It may be caused by a disturbance in an area of the inner ear that helps your brain to sense movement and balance. This disturbance can be caused by a viral infection (labyrinthitis), head injury, or repetitive motion. RISK FACTORS This condition is more likely to develop in:  Women.  People who are 43 years of age or older. SYMPTOMS Symptoms of this condition usually happen when you move your head or your eyes in different directions. Symptoms may start suddenly, and they usually last for less than a minute. Symptoms may include:  Loss of balance and falling.  Feeling like you are spinning or moving.  Feeling like your surroundings are spinning or moving.  Nausea and vomiting.  Blurred vision.  Dizziness.  Involuntary  eye movement (nystagmus). Symptoms can be mild and cause only slight annoyance, or they can be severe and interfere with daily life. Episodes of benign positional vertigo may return (recur) over time, and they may be triggered by certain movements. Symptoms may improve over time. DIAGNOSIS This condition is usually diagnosed by medical history and a physical exam of the head, neck, and ears. You may be referred to a health care provider who specializes in ear, nose, and throat (ENT) problems (otolaryngologist) or a provider who specializes in disorders of the nervous system (neurologist). You may have additional testing, including:  MRI.  A CT scan.  Eye movement tests. Your health care provider may ask you to change positions quickly while he or she watches you for symptoms of benign positional vertigo, such as nystagmus. Eye movement may be tested with an electronystagmogram (ENG), caloric stimulation, the Dix-Hallpike test, or the roll test.  An electroencephalogram (EEG). This records electrical activity in your brain.  Hearing tests. TREATMENT Usually, your health care provider will treat this by moving your head in specific positions to adjust your inner ear back to normal. Surgery may be needed in severe cases, but this is rare. In some cases, benign positional vertigo may resolve on its own in 2-4 weeks. HOME CARE INSTRUCTIONS Safety  Move slowly.Avoid sudden body or head movements.  Avoid driving.  Avoid operating heavy machinery.  Avoid doing any tasks that would be dangerous to you or others if a vertigo episode would occur.  If  you have trouble walking or keeping your balance, try using a cane for stability. If you feel dizzy or unstable, sit down right away.  Return to your normal activities as told by your health care provider. Ask your health care provider what activities are safe for you. General Instructions  Take over-the-counter and prescription medicines only as  told by your health care provider.  Avoid certain positions or movements as told by your health care provider.  Drink enough fluid to keep your urine clear or pale yellow.  Keep all follow-up visits as told by your health care provider. This is important. SEEK MEDICAL CARE IF:  You have a fever.  Your condition gets worse or you develop new symptoms.  Your family or friends notice any behavioral changes.  Your nausea or vomiting gets worse.  You have numbness or a "pins and needles" sensation. SEEK IMMEDIATE MEDICAL CARE IF:  You have difficulty speaking or moving.  You are always dizzy.  You faint.  You develop severe headaches.  You have weakness in your legs or arms.  You have changes in your hearing or vision.  You develop a stiff neck.  You develop sensitivity to light.   This information is not intended to replace advice given to you by your health care provider. Make sure you discuss any questions you have with your health care provider.   Document Released: 03/03/2006 Document Revised: 02/14/2015 Document Reviewed: 09/18/2014 Elsevier Interactive Patient Education [employer]2016 Elsevier Inc.

## 2015-10-23 NOTE — Progress Notes (Signed)
Pre visit review using our clinic review tool, if applicable. No additional management support is needed unless otherwise documented below in the visit note. 

## 2015-10-24 ENCOUNTER — Other Ambulatory Visit: Payer: Self-pay | Admitting: Internal Medicine

## 2015-11-14 ENCOUNTER — Telehealth: Payer: Self-pay | Admitting: Internal Medicine

## 2015-11-14 NOTE — Telephone Encounter (Signed)
Pt request refill  HYDROcodone-homatropine (HYCODAN) 5-1.5 MG/5ML syrup  Pt seen 5/16 and now has the same symptoms. Pt states this is the only thing that will break it up Pt aware dr Kirtland Bouchardk is out , but will wait if she has to for him to a answer request.

## 2015-11-15 NOTE — Telephone Encounter (Signed)
Okay to refill Hycodan cough syrup? 

## 2015-11-16 ENCOUNTER — Telehealth: Payer: Self-pay | Admitting: Internal Medicine

## 2015-11-16 MED ORDER — HYDROCODONE-HOMATROPINE 5-1.5 MG/5ML PO SYRP: 5.0000 mL | ORAL_SOLUTION | Freq: Four times a day (QID) | ORAL | Status: DC | PRN

## 2015-11-16 NOTE — Telephone Encounter (Signed)
Pt notified Rx ready for pickup. Rx printed and signed.  

## 2015-11-16 NOTE — Telephone Encounter (Signed)
Pt still waiting on a call back.

## 2015-11-16 NOTE — Telephone Encounter (Signed)
Okay 6 ounces 

## 2015-11-16 NOTE — Telephone Encounter (Signed)
Pt called about Rx.

## 2015-12-12 ENCOUNTER — Other Ambulatory Visit: Payer: Self-pay | Admitting: Internal Medicine

## 2015-12-12 ENCOUNTER — Telehealth: Payer: Self-pay

## 2015-12-12 NOTE — Telephone Encounter (Signed)
Patient is on the list for [facility] 2017 and may be a good candidate for an AWV in 2017. Please let me know if/when appt is scheduled.   

## 2016-01-03 ENCOUNTER — Other Ambulatory Visit: Payer: Self-pay | Admitting: Internal Medicine

## 2016-01-04 NOTE — Telephone Encounter (Signed)
Rx refill sent to pharmacy. 

## 2016-01-04 NOTE — Telephone Encounter (Signed)
Okay to refill? 

## 2016-01-14 ENCOUNTER — Ambulatory Visit (INDEPENDENT_AMBULATORY_CARE_PROVIDER_SITE_OTHER): Payer: Medicare Other | Admitting: Internal Medicine

## 2016-01-14 ENCOUNTER — Encounter: Payer: Self-pay | Admitting: Internal Medicine

## 2016-01-14 VITALS — BP 160/82 | HR 75 | Temp 98.2°F | Ht 63.0 in | Wt 178.0 lb

## 2016-01-14 DIAGNOSIS — L03032 Cellulitis of left toe: Secondary | ICD-10-CM | POA: Diagnosis not present

## 2016-01-14 DIAGNOSIS — E119 Type 2 diabetes mellitus without complications: Secondary | ICD-10-CM | POA: Diagnosis not present

## 2016-01-14 DIAGNOSIS — I10 Essential (primary) hypertension: Secondary | ICD-10-CM

## 2016-01-14 DIAGNOSIS — L03115 Cellulitis of right lower limb: Secondary | ICD-10-CM

## 2016-01-14 MED ORDER — AMOXICILLIN-POT CLAVULANATE 875-125 MG PO TABS: 1.0000 | ORAL_TABLET | Freq: Two times a day (BID) | ORAL | 0 refills | Status: DC

## 2016-01-14 NOTE — Progress Notes (Signed)
Subjective:    Patient ID: Alfonso RamusSue M Stream, female    DOB: 05/13/1941, 75 y.o.   MRN: [id number]  HPI 75 year old patient who has a history of well-controlled type 2 diabetes as well as essential hypertension. Patient presents today with 2 dermatologic concerns She has developed a lesion involving her right lower lateral leg that has become redder and more painful.  She feels this initially started as a insect bite She also has had pain and swelling involving her left third distal toe  Past Medical History:  Diagnosis Date  . ASTHMA 10/06/2007  . BURSITIS, RIGHT HIP 02/04/2010  . DIABETES MELLITUS, TYPE II 11/01/2009  . Family history of adverse reaction to anesthesia    Mother had n/v  . HYPERLIPIDEMIA 10/06/2007  . HYPERTENSION 10/06/2007  . Restless leg syndrome      Social History   Social History  . Marital status: Married    Spouse name: N/A  . Number of children: N/A  . Years of education: N/A   Occupational History  . Not on file.   Social History Main Topics  . Smoking status: Never Smoker  . Smokeless tobacco: Never Used  . Alcohol use Yes     Comment: rarely  . Drug use: No  . Sexual activity: Not on file   Other Topics Concern  . Not on file   Social History Narrative  . No narrative on file    Past Surgical History:  Procedure Laterality Date  . ABDOMINAL HYSTERECTOMY    . APPENDECTOMY    . CHOLECYSTECTOMY    . KNEE ARTHROSCOPY Right   . SPINAL CORD STIMULATOR INSERTION N/A 07/13/2014   Procedure: LUMBAR SPINAL CORD STIMULATOR INSERTION;  Surgeon: Venita Lickahari Brooks, MD;  Location: [facility] OR;  Service: Orthopedics;  Laterality: N/A;  . TONSILLECTOMY    . TUBAL LIGATION      No family history on file.  Allergies  Allergen Reactions  . Tetracycline     REACTION: rash, agitation    Current Outpatient Prescriptions on File Prior to Visit  Medication Sig Dispense Refill  . acyclovir cream (ZOVIRAX) 5 % Apply 1 application topically every 3 (three) hours.  15 g 0  . albuterol (PROAIR HFA) 108 (90 BASE) MCG/ACT inhaler Inhale 2 puffs into the lungs every 6 (six) hours as needed for wheezing.     Marland Kitchen. amLODipine (NORVASC) 5 MG tablet Take 1 tablet (5 mg total) by mouth daily. 90 tablet 2  . atorvastatin (LIPITOR) 40 MG tablet Take 1 tablet by mouth  daily 90 tablet 1  . b complex vitamins tablet Take 1 tablet by mouth daily.    . clonazePAM (KLONOPIN) 1 MG tablet TAKE 1 TABLET AT BEDTIME AS NEEDED 90 tablet 1  . glucose blood (FREESTYLE LITE) test strip USE TO CHECK BLOOD SUGAR DAILY AND PRN 100 each 12  . HYDROcodone-acetaminophen (NORCO) 10-325 MG per tablet Take 0.5 tablets by mouth 2 (two) times daily as needed for moderate pain.   0  . Lancets (FREESTYLE) lancets USE TO CHECK BLOOD SUGAR DAILY AND PRN 100 each 12  . LYSINE PO Take 1 tablet by mouth daily as needed (cold sores).     . meclizine (ANTIVERT) 25 MG tablet Take 1 tablet (25 mg total) by mouth 3 (three) times daily as needed for dizziness. 30 tablet 0  . metFORMIN (GLUCOPHAGE) 500 MG tablet Take 1 tablet by mouth  twice a day with meals 180 tablet 1  . prednisoLONE acetate (PRED  FORTE) 1 % ophthalmic suspension INSTILL 1 DROP INTO BOTH EYES 4 TIMES A DAY  0  . sertraline (ZOLOFT) 100 MG tablet Take one-half tablet by  mouth daily 45 tablet 0  . valACYclovir (VALTREX) 500 MG tablet TAKE 1 TABLET (500 MG TOTAL) BY MOUTH DAILY. 7 tablet 5  . HYDROcodone-homatropine (HYCODAN) 5-1.5 MG/5ML syrup Take 5 mLs by mouth every 6 (six) hours as needed for cough. (Patient not taking: Reported on 01/14/2016) 120 mL 0   No current facility-administered medications on file prior to visit.     BP (!) 160/82 (BP Location: Left Arm, Patient Position: Sitting, Cuff Size: Normal)   Pulse 75   Temp 98.2 F (36.8 C) (Oral)   Ht  (1.6 m)   Wt 178 lb (80.7 kg)   SpO2 98%   BMI 31.53 kg/m      Review of Systems  Constitutional: Negative.   Skin: Positive for rash and wound.       Objective:     Physical Exam  Constitutional: She appears well-developed and well-nourished. No distress.  Skin:  Acute paronychia involving the left third distal toe  A 3 cm area of tenderness and erythema, slightly raised involving the right lateral lower leg          Assessment & Plan:   Paronychia left third distal toe  Early cellulitis, right lateral lower leg.  Local skin care discussed.  Will treat with Augmentin for 10 days.  Rogelia Boga, MD

## 2016-01-14 NOTE — Patient Instructions (Addendum)
Cellulitis Cellulitis is an infection of the skin and the tissue beneath it. The infected area is usually red and tender. Cellulitis occurs most often in the arms and lower legs.  CAUSES  Cellulitis is caused by bacteria that enter the skin through cracks or cuts in the skin. The most common types of bacteria that cause cellulitis are staphylococci and streptococci. SIGNS AND SYMPTOMS   Redness and warmth.  Swelling.  Tenderness or pain.  Fever. DIAGNOSIS  Your health care provider can usually determine what is wrong based on a physical exam. Blood tests may also be done. TREATMENT  Treatment usually involves taking an antibiotic medicine. HOME CARE INSTRUCTIONS   Take your antibiotic medicine as directed by your health care provider. Finish the antibiotic even if you start to feel better.  Keep the infected arm or leg elevated to reduce swelling.  Apply a warm cloth to the affected area up to 4 times per day to relieve pain.  Take medicines only as directed by your health care provider.  Keep all follow-up visits as directed by your health care provider. SEEK MEDICAL CARE IF:   You notice red streaks coming from the infected area.  Your red area gets larger or turns dark in color.  Your bone or joint underneath the infected area becomes painful after the skin has healed.  Your infection returns in the same area or another area.  You notice a swollen bump in the infected area.  You develop new symptoms.  You have a fever. SEEK IMMEDIATE MEDICAL CARE IF:   You feel very sleepy.  You develop vomiting or diarrhea.  You have a general ill feeling (malaise) with muscle aches and pains.   This information is not intended to replace advice given to you by your health care provider. Make sure you discuss any questions you have with your health care provider.   Document Released: 03/05/2005 Document Revised: 02/14/2015 Document Reviewed: 08/11/2011 Elsevier Interactive  Patient Education 2016 Elsevier Inc.  Paronychia  Paronychia is an infection of the skin. It happens near a fingernail or toenail. It may cause pain and swelling around the nail. Usually, it is not serious and it clears up with treatment. HOME CARE  Soak the fingers or toes in warm water as told by your doctor. You may be told to do this for 20 minutes, 2-3 times a day.  Keep the area dry when you are not soaking it.  Take medicines only as told by your doctor.  If you were given an antibiotic medicine, finish all of it even if you start to feel better.  Keep the affected area clean.  Do not try to drain a fluid-filled bump yourself.  Wear rubber gloves when putting your hands in water.  Wear gloves if your hands might touch cleaners or chemicals.  Follow your doctor's instructions about:  Wound care.  Bandage (dressing) changes and removal. GET HELP IF:  Your symptoms get worse or do not improve.  You have a fever or chills.  You have redness spreading from the affected area.  You have more fluid, blood, or pus coming from the affected area.  Your finger or knuckle is swollen or is hard to move.   This information is not intended to replace advice given to you by your health care provider. Make sure you discuss any questions you have with your health care provider.   Document Released: 05/14/2009 Document Revised: 10/10/2014 Document Reviewed: 05/03/2014 Elsevier Interactive Patient Education 2016  [employer].

## 2016-01-29 ENCOUNTER — Ambulatory Visit: Payer: Medicare Other | Admitting: Family Medicine

## 2016-03-04 ENCOUNTER — Ambulatory Visit (INDEPENDENT_AMBULATORY_CARE_PROVIDER_SITE_OTHER): Payer: Medicare Other | Admitting: Internal Medicine

## 2016-03-04 ENCOUNTER — Encounter: Payer: Self-pay | Admitting: Internal Medicine

## 2016-03-04 VITALS — BP 150/90 | HR 65 | Temp 98.4°F | Resp 20 | Ht 63.0 in | Wt 175.5 lb

## 2016-03-04 DIAGNOSIS — J028 Acute pharyngitis due to other specified organisms: Secondary | ICD-10-CM | POA: Diagnosis not present

## 2016-03-04 DIAGNOSIS — I10 Essential (primary) hypertension: Secondary | ICD-10-CM

## 2016-03-04 MED ORDER — HYDROCODONE-ACETAMINOPHEN 10-325 MG PO TABS
0.5000 | ORAL_TABLET | Freq: Two times a day (BID) | ORAL | 0 refills | Status: AC | PRN
Start: 2016-03-04 — End: ?

## 2016-03-04 NOTE — Patient Instructions (Addendum)
Aleve 2 tablets twice daily  Use hydrocodone pain reliever every 6 hours as needed  Use a lozenger such as Cepastat as needed for sore throat   Sore Throat A sore throat is a painful, burning, sore, or scratchy feeling of the throat. There may be pain or tenderness when swallowing or talking. You may have other symptoms with a sore throat. These include coughing, sneezing, fever, or a swollen neck. A sore throat is often the first sign of another sickness. These sicknesses may include a cold, flu, strep throat, or an infection called mono. Most sore throats go away without medical treatment.  HOME CARE   Only take medicine as told by your doctor.  Drink enough fluids to keep your pee (urine) clear or pale yellow.  Rest as needed.  Try using throat sprays, lozenges, or suck on hard candy (if older than 4 years or as told).  Sip warm liquids, such as broth, herbal tea, or warm water with honey. Try sucking on frozen ice pops or drinking cold liquids.  Rinse the mouth (gargle) with salt water. Mix 1 teaspoon salt with 8 ounces of water.  Do not smoke. Avoid being around others when they are smoking.  Put a humidifier in your bedroom at night to moisten the air. You can also turn on a hot shower and sit in the bathroom for 5-10 minutes. Be sure the bathroom door is closed. GET HELP RIGHT AWAY IF:   You have trouble breathing.  You cannot swallow fluids, soft foods, or your spit (saliva).  You have more puffiness (swelling) in the throat.  Your sore throat does not get better in 7 days.  You feel sick to your stomach (nauseous) and throw up (vomit).  You have a fever or lasting symptoms for more than 2-3 days.  You have a fever and your symptoms suddenly get worse. MAKE SURE YOU:   Understand these instructions.  Will watch your condition.  Will get help right away if you are not doing well or get worse.   This information is not intended to replace advice given to you by  your health care provider. Make sure you discuss any questions you have with your health care provider.   Document Released: 03/04/2008 Document Revised: 02/18/2012 Document Reviewed: 02/01/2012 Elsevier Interactive Patient Education [employer] Elsevier Inc.
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
## 2016-03-19 LAB — HM DIABETES EYE EXAM

## 2016-03-26 ENCOUNTER — Ambulatory Visit (INDEPENDENT_AMBULATORY_CARE_PROVIDER_SITE_OTHER): Payer: Medicare Other

## 2016-03-26 DIAGNOSIS — Z23 Encounter for immunization: Secondary | ICD-10-CM

## 2016-04-16 ENCOUNTER — Telehealth: Payer: Self-pay

## 2016-04-16 NOTE — Telephone Encounter (Signed)
Spoke to Stephanie Gonzalez,  Her and spouse coming in 1/23 at 8am for labs; Agreed to stay post lab for AWV. To schedule 9 and 10 because their schedule was blocked for 8 and 8:30;  

## 2016-05-21 ENCOUNTER — Other Ambulatory Visit: Payer: Self-pay | Admitting: Internal Medicine
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
## 2016-07-01 ENCOUNTER — Ambulatory Visit (INDEPENDENT_AMBULATORY_CARE_PROVIDER_SITE_OTHER): Payer: Medicare Other

## 2016-07-01 ENCOUNTER — Other Ambulatory Visit (INDEPENDENT_AMBULATORY_CARE_PROVIDER_SITE_OTHER): Payer: Medicare Other

## 2016-07-01 VITALS — BP 164/98 | HR 68 | Ht 66.0 in | Wt 175.0 lb

## 2016-07-01 DIAGNOSIS — E785 Hyperlipidemia, unspecified: Secondary | ICD-10-CM

## 2016-07-01 DIAGNOSIS — E119 Type 2 diabetes mellitus without complications: Secondary | ICD-10-CM | POA: Diagnosis not present

## 2016-07-01 DIAGNOSIS — Z0000 Encounter for general adult medical examination without abnormal findings: Secondary | ICD-10-CM

## 2016-07-01 DIAGNOSIS — I10 Essential (primary) hypertension: Secondary | ICD-10-CM | POA: Diagnosis not present

## 2016-07-01 DIAGNOSIS — Z780 Asymptomatic menopausal state: Secondary | ICD-10-CM | POA: Diagnosis not present

## 2016-07-01 LAB — CBC WITH DIFFERENTIAL/PLATELET
Basophils Absolute: 0 10*3/uL (ref 0.0–0.1)
Basophils Relative: 0.4 % (ref 0.0–3.0)
EOS PCT: 1.3 % (ref 0.0–5.0)
Eosinophils Absolute: 0.1 10*3/uL (ref 0.0–0.7)
HEMATOCRIT: 38.3 % (ref 36.0–46.0)
Hemoglobin: 13.1 g/dL (ref 12.0–15.0)
LYMPHS PCT: 34.4 % (ref 12.0–46.0)
Lymphs Abs: 3.8 10*3/uL (ref 0.7–4.0)
MCHC: 34.2 g/dL (ref 30.0–36.0)
MCV: 82 fl (ref 78.0–100.0)
MONOS PCT: 6.9 % (ref 3.0–12.0)
Monocytes Absolute: 0.8 10*3/uL (ref 0.1–1.0)
NEUTROS ABS: 6.3 10*3/uL (ref 1.4–7.7)
NEUTROS PCT: 57 % (ref 43.0–77.0)
PLATELETS: 228 10*3/uL (ref 150.0–400.0)
RBC: 4.68 Mil/uL (ref 3.87–5.11)
RDW: 13.5 % (ref 11.5–15.5)
WBC: 11 10*3/uL — ABNORMAL HIGH (ref 4.0–10.5)

## 2016-07-01 LAB — HEPATIC FUNCTION PANEL
ALBUMIN: 4 g/dL (ref 3.5–5.2)
ALK PHOS: 109 U/L (ref 39–117)
ALT: 8 U/L (ref 0–35)
AST: 14 U/L (ref 0–37)
Bilirubin, Direct: 0.1 mg/dL (ref 0.0–0.3)
TOTAL PROTEIN: 7.2 g/dL (ref 6.0–8.3)
Total Bilirubin: 0.8 mg/dL (ref 0.2–1.2)

## 2016-07-01 LAB — BASIC METABOLIC PANEL
BUN: 17 mg/dL (ref 6–23)
CHLORIDE: 102 meq/L (ref 96–112)
CO2: 30 meq/L (ref 19–32)
Calcium: 9.4 mg/dL (ref 8.4–10.5)
Creatinine, Ser: 0.71 mg/dL (ref 0.40–1.20)
GFR: 85.47 mL/min (ref >60.00)
GLUCOSE: 100 mg/dL — AB (ref 70–99)
POTASSIUM: 3.6 meq/L (ref 3.5–5.1)
SODIUM: 139 meq/L (ref 135–145)

## 2016-07-01 LAB — HEMOGLOBIN A1C: Hgb A1c MFr Bld: 5.1 % (ref 4.6–6.5)

## 2016-07-01 LAB — LIPID PANEL
CHOLESTEROL: 184 mg/dL (ref 0–200)
HDL: 36.7 mg/dL — AB (ref >39.00)
NonHDL: 147.14
Total CHOL/HDL Ratio: 5
Triglycerides: 395 mg/dL — ABNORMAL HIGH (ref 0.0–149.0)
VLDL: 79 mg/dL — AB (ref 0.0–40.0)

## 2016-07-01 LAB — MICROALBUMIN / CREATININE URINE RATIO
CREATININE, U: 228.4 mg/dL
MICROALB UR: 3.8 mg/dL — AB (ref 0.0–1.9)
MICROALB/CREAT RATIO: 1.7 mg/g (ref 0.0–30.0)

## 2016-07-01 LAB — LDL CHOLESTEROL, DIRECT: LDL DIRECT: 66 mg/dL

## 2016-07-01 LAB — POC URINALSYSI DIPSTICK (AUTOMATED)
Bilirubin, UA: NEGATIVE
Blood, UA: NEGATIVE
Glucose, UA: NEGATIVE
KETONES UA: NEGATIVE
NITRITE UA: NEGATIVE
PH UA: 6.5
PROTEIN UA: NEGATIVE
Spec Grav, UA: 1.02
Urobilinogen, UA: 0.2

## 2016-07-01 LAB — TSH: TSH: 2.85 u[IU]/mL (ref 0.35–4.50)

## 2016-07-01 NOTE — Patient Instructions (Addendum)
Stephanie Gonzalez , Thank you for taking time to come for your Medicare Wellness Visit. I appreciate your ongoing commitment to your health goals. Please review the following plan we discussed and let me know if I can assist you in the future.   Will try to drink more water  Will complete HCPOA for her and spouse   Hearing screen / Dr. Raliegh Ip to check right ears  Resource for adpative hearing equipment; [facility] [place]  No reviews  [employer] Office  [place] [address]  [phone]   [facility] [place] - can assist with hearing aid x 1  No reviews  [address] Office  [place] [address]  [phone]   To complete advanced directives Will try to complete AD; Given copy  Referred to [facility] for questions [facility] offers free advance directive forms, as well as assistance in completing the forms themselves. For assistance, contact the Spiritual Care Department at [phone], or the Clinical Social Work Department at [phone].   Will schedule mammogram; you can go to the breast center; or [facility]  Just have a copy sent to Dr. Raliegh Ip   Will also have a dexa at that time   The breast center Address: [address], [place], [place] [address]  Hours:  Open ? Closes 5PM                       Phone: [phone]   [name] to order the dexa     These are the goals we discussed: Goals    . Weight (lb) < 165 lb (74.8 kg)          Continue your walking Drinking more water        This is a list of the screening recommended for you and due dates:  Health Maintenance  Topic Date Due  . DEXA scan (bone density measurement)  03/28/2007  . Mammogram  10/28/2014  . Urine Protein Check  06/14/2015  . Hemoglobin A1C  04/24/2016  . Eye exam for diabetics  10/09/2016  . Complete foot exam   10/22/2016  . Colon Cancer Screening  09/08/2019  . Tetanus Vaccine  04/02/2020  . Flu Shot  Completed  . Shingles Vaccine   Completed  . Pneumonia vaccines  Completed       Bone Densitometry Introduction Bone densitometry is an imaging test that uses a special X-ray to measure the amount of calcium and other minerals in your bones (bone density). This test is also known as a bone mineral density test or dual-energy X-ray absorptiometry (DXA). The test can measure bone density at your hip and your spine. It is similar to having a regular X-ray. You may have this test to:  Diagnose a condition that causes weak or thin bones (osteoporosis).  Predict your risk of a broken bone (fracture).  Determine how well osteoporosis treatment is working. Tell a health care provider about:  Any allergies you have.  All medicines you are taking, including vitamins, herbs, eye drops, creams, and over-the-counter medicines.  Any problems you or family members have had with anesthetic medicines.  Any blood disorders you have.  Any surgeries you have had.  Any medical conditions you have.  Possibility of pregnancy.  Any other medical test you had within the previous 14 days that used contrast material. What are the risks? Generally, this is a safe procedure. However,  problems can occur and may include the following:  This test exposes you to a very small amount of radiation.  The risks of radiation exposure may be greater to unborn children. What happens before the procedure?  Do not take any calcium supplements for 24 hours before having the test. You can otherwise eat and drink what you usually do.  Take off all metal jewelry, eyeglasses, dental appliances, and any other metal objects. What happens during the procedure?  You may lie on an exam table. There will be an X-ray generator below you and an imaging device above you.  Other devices, such as boxes or braces, may be used to position your body properly for the scan.  You will need to lie still while the machine slowly scans your body.  The images  will show up on a computer monitor. What happens after the procedure? You may need more testing at a later time. This information is not intended to replace advice given to you by your health care provider. Make sure you discuss any questions you have with your health care provider. Document Released: 06/17/2004 Document Revised: 11/01/2015 Document Reviewed: 11/03/2013  2017 Elsevier   Fall Prevention in the Home Introduction Falls can cause injuries. They can happen to people of all ages. There are many things you can do to make your home safe and to help prevent falls. What can I do on the outside of my home?  Regularly fix the edges of walkways and driveways and fix any cracks.  Remove anything that might make you trip as you walk through a door, such as a raised step or threshold.  Trim any bushes or trees on the path to your home.  Use bright outdoor lighting.  Clear any walking paths of anything that might make someone trip, such as rocks or tools.  Regularly check to see if handrails are loose or broken. Make sure that both sides of any steps have handrails.  Any raised decks and porches should have guardrails on the edges.  Have any leaves, snow, or ice cleared regularly.  Use sand or salt on walking paths during winter.  Clean up any spills in your garage right away. This includes oil or grease spills. What can I do in the bathroom?  Use night lights.  Install grab bars by the toilet and in the tub and shower. Do not use towel bars as grab bars.  Use non-skid mats or decals in the tub or shower.  If you need to sit down in the shower, use a plastic, non-slip stool.  Keep the floor dry. Clean up any water that spills on the floor as soon as it happens.  Remove soap buildup in the tub or shower regularly.  Attach bath mats securely with double-sided non-slip rug tape.  Do not have throw rugs and other things on the floor that can make you trip. What can I do  in the bedroom?  Use night lights.  Make sure that you have a light by your bed that is easy to reach.  Do not use any sheets or blankets that are too big for your bed. They should not hang down onto the floor.  Have a firm chair that has side arms. You can use this for support while you get dressed.  Do not have throw rugs and other things on the floor that can make you trip. What can I do in the kitchen?  Clean up any spills right away.  Avoid walking on wet floors.  Keep items that you use a lot in easy-to-reach places.  If you need to reach something above you, use a strong step stool that has a grab bar.  Keep electrical cords out of the way.  Do not use floor polish or wax that makes floors slippery. If you must use wax, use non-skid floor wax.  Do not have throw rugs and other things on the floor that can make you trip. What can I do with my stairs?  Do not leave any items on the stairs.  Make sure that there are handrails on both sides of the stairs and use them. Fix handrails that are broken or loose. Make sure that handrails are as long as the stairways.  Check any carpeting to make sure that it is firmly attached to the stairs. Fix any carpet that is loose or worn.  Avoid having throw rugs at the top or bottom of the stairs. If you do have throw rugs, attach them to the floor with carpet tape.  Make sure that you have a light switch at the top of the stairs and the bottom of the stairs. If you do not have them, ask someone to add them for you. What else can I do to help prevent falls?  Wear shoes that:  Do not have high heels.  Have rubber bottoms.  Are comfortable and fit you well.  Are closed at the toe. Do not wear sandals.  If you use a stepladder:  Make sure that it is fully opened. Do not climb a closed stepladder.  Make sure that both sides of the stepladder are locked into place.  Ask someone to hold it for you, if possible.  Clearly mark  and make sure that you can see:  Any grab bars or handrails.  First and last steps.  Where the edge of each step is.  Use tools that help you move around (mobility aids) if they are needed. These include:  Canes.  Walkers.  Scooters.  Crutches.  Turn on the lights when you go into a dark area. Replace any light bulbs as soon as they burn out.  Set up your furniture so you have a clear path. Avoid moving your furniture around.  If any of your floors are uneven, fix them.  If there are any pets around you, be aware of where they are.  Review your medicines with your doctor. Some medicines can make you feel dizzy. This can increase your chance of falling. Ask your doctor what other things that you can do to help prevent falls. This information is not intended to replace advice given to you by your health care provider. Make sure you discuss any questions you have with your health care provider. Document Released: 03/22/2009 Document Revised: 11/01/2015 Document Reviewed: 06/30/2014  2017 Elsevier  Health Maintenance, Female Introduction Adopting a healthy lifestyle and getting preventive care can go a long way to promote health and wellness. Talk with your health care provider about what schedule of regular examinations is right for you. This is a good chance for you to check in with your provider about disease prevention and staying healthy. In between checkups, there are plenty of things you can do on your own. Experts have done a lot of research about which lifestyle changes and preventive measures are most likely to keep you healthy. Ask your health care provider for more information. Weight and diet Eat a healthy diet  Be sure  to include plenty of vegetables, fruits, low-fat dairy products, and lean protein.  Do not eat a lot of foods high in solid fats, added sugars, or salt.  Get regular exercise. This is one of the most important things you can do for your  health.  Most adults should exercise for at least 150 minutes each week. The exercise should increase your heart rate and make you sweat (moderate-intensity exercise).  Most adults should also do strengthening exercises at least twice a week. This is in addition to the moderate-intensity exercise. Maintain a healthy weight  Body mass index (BMI) is a measurement that can be used to identify possible weight problems. It estimates body fat based on height and weight. Your health care provider can help determine your BMI and help you achieve or maintain a healthy weight.  For females 56 years of age and older:  A BMI below 18.5 is considered underweight.  A BMI of 18.5 to 24.9 is normal.  A BMI of 25 to 29.9 is considered overweight.  A BMI of 30 and above is considered obese. Watch levels of cholesterol and blood lipids  You should start having your blood tested for lipids and cholesterol at 75 years of age, then have this test every 5 years.  You may need to have your cholesterol levels checked more often if:  Your lipid or cholesterol levels are high.  You are older than 75 years of age.  You are at high risk for heart disease. Cancer screening Lung Cancer  Lung cancer screening is recommended for adults 31-34 years old who are at high risk for lung cancer because of a history of smoking.  A yearly low-dose CT scan of the lungs is recommended for people who:  Currently smoke.  Have quit within the past 15 years.  Have at least a 30-pack-year history of smoking. A pack year is smoking an average of one pack of cigarettes a day for 1 year.  Yearly screening should continue until it has been 15 years since you quit.  Yearly screening should stop if you develop a health problem that would prevent you from having lung cancer treatment. Breast Cancer  Practice breast self-awareness. This means understanding how your breasts normally appear and feel.  It also means doing  regular breast self-exams. Let your health care provider know about any changes, no matter how small.  If you are in your 20s or 30s, you should have a clinical breast exam (CBE) by a health care provider every 1-3 years as part of a regular health exam.  If you are 32 or older, have a CBE every year. Also consider having a breast X-ray (mammogram) every year.  If you have a family history of breast cancer, talk to your health care provider about genetic screening.  If you are at high risk for breast cancer, talk to your health care provider about having an MRI and a mammogram every year.  Breast cancer gene (BRCA) assessment is recommended for women who have family members with BRCA-related cancers. BRCA-related cancers include:  Breast.  Ovarian.  Tubal.  Peritoneal cancers.  Results of the assessment will determine the need for genetic counseling and BRCA1 and BRCA2 testing. Cervical Cancer  Your health care provider may recommend that you be screened regularly for cancer of the pelvic organs (ovaries, uterus, and vagina). This screening involves a pelvic examination, including checking for microscopic changes to the surface of your cervix (Pap test). You may be encouraged to  have this screening done every 3 years, beginning at age 24.  For women ages 40-65, health care providers may recommend pelvic exams and Pap testing every 3 years, or they may recommend the Pap and pelvic exam, combined with testing for human papilloma virus (HPV), every 5 years. Some types of HPV increase your risk of cervical cancer. Testing for HPV may also be done on women of any age with unclear Pap test results.  Other health care providers may not recommend any screening for nonpregnant women who are considered low risk for pelvic cancer and who do not have symptoms. Ask your health care provider if a screening pelvic exam is right for you.  If you have had past treatment for cervical cancer or a condition  that could lead to cancer, you need Pap tests and screening for cancer for at least 20 years after your treatment. If Pap tests have been discontinued, your risk factors (such as having a new sexual partner) need to be reassessed to determine if screening should resume. Some women have medical problems that increase the chance of getting cervical cancer. In these cases, your health care provider may recommend more frequent screening and Pap tests. Colorectal Cancer  This type of cancer can be detected and often prevented.  Routine colorectal cancer screening usually begins at 75 years of age and continues through 75 years of age.  Your health care provider may recommend screening at an earlier age if you have risk factors for colon cancer.  Your health care provider may also recommend using home test kits to check for hidden blood in the stool.  A small camera at the end of a tube can be used to examine your colon directly (sigmoidoscopy or colonoscopy). This is done to check for the earliest forms of colorectal cancer.  Routine screening usually begins at age 4.  Direct examination of the colon should be repeated every 5-10 years through 75 years of age. However, you may need to be screened more often if early forms of precancerous polyps or small growths are found. Skin Cancer  Check your skin from head to toe regularly.  Tell your health care provider about any new moles or changes in moles, especially if there is a change in a mole's shape or color.  Also tell your health care provider if you have a mole that is larger than the size of a pencil eraser.  Always use sunscreen. Apply sunscreen liberally and repeatedly throughout the day.  Protect yourself by wearing long sleeves, pants, a wide-brimmed hat, and sunglasses whenever you are outside. Heart disease, diabetes, and high blood pressure  High blood pressure causes heart disease and increases the risk of stroke. High blood  pressure is more likely to develop in:  People who have blood pressure in the high end of the normal range (130-139/85-89 mm Hg).  People who are overweight or obese.  People who are African American.  If you are 51-60 years of age, have your blood pressure checked every 3-5 years. If you are 49 years of age or older, have your blood pressure checked every year. You should have your blood pressure measured twice-once when you are at a hospital or clinic, and once when you are not at a hospital or clinic. Record the average of the two measurements. To check your blood pressure when you are not at a hospital or clinic, you can use:  An automated blood pressure machine at a pharmacy.  A home blood  pressure monitor.  If you are between 13 years and 69 years old, ask your health care provider if you should take aspirin to prevent strokes.  Have regular diabetes screenings. This involves taking a blood sample to check your fasting blood sugar level.  If you are at a normal weight and have a low risk for diabetes, have this test once every three years after 75 years of age.  If you are overweight and have a high risk for diabetes, consider being tested at a younger age or more often. Preventing infection Hepatitis B  If you have a higher risk for hepatitis B, you should be screened for this virus. You are considered at high risk for hepatitis B if:  You were born in a country where hepatitis B is common. Ask your health care provider which countries are considered high risk.  Your parents were born in a high-risk country, and you have not been immunized against hepatitis B (hepatitis B vaccine).  You have HIV or AIDS.  You use needles to inject street drugs.  You live with someone who has hepatitis B.  You have had sex with someone who has hepatitis B.  You get hemodialysis treatment.  You take certain medicines for conditions, including cancer, organ transplantation, and autoimmune  conditions. Hepatitis C  Blood testing is recommended for:  Everyone born from [birth year] through 1964.  Anyone with known risk factors for hepatitis C. Sexually transmitted infections (STIs)  You should be screened for sexually transmitted infections (STIs) including gonorrhea and chlamydia if:  You are sexually active and are younger than 75 years of age.  You are older than 75 years of age and your health care provider tells you that you are at risk for this type of infection.  Your sexual activity has changed since you were last screened and you are at an increased risk for chlamydia or gonorrhea. Ask your health care provider if you are at risk.  If you do not have HIV, but are at risk, it may be recommended that you take a prescription medicine daily to prevent HIV infection. This is called pre-exposure prophylaxis (PrEP). You are considered at risk if:  You are sexually active and do not regularly use condoms or know the HIV status of your partner(s).  You take drugs by injection.  You are sexually active with a partner who has HIV. Talk with your health care provider about whether you are at high risk of being infected with HIV. If you choose to begin PrEP, you should first be tested for HIV. You should then be tested every 3 months for as long as you are taking PrEP. Pregnancy  If you are premenopausal and you may become pregnant, ask your health care provider about preconception counseling.  If you may become pregnant, take 400 to 800 micrograms (mcg) of folic acid every day.  If you want to prevent pregnancy, talk to your health care provider about birth control (contraception). Osteoporosis and menopause  Osteoporosis is a disease in which the bones lose minerals and strength with aging. This can result in serious bone fractures. Your risk for osteoporosis can be identified using a bone density scan.  If you are 43 years of age or older, or if you are at risk for  osteoporosis and fractures, ask your health care provider if you should be screened.  Ask your health care provider whether you should take a calcium or vitamin D supplement to lower your risk for osteoporosis.  Menopause may have certain physical symptoms and risks.  Hormone replacement therapy may reduce some of these symptoms and risks. Talk to your health care provider about whether hormone replacement therapy is right for you. Follow these instructions at home:  Schedule regular health, dental, and eye exams.  Stay current with your immunizations.  Do not use any tobacco products including cigarettes, chewing tobacco, or electronic cigarettes.  If you are pregnant, do not drink alcohol.  If you are breastfeeding, limit how much and how often you drink alcohol.  Limit alcohol intake to no more than 1 drink per day for nonpregnant women. One drink equals 12 ounces of beer, 5 ounces of wine, or 1 ounces of hard liquor.  Do not use street drugs.  Do not share needles.  Ask your health care provider for help if you need support or information about quitting drugs.  Tell your health care provider if you often feel depressed.  Tell your health care provider if you have ever been abused or do not feel safe at home. This information is not intended to replace advice given to you by your health care provider. Make sure you discuss any questions you have with your health care provider. Document Released: 12/09/2010 Document Revised: 11/01/2015 Document Reviewed: 02/27/2015  2017 Elsevier   Hearing Loss Introduction Hearing loss is a partial or total loss of the ability to hear. This can be temporary or permanent, and it can happen in one or both ears. Hearing loss may be referred to as deafness. Medical care is necessary to treat hearing loss properly and to prevent the condition from getting worse. Your hearing may partially or completely come back, depending on what caused your  hearing loss and how severe it is. In some cases, hearing loss is permanent. What are the causes? Common causes of hearing loss include:  Too much wax in the ear canal.  Infection of the ear canal or middle ear.  Fluid in the middle ear.  Injury to the ear or surrounding area.  An object stuck in the ear.  Prolonged exposure to loud sounds, such as music. Less common causes of hearing loss include:  Tumors in the ear.  Viral or bacterial infections, such as meningitis.  A hole in the eardrum (perforated eardrum).  Problems with the hearing nerve that sends signals between the brain and the ear.  Certain medicines. What are the signs or symptoms? Symptoms of this condition may include:  Difficulty telling the difference between sounds.  Difficulty following a conversation when there is background noise.  Lack of response to sounds in your environment. This may be most noticeable when you do not respond to startling sounds.  Needing to turn up the volume on the television, radio, etc.  Ringing in the ears.  Dizziness.  Pain in the ears. How is this diagnosed? This condition is diagnosed based on a physical exam and a hearing test (audiometry). The audiometry test will be performed by a hearing specialist (audiologist). You may also be referred to an ear, nose, and throat (ENT) specialist (otolaryngologist). How is this treated? Treatment for recent onset of hearing loss may include:  Ear wax removal.  Being prescribed medicines to prevent infection (antibiotics).  Being prescribed medicines to reduce inflammation (corticosteroids). Follow these instructions at home:  If you were prescribed an antibiotic medicine, take it as told by your health care provider. Do not stop taking the antibiotic even if you start to feel better.  Take over-the-counter  and prescription medicines only as told by your health care provider.  Avoid loud noises.  Return to your normal  activities as told by your health care provider. Ask your health care provider what activities are safe for you.  Keep all follow-up visits as told by your health care provider. This is important. Contact a health care provider if:  You feel dizzy.  You develop new symptoms.  You vomit or feel nauseous.  You have a fever. Get help right away if:  You develop sudden changes in your vision.  You have severe ear pain.  You have new or increased weakness.  You have a severe headache. This information is not intended to replace advice given to you by your health care provider. Make sure you discuss any questions you have with your health care provider. Document Released: 05/26/2005 Document Revised: 11/01/2015 Document Reviewed: 10/11/2014  2017 Elsevier   Mammogram A mammogram is an X-ray of the breasts that is done to check for abnormal changes. This procedure can screen for and detect any changes that may suggest breast cancer. A mammogram can also identify other changes and variations in the breast, such as:  Inflammation of the breast tissue (mastitis).  An infected area that contains a collection of pus (abscess).  A fluid-filled sac (cyst).  Fibrocystic changes. This is when breast tissue becomes denser, which can make the tissue feel rope-like or uneven under the skin.  Tumors that are not cancerous (benign). Tell a health care provider about:  Any allergies you have.  If you have breast implants.  If you have had previous breast disease, biopsy, or surgery.  If you are breastfeeding.  Any possibility that you could be pregnant, if this applies.  If you are younger than age 62.  If you have a family history of breast cancer. What are the risks? Generally, this is a safe procedure. However, problems may occur, including:  Exposure to radiation. Radiation levels are very low with this test.  The results being misinterpreted.  The need for further  tests.  The inability of the mammogram to detect certain cancers. What happens before the procedure?  Schedule your test about 1-2 weeks after your menstrual period. This is usually when your breasts are the least tender.  If you have had a mammogram done at a different facility in the past, get the mammogram X-rays or have them sent to your current exam facility in order to compare them.  Wash your breasts and under your arms the day of the test.  Do not wear deodorants, perfumes, lotions, or powders anywhere on your body on the day of the test.  Remove any jewelry from your neck.  Wear clothes that you can change into and out of easily. What happens during the procedure?  You will undress from the waist up and put on a gown.  You will stand in front of the X-ray machine.  Each breast will be placed between two plastic or glass plates. The plates will compress your breast for a few seconds. Try to stay as relaxed as possible during the procedure. This does not cause any harm to your breasts and any discomfort you feel will be very brief.  X-rays will be taken from different angles of each breast. The procedure may vary among health care providers and hospitals. What happens after the procedure?  The mammogram will be examined by a specialist (radiologist).  You may need to repeat certain parts of the test, depending  on the quality of the images. This is commonly done if the radiologist needs a better view of the breast tissue.  Ask when your test results will be ready. Make sure you get your test results.  You may resume your normal activities. This information is not intended to replace advice given to you by your health care provider. Make sure you discuss any questions you have with your health care provider. Document Released: 05/23/2000 Document Revised: 10/29/2015 Document Reviewed: 08/04/2014 Elsevier Interactive Patient Education  2017 [employer].

## 2016-07-02 ENCOUNTER — Other Ambulatory Visit: Payer: Self-pay | Admitting: Internal Medicine

## 2016-07-07 ENCOUNTER — Encounter: Payer: Self-pay | Admitting: Internal Medicine

## 2016-07-07 ENCOUNTER — Other Ambulatory Visit: Payer: Medicare Other | Admitting: Internal Medicine

## 2016-07-07 ENCOUNTER — Ambulatory Visit (INDEPENDENT_AMBULATORY_CARE_PROVIDER_SITE_OTHER): Payer: Medicare Other | Admitting: Internal Medicine

## 2016-07-07 VITALS — BP 146/70 | HR 72 | Temp 97.7°F | Ht 63.0 in | Wt 174.8 lb

## 2016-07-07 DIAGNOSIS — Z0000 Encounter for general adult medical examination without abnormal findings: Secondary | ICD-10-CM | POA: Diagnosis not present

## 2016-07-07 MED ORDER — VALACYCLOVIR HCL 500 MG PO TABS: 500.0000 mg | ORAL_TABLET | Freq: Two times a day (BID) | ORAL | 5 refills | Status: DC

## 2016-07-07 NOTE — Progress Notes (Signed)
Pre visit review using our clinic review tool, if applicable. No additional management support is needed unless otherwise documented below in the visit note. 

## 2016-07-07 NOTE — Patient Instructions (Signed)
Limit your sodium (Salt) intake   Please check your hemoglobin A1c every 6 months    It is important that you exercise regularly, at least 20 minutes 3 to 4 times per week.  If you develop chest pain or shortness of breath seek  medical attention.  You need to lose weight.  Consider a lower calorie diet and regular exercise.  Please see your eye doctor yearly to check for diabetic eye damage  Schedule your mammogram.

## 2016-07-07 NOTE — Progress Notes (Signed)
Subjective:    Patient ID: Stephanie Gonzalez, female    DOB: 1941-05-03, 76 y.o.   MRN: [id number]  HPI  76 year old patient who is seen today for her annual follow-up. She has a history of type 2 diabetes that has been well controlled with metformin therapy. She has essential hypertension and dyslipidemia. She has had a recent flare of herpes labialis. She's had a recent annual Medicare wellness visit.  She is scheduled for follow-up mammogram as well as a bone density study.  Her last colonoscopy was 2011.  Past Medical History:  Diagnosis Date  . ASTHMA 10/06/2007  . BURSITIS, RIGHT HIP 02/04/2010  . DIABETES MELLITUS, TYPE II 11/01/2009  . Family history of adverse reaction to anesthesia    Mother had n/v  . HYPERLIPIDEMIA 10/06/2007  . HYPERTENSION 10/06/2007  . Restless leg syndrome      Social History   Social History  . Marital status: Married    Spouse name: N/A  . Number of children: N/A  . Years of education: N/A   Occupational History  . Not on file.   Social History Main Topics  . Smoking status: Never Smoker  . Smokeless tobacco: Never Used  . Alcohol use Yes     Comment: rarely  . Drug use: No  . Sexual activity: Not on file   Other Topics Concern  . Not on file   Social History Narrative  . No narrative on file    Past Surgical History:  Procedure Laterality Date  . ABDOMINAL HYSTERECTOMY    . APPENDECTOMY    . CHOLECYSTECTOMY    . KNEE ARTHROSCOPY Right   . SPINAL CORD STIMULATOR INSERTION N/A 07/13/2014   Procedure: LUMBAR SPINAL CORD STIMULATOR INSERTION;  Surgeon: Venita Lick, MD;  Location: [facility] OR;  Service: Orthopedics;  Laterality: N/A;  . TONSILLECTOMY    . TUBAL LIGATION      No family history on file.  Allergies  Allergen Reactions  . Tetracycline     REACTION: rash, agitation    Current Outpatient Prescriptions on File Prior to Visit  Medication Sig Dispense Refill  . albuterol (PROAIR HFA) 108 (90 BASE) MCG/ACT inhaler Inhale  2 puffs into the lungs every 6 (six) hours as needed for wheezing.     Marland Kitchen amLODipine (NORVASC) 5 MG tablet TAKE 1 TABLET BY MOUTH  DAILY 90 tablet 1  . atorvastatin (LIPITOR) 40 MG tablet Take 1 tablet by mouth  daily 90 tablet 1  . b complex vitamins tablet Take 1 tablet by mouth daily.    . clonazePAM (KLONOPIN) 1 MG tablet TAKE 1 TABLET BY MOUTH T BEDTIME AS NEEDED 90 tablet 0  . glucose blood (FREESTYLE LITE) test strip USE TO CHECK BLOOD SUGAR DAILY AND PRN 100 each 12  . HYDROcodone-acetaminophen (NORCO) 10-325 MG tablet Take 0.5 tablets by mouth 2 (two) times daily as needed for moderate pain. 30 tablet 0  . Lancets (FREESTYLE) lancets USE TO CHECK BLOOD SUGAR DAILY AND PRN 100 each 12  . LYSINE PO Take 1 tablet by mouth daily as needed (cold sores).     . meclizine (ANTIVERT) 25 MG tablet Take 1 tablet (25 mg total) by mouth 3 (three) times daily as needed for dizziness. 30 tablet 0  . metFORMIN (GLUCOPHAGE) 500 MG tablet Take 1 tablet by mouth  twice a day with meals 180 tablet 1  . prednisoLONE acetate (PRED FORTE) 1 % ophthalmic suspension INSTILL 1 DROP INTO BOTH EYES 4 TIMES  A DAY  0  . sertraline (ZOLOFT) 100 MG tablet Take one-half tablet by  mouth daily 45 tablet 0  . valACYclovir (VALTREX) 500 MG tablet TAKE 1 TABLET (500 MG TOTAL) BY MOUTH DAILY. 7 tablet 5  . HYDROcodone-homatropine (HYCODAN) 5-1.5 MG/5ML syrup Take 5 mLs by mouth every 6 (six) hours as needed for cough. (Patient not taking: Reported on 07/07/2016) 120 mL 0   No current facility-administered medications on file prior to visit.     BP (!) 146/70 (BP Location: Right Arm, Patient Position: Sitting, Cuff Size: Normal)   Pulse 72   Temp 97.7 F (36.5 C) (Oral)   Ht 5\' 3"  (1.6 m)   Wt 174 lb 12.8 oz (79.3 kg)   SpO2 98%   BMI 30.96 kg/m    Review of Systems  Constitutional: Negative.   HENT: Negative for congestion, dental problem, hearing loss, rhinorrhea, sinus pressure, sore throat and tinnitus.   Eyes:  Negative for pain, discharge and visual disturbance.  Respiratory: Negative for cough and shortness of breath.   Cardiovascular: Negative for chest pain, palpitations and leg swelling.  Gastrointestinal: Negative for abdominal distention, abdominal pain, blood in stool, constipation, diarrhea, nausea and vomiting.  Genitourinary: Negative for difficulty urinating, dysuria, flank pain, frequency, hematuria, pelvic pain, urgency, vaginal bleeding, vaginal discharge and vaginal pain.  Musculoskeletal: Negative for arthralgias, gait problem and joint swelling.  Skin: Positive for rash.  Neurological: Negative for dizziness, syncope, speech difficulty, weakness, numbness and headaches.  Hematological: Negative for adenopathy.  Psychiatric/Behavioral: Negative for agitation, behavioral problems and dysphoric mood. The patient is not nervous/anxious.        Objective:   Physical Exam  Constitutional: She is oriented to person, place, and time. She appears well-developed and well-nourished.  HENT:  Head: Normocephalic.  Right Ear: External ear normal.  Left Ear: External ear normal.  Mouth/Throat: Oropharynx is clear and moist.  Eyes: Conjunctivae and EOM are normal. Pupils are equal, round, and reactive to light.  Neck: Normal range of motion. Neck supple. No thyromegaly present.  Cardiovascular: Normal rate, regular rhythm, normal heart sounds and intact distal pulses.   Pulmonary/Chest: Effort normal and breath sounds normal.  Abdominal: Soft. Bowel sounds are normal. She exhibits no mass. There is no tenderness.  Musculoskeletal: Normal range of motion.  Lymphadenopathy:    She has no cervical adenopathy.  Neurological: She is alert and oriented to person, place, and time.  Skin: Skin is warm and dry. No rash noted.  Resolving herpetic lesions.  Upper lip Scattered areas of resolving folliculitis anterior lower legs  Psychiatric: She has a normal mood and affect. Her behavior is normal.            Assessment & Plan:   Preventive health examination Diabetes mellitus.  Well-controlled.  No change in therapy.  Weight loss encouraged Essential hypertension, stable Dyslipidemia.  Continue statin therapy History of asthma, stable  Bone density, mammogram as scheduled Follow-up here 6 months Laboratory studies reviewed  Rogelia BogaKWIATKOWSKI,Donnae Michels FRANK \

## 2016-07-14 ENCOUNTER — Other Ambulatory Visit: Payer: Self-pay | Admitting: Internal Medicine

## 2016-07-14 DIAGNOSIS — Z1231 Encounter for screening mammogram for malignant neoplasm of breast: Secondary | ICD-10-CM

## 2016-07-25 ENCOUNTER — Ambulatory Visit
Admission: RE | Admit: 2016-07-25 | Discharge: 2016-07-25 | Disposition: A | Discharge status: Home / Self Care | Payer: Medicare Other | Source: Ambulatory Visit | Attending: Internal Medicine | Admitting: Internal Medicine

## 2016-07-25 DIAGNOSIS — Z780 Asymptomatic menopausal state: Secondary | ICD-10-CM

## 2016-07-25 DIAGNOSIS — Z1231 Encounter for screening mammogram for malignant neoplasm of breast: Secondary | ICD-10-CM

## 2016-09-29 ENCOUNTER — Other Ambulatory Visit: Payer: Self-pay | Admitting: Internal Medicine

## 2016-11-05 ENCOUNTER — Other Ambulatory Visit: Payer: Self-pay | Admitting: Physical Medicine and Rehabilitation

## 2016-11-05 DIAGNOSIS — M48062 Spinal stenosis, lumbar region with neurogenic claudication: Secondary | ICD-10-CM

## 2016-11-10 ENCOUNTER — Encounter: Payer: Self-pay | Admitting: Family Medicine

## 2016-11-13 ENCOUNTER — Other Ambulatory Visit: Payer: Self-pay | Admitting: Physical Medicine and Rehabilitation

## 2016-11-13 ENCOUNTER — Ambulatory Visit
Admission: RE | Admit: 2016-11-13 | Discharge: 2016-11-13 | Disposition: A | Discharge status: Home / Self Care | Payer: Medicare Other | Source: Ambulatory Visit | Attending: Physical Medicine and Rehabilitation | Admitting: Physical Medicine and Rehabilitation

## 2016-11-13 ENCOUNTER — Ambulatory Visit
Admission: RE | Admit: 2016-11-13 | Discharge: 2016-11-13 | Disposition: A | Discharge status: Home / Self Care | Payer: Self-pay | Source: Ambulatory Visit | Attending: Physical Medicine and Rehabilitation | Admitting: Physical Medicine and Rehabilitation

## 2016-11-13 DIAGNOSIS — M48062 Spinal stenosis, lumbar region with neurogenic claudication: Secondary | ICD-10-CM

## 2016-11-13 MED ORDER — ONDANSETRON HCL 4 MG/2ML IJ SOLN: 4.0000 mg | Freq: Four times a day (QID) | INTRAMUSCULAR | Status: DC | PRN

## 2016-11-13 MED ORDER — IOPAMIDOL (ISOVUE-M 200) INJECTION 41%
10.0000 mL | Freq: Once | INTRAMUSCULAR | Status: AC
  Administered 2016-11-13: 10 mL via INTRATHECAL

## 2016-11-13 MED ORDER — DIAZEPAM 5 MG PO TABS
5.0000 mg | ORAL_TABLET | Freq: Once | ORAL | Status: AC
  Administered 2016-11-13: 5 mg via ORAL

## 2016-11-13 NOTE — Discharge Instructions (Signed)

## 2016-12-15 ENCOUNTER — Other Ambulatory Visit: Payer: Self-pay | Admitting: Neurological Surgery

## 2016-12-21 ENCOUNTER — Other Ambulatory Visit: Payer: Self-pay | Admitting: Internal Medicine

## 2016-12-24 ENCOUNTER — Other Ambulatory Visit (HOSPITAL_COMMUNITY): Payer: Self-pay | Admitting: *Deleted

## 2016-12-24 ENCOUNTER — Other Ambulatory Visit: Payer: Self-pay

## 2016-12-24 ENCOUNTER — Encounter (HOSPITAL_COMMUNITY)
Admission: RE | Admit: 2016-12-24 | Discharge: 2016-12-24 | Disposition: A | Discharge status: Home / Self Care | Payer: Medicare Other | Source: Ambulatory Visit | Attending: Neurological Surgery | Admitting: Neurological Surgery

## 2016-12-24 ENCOUNTER — Telehealth: Payer: Self-pay

## 2016-12-24 ENCOUNTER — Ambulatory Visit (HOSPITAL_COMMUNITY)
Admission: RE | Admit: 2016-12-24 | Discharge: 2016-12-24 | Disposition: A | Discharge status: Home / Self Care | Payer: Medicare Other | Source: Ambulatory Visit | Attending: Neurological Surgery | Admitting: Neurological Surgery

## 2016-12-24 ENCOUNTER — Encounter (HOSPITAL_COMMUNITY): Payer: Self-pay

## 2016-12-24 DIAGNOSIS — Z01818 Encounter for other preprocedural examination: Secondary | ICD-10-CM

## 2016-12-24 DIAGNOSIS — Z01812 Encounter for preprocedural laboratory examination: Secondary | ICD-10-CM

## 2016-12-24 DIAGNOSIS — Z0183 Encounter for blood typing: Secondary | ICD-10-CM

## 2016-12-24 DIAGNOSIS — M4310 Spondylolisthesis, site unspecified: Secondary | ICD-10-CM

## 2016-12-24 HISTORY — DX: Personal history of urinary calculi: Z87.442

## 2016-12-24 HISTORY — DX: Unspecified asthma, uncomplicated: J45.909

## 2016-12-24 HISTORY — DX: Personal history of other specified conditions: Z87.898

## 2016-12-24 HISTORY — DX: Pneumonia, unspecified organism: J18.9

## 2016-12-24 LAB — ABO/RH: ABO/RH(D): O POS

## 2016-12-24 LAB — BASIC METABOLIC PANEL
ANION GAP: 5 (ref 5–15)
BUN: 18 mg/dL (ref 6–20)
CALCIUM: 9.7 mg/dL (ref 8.9–10.3)
CHLORIDE: 106 mmol/L (ref 101–111)
CO2: 28 mmol/L (ref 22–32)
Creatinine, Ser: 0.78 mg/dL (ref 0.44–1.00)
GFR calc non Af Amer: >60 mL/min (ref >60)
GLUCOSE: 100 mg/dL — AB (ref 65–99)
POTASSIUM: 3.7 mmol/L (ref 3.5–5.1)
Sodium: 139 mmol/L (ref 135–145)

## 2016-12-24 LAB — CBC WITH DIFFERENTIAL/PLATELET
Basophils Absolute: 0 10*3/uL (ref 0.0–0.1)
Basophils Relative: 1 %
Eosinophils Absolute: 0.1 10*3/uL (ref 0.0–0.7)
Eosinophils Relative: 1 %
HEMATOCRIT: 38.8 % (ref 36.0–46.0)
HEMOGLOBIN: 13 g/dL (ref 12.0–15.0)
Lymphocytes Relative: 46 %
Lymphs Abs: 3.5 10*3/uL (ref 0.7–4.0)
MCH: 28.2 pg (ref 26.0–34.0)
MCHC: 33.5 g/dL (ref 30.0–36.0)
MCV: 84.2 fL (ref 78.0–100.0)
MONO ABS: 0.5 10*3/uL (ref 0.1–1.0)
MONOS PCT: 6 %
NEUTROS ABS: 3.4 10*3/uL (ref 1.7–7.7)
NEUTROS PCT: 46 %
Platelets: 217 10*3/uL (ref 150–400)
RBC: 4.61 MIL/uL (ref 3.87–5.11)
RDW: 13.3 % (ref 11.5–15.5)
WBC: 7.5 10*3/uL (ref 4.0–10.5)

## 2016-12-24 LAB — SURGICAL PCR SCREEN
MRSA, PCR: POSITIVE — AB
STAPHYLOCOCCUS AUREUS: POSITIVE — AB

## 2016-12-24 LAB — GLUCOSE, CAPILLARY: Glucose-Capillary: 92 mg/dL (ref 65–99)

## 2016-12-24 LAB — PROTIME-INR
INR: 0.95
Prothrombin Time: 12.7 seconds (ref 11.4–15.2)

## 2016-12-24 LAB — TYPE AND SCREEN
ABO/RH(D): O POS
Antibody Screen: NEGATIVE

## 2016-12-24 NOTE — Pre-Procedure Instructions (Addendum)
Alfonso RamusSue M Vicens  12/24/2016    Your procedure is scheduled on Thursday, December 25, 2016 at 7:30 AM.   [facility] at 5:30 AM.   Call this number if you have problems the morning of surgery: [phone]   Remember:  Do not eat food or drink liquids after midnight tonight.  Take these medicines the morning of surgery with A SIP OF WATER: Amlodipine (Norvasc), Valacyclovir (Valtrex), Hydrocodone - if needed, Albuterol inhaler - if needed (bring with you in the AM)    How to Manage Your Diabetes Before Surgery   Why is it important to control my blood sugar before and after surgery?   Improving blood sugar levels before and after surgery helps healing and can limit problems.  A way of improving blood sugar control is eating a healthy diet by:  - Eating less sugar and carbohydrates  - Increasing activity/exercise  - Talk with your doctor about reaching your blood sugar goals  High blood sugars (greater than 180 mg/dL) can raise your risk of infections and slow down your recovery so you will need to focus on controlling your diabetes during the weeks before surgery.  Make sure that the doctor who takes care of your diabetes knows about your planned surgery including the date and location.  How do I manage my blood sugars before surgery?   Check your blood sugar at least 4 times a day, 2 days before surgery to make sure that they are not too high or low.  Check your blood sugar the morning of your surgery when you wake up and every 2 hours until you get to the Short-Stay unit.  Treat a low blood sugar (less than 70 mg/dL) with 1/2 cup of clear juice (cranberry or apple), 4 glucose tablets, OR glucose gel.  Recheck blood sugar in 15 minutes after treatment (to make sure it is greater than 70 mg/dL).  If blood sugar is not greater than 70 mg/dL on re-check, call [phone] for further instructions.   Report your blood sugar to the  Short-Stay nurse when you get to Short-Stay.  References:  [facility], 2007 "How to Manage your Diabetes Before and After Surgery".  What do I do about my diabetes medications?   Do not take oral diabetes medicines (pills) the morning of surgery.   Do not wear jewelry, make-up or nail polish.  Do not wear lotions, powders, perfumes or deodorant.  Do not shave 48 hours prior to surgery.    Do not bring valuables to the hospital.  [facility] is not [facility] for any belongings or valuables.  Contacts, dentures or bridgework may not be worn into surgery.  Leave your suitcase in the car.  After surgery it may be brought to your room.  For patients admitted to the hospital, discharge time will be determined by your treatment team.  Special instructions:  [facility] - Preparing for Surgery  Before surgery, you can play an important role.  Because skin is not sterile, your skin needs to be as free of germs as possible.  You can reduce the number of germs on you skin by washing with CHG (chlorahexidine gluconate) soap before surgery.  CHG is an antiseptic cleaner which kills germs and bonds with the skin to continue killing germs even after washing.  Please DO NOT use if you have an allergy to CHG or antibacterial soaps.  If your skin becomes reddened/irritated stop  using the CHG and inform your nurse when you arrive at Short Stay.  Do not shave (including legs and underarms) for at least 48 hours prior to the first CHG shower.  You may shave your face.  Please follow these instructions carefully:   1.  Shower with CHG Soap the night before surgery and the                    morning of Surgery.  2.  If you choose to wash your hair, wash your hair first as usual with your       normal shampoo.  3.  After you shampoo, rinse your hair and body thoroughly to remove the shampoo.  4.  Use CHG as you would any other liquid soap.  You can apply chg directly       to  the skin and wash gently with scrungie or a clean washcloth.  5.  Apply the CHG Soap to your body ONLY FROM THE NECK DOWN.        Do not use on open wounds or open sores.  Avoid contact with your eyes, ears, mouth and genitals (private parts).  Wash genitals (private parts) with your normal soap.  6.  Wash thoroughly, paying special attention to the area where your surgery        will be performed.  7.  Thoroughly rinse your body with warm water from the neck down.  8.  DO NOT shower/wash with your normal soap after using and rinsing off       the CHG Soap.  9.  Pat yourself dry with a clean towel.            10.  Wear clean pajamas.            11.  Place clean sheets on your bed the night of your first shower and do not        sleep with pets.  Day of Surgery  Do not apply any lotions/deodorants the morning of surgery.  Please wear clean clothes to the hospital.   Please read over the fact sheets that you were given.

## 2016-12-24 NOTE — Telephone Encounter (Signed)
[facility] faxed refill request for Zoloft and Metformin.   Per chart pt has not had Zoloft prescribed >1 year.   Per chart Metformin was last prescribed 10/25/15, #180, 1 refill. Based on this information pt has possibly not been taking medication as directed.   Please advise on refills. Thanks!

## 2016-12-24 NOTE — Progress Notes (Signed)
Pt denies cardiac history or chest pain. States she has sob associated with allergies, bronchial asthma. Pt is type 2 Diabetic and on Metformin only. Pt's last A1C was 5.1 in January, 2018. Pt states her fasting blood sugar is usually around 100. Dr. Frederica KusterKwaitkowski is pt's PCP.

## 2016-12-24 NOTE — Progress Notes (Signed)
Pt had a stress test done in 1997 (in [facility]) and in notes from that it states that pt had a normal cath in 1992. Pt states that she has only had 1 cath done, but couldn't remember exactly when.

## 2016-12-25 ENCOUNTER — Inpatient Hospital Stay (HOSPITAL_COMMUNITY): Payer: Medicare Other | Admitting: Anesthesiology

## 2016-12-25 ENCOUNTER — Inpatient Hospital Stay (HOSPITAL_COMMUNITY): Payer: Medicare Other

## 2016-12-25 ENCOUNTER — Encounter (HOSPITAL_COMMUNITY)
Admission: RE | Disposition: A | Discharge status: Home / Self Care | Payer: Self-pay | Source: Ambulatory Visit | Attending: Neurological Surgery

## 2016-12-25 ENCOUNTER — Encounter (HOSPITAL_COMMUNITY): Payer: Self-pay | Admitting: Anesthesiology

## 2016-12-25 ENCOUNTER — Inpatient Hospital Stay (HOSPITAL_COMMUNITY)
Admission: RE | Admit: 2016-12-25 | Discharge: 2016-12-27 | DRG: 455 | Disposition: A | Discharge status: Home / Self Care | Payer: Medicare Other | Source: Ambulatory Visit | Attending: Neurological Surgery | Admitting: Neurological Surgery

## 2016-12-25 DIAGNOSIS — Z7984 Long term (current) use of oral hypoglycemic drugs: Secondary | ICD-10-CM | POA: Diagnosis not present

## 2016-12-25 DIAGNOSIS — G2581 Restless legs syndrome: Secondary | ICD-10-CM | POA: Diagnosis present

## 2016-12-25 DIAGNOSIS — M4316 Spondylolisthesis, lumbar region: Secondary | ICD-10-CM | POA: Diagnosis present

## 2016-12-25 DIAGNOSIS — Z419 Encounter for procedure for purposes other than remedying health state, unspecified: Secondary | ICD-10-CM

## 2016-12-25 DIAGNOSIS — M48061 Spinal stenosis, lumbar region without neurogenic claudication: Principal | ICD-10-CM | POA: Diagnosis present

## 2016-12-25 DIAGNOSIS — Z981 Arthrodesis status: Secondary | ICD-10-CM

## 2016-12-25 DIAGNOSIS — E119 Type 2 diabetes mellitus without complications: Secondary | ICD-10-CM | POA: Diagnosis present

## 2016-12-25 DIAGNOSIS — M5416 Radiculopathy, lumbar region: Secondary | ICD-10-CM | POA: Diagnosis present

## 2016-12-25 DIAGNOSIS — M419 Scoliosis, unspecified: Secondary | ICD-10-CM | POA: Diagnosis present

## 2016-12-25 DIAGNOSIS — J45909 Unspecified asthma, uncomplicated: Secondary | ICD-10-CM | POA: Diagnosis present

## 2016-12-25 DIAGNOSIS — E785 Hyperlipidemia, unspecified: Secondary | ICD-10-CM | POA: Diagnosis present

## 2016-12-25 DIAGNOSIS — I10 Essential (primary) hypertension: Secondary | ICD-10-CM | POA: Diagnosis present

## 2016-12-25 LAB — GLUCOSE, CAPILLARY
GLUCOSE-CAPILLARY: 139 mg/dL — AB (ref 65–99)
Glucose-Capillary: 101 mg/dL — ABNORMAL HIGH (ref 65–99)
Glucose-Capillary: 115 mg/dL — ABNORMAL HIGH (ref 65–99)
Glucose-Capillary: 115 mg/dL — ABNORMAL HIGH (ref 65–99)

## 2016-12-25 LAB — HEMOGLOBIN A1C
HEMOGLOBIN A1C: 5.1 % (ref 4.8–5.6)
Mean Plasma Glucose: 100 mg/dL

## 2016-12-25 SURGERY — POSTERIOR LUMBAR FUSION 2 LEVEL
Anesthesia: General | Site: Back

## 2016-12-25 MED ORDER — CEFAZOLIN SODIUM-DEXTROSE 2-4 GM/100ML-% IV SOLN
2.0000 g | INTRAVENOUS | Status: AC
  Administered 2016-12-25: 2 g via INTRAVENOUS

## 2016-12-25 MED ORDER — CEFAZOLIN SODIUM-DEXTROSE 2-4 GM/100ML-% IV SOLN
2.0000 g | Freq: Three times a day (TID) | INTRAVENOUS | Status: AC
  Administered 2016-12-25 – 2016-12-26 (×2): 2 g via INTRAVENOUS
  Filled 2016-12-25 (×2): qty 100

## 2016-12-25 MED ORDER — INSULIN ASPART 100 UNIT/ML ~~LOC~~ SOLN: 0.0000 [IU] | Freq: Three times a day (TID) | SUBCUTANEOUS | Status: DC

## 2016-12-25 MED ORDER — EPHEDRINE 5 MG/ML INJ
INTRAVENOUS | Status: AC
  Filled 2016-12-25: qty 10

## 2016-12-25 MED ORDER — DEXAMETHASONE SODIUM PHOSPHATE 10 MG/ML IJ SOLN
INTRAMUSCULAR | Status: AC
  Filled 2016-12-25: qty 1

## 2016-12-25 MED ORDER — CHLORHEXIDINE GLUCONATE CLOTH 2 % EX PADS: 6.0000 | MEDICATED_PAD | Freq: Once | CUTANEOUS | Status: DC

## 2016-12-25 MED ORDER — SODIUM CHLORIDE 0.9 % IV SOLN: 250.0000 mL | INTRAVENOUS | Status: DC

## 2016-12-25 MED ORDER — OXYCODONE HCL 5 MG PO TABS
ORAL_TABLET | ORAL | Status: AC
  Administered 2016-12-25: 5 mg via ORAL
  Filled 2016-12-25: qty 1

## 2016-12-25 MED ORDER — MEPERIDINE HCL 25 MG/ML IJ SOLN: 6.2500 mg | INTRAMUSCULAR | Status: DC | PRN

## 2016-12-25 MED ORDER — SODIUM CHLORIDE 0.9% FLUSH: 3.0000 mL | INTRAVENOUS | Status: DC | PRN

## 2016-12-25 MED ORDER — FENTANYL CITRATE (PF) 100 MCG/2ML IJ SOLN
INTRAMUSCULAR | Status: DC | PRN
  Administered 2016-12-25 (×5): 50 ug via INTRAVENOUS

## 2016-12-25 MED ORDER — HYDROMORPHONE HCL 1 MG/ML IJ SOLN
INTRAMUSCULAR | Status: AC
  Administered 2016-12-25: 0.5 mg via INTRAVENOUS
  Filled 2016-12-25: qty 1

## 2016-12-25 MED ORDER — SURGIFOAM 100 EX MISC
CUTANEOUS | Status: DC | PRN
  Administered 2016-12-25: 09:00:00 via TOPICAL

## 2016-12-25 MED ORDER — LIDOCAINE HCL (CARDIAC) 20 MG/ML IV SOLN
INTRAVENOUS | Status: AC
  Filled 2016-12-25: qty 5

## 2016-12-25 MED ORDER — CLONAZEPAM 1 MG PO TABS
1.0000 mg | ORAL_TABLET | Freq: Every day | ORAL | Status: DC
  Administered 2016-12-25 – 2016-12-26 (×2): 1 mg via ORAL
  Filled 2016-12-25 (×2): qty 1

## 2016-12-25 MED ORDER — THROMBIN 5000 UNITS EX SOLR
CUTANEOUS | Status: DC | PRN
Start: 2016-12-25 — End: 2016-12-25
  Administered 2016-12-25: 09:00:00 via TOPICAL

## 2016-12-25 MED ORDER — METFORMIN HCL 500 MG PO TABS
500.0000 mg | ORAL_TABLET | Freq: Two times a day (BID) | ORAL | Status: DC
  Administered 2016-12-25 – 2016-12-27 (×4): 500 mg via ORAL
  Filled 2016-12-25 (×4): qty 1

## 2016-12-25 MED ORDER — MUPIROCIN 2 % EX OINT
TOPICAL_OINTMENT | CUTANEOUS | Status: AC
  Administered 2016-12-25: 1 via TOPICAL
  Filled 2016-12-25: qty 22

## 2016-12-25 MED ORDER — ONDANSETRON HCL 4 MG/2ML IJ SOLN
INTRAMUSCULAR | Status: AC
  Filled 2016-12-25: qty 2

## 2016-12-25 MED ORDER — ROCURONIUM BROMIDE 100 MG/10ML IV SOLN
INTRAVENOUS | Status: DC | PRN
  Administered 2016-12-25: 40 mg via INTRAVENOUS
  Administered 2016-12-25 (×5): 10 mg via INTRAVENOUS

## 2016-12-25 MED ORDER — ACETAMINOPHEN 650 MG RE SUPP: 650.0000 mg | RECTAL | Status: DC | PRN

## 2016-12-25 MED ORDER — METHOCARBAMOL 500 MG PO TABS
ORAL_TABLET | ORAL | Status: AC
  Administered 2016-12-25: 500 mg via ORAL
  Filled 2016-12-25: qty 1

## 2016-12-25 MED ORDER — MENTHOL 3 MG MT LOZG: 1.0000 | LOZENGE | OROMUCOSAL | Status: DC | PRN

## 2016-12-25 MED ORDER — DEXTROSE 5 % IV SOLN: INTRAVENOUS | Status: DC | PRN

## 2016-12-25 MED ORDER — PROPOFOL 10 MG/ML IV BOLUS
INTRAVENOUS | Status: DC | PRN
  Administered 2016-12-25: 150 mg via INTRAVENOUS
  Administered 2016-12-25: 50 mg via INTRAVENOUS

## 2016-12-25 MED ORDER — ONDANSETRON HCL 4 MG/2ML IJ SOLN: 4.0000 mg | Freq: Four times a day (QID) | INTRAMUSCULAR | Status: DC | PRN

## 2016-12-25 MED ORDER — BUPIVACAINE HCL (PF) 0.25 % IJ SOLN
INTRAMUSCULAR | Status: DC | PRN
  Administered 2016-12-25: 8 mL

## 2016-12-25 MED ORDER — ONDANSETRON HCL 4 MG/2ML IJ SOLN
INTRAMUSCULAR | Status: DC | PRN
  Administered 2016-12-25: 4 mg via INTRAVENOUS

## 2016-12-25 MED ORDER — PHENOL 1.4 % MT LIQD
1.0000 | OROMUCOSAL | Status: DC | PRN
Start: 2016-12-25 — End: 2016-12-27

## 2016-12-25 MED ORDER — VANCOMYCIN HCL 1000 MG IV SOLR
INTRAVENOUS | Status: DC | PRN
  Administered 2016-12-25: 1000 mg

## 2016-12-25 MED ORDER — PHENYLEPHRINE HCL 10 MG/ML IJ SOLN
INTRAVENOUS | Status: DC | PRN
  Administered 2016-12-25: 15 ug/min via INTRAVENOUS

## 2016-12-25 MED ORDER — METHOCARBAMOL 1000 MG/10ML IJ SOLN
500.0000 mg | Freq: Four times a day (QID) | INTRAMUSCULAR | Status: DC | PRN
  Filled 2016-12-25: qty 5

## 2016-12-25 MED ORDER — SODIUM CHLORIDE 0.9% FLUSH
3.0000 mL | Freq: Two times a day (BID) | INTRAVENOUS | Status: DC
  Administered 2016-12-25 – 2016-12-26 (×2): 3 mL via INTRAVENOUS

## 2016-12-25 MED ORDER — GLYCOPYRROLATE 0.2 MG/ML IJ SOLN
INTRAMUSCULAR | Status: DC | PRN
  Administered 2016-12-25: .2 mg via INTRAVENOUS

## 2016-12-25 MED ORDER — ONDANSETRON HCL 4 MG PO TABS: 4.0000 mg | ORAL_TABLET | Freq: Four times a day (QID) | ORAL | Status: DC | PRN

## 2016-12-25 MED ORDER — BUPIVACAINE HCL (PF) 0.25 % IJ SOLN
INTRAMUSCULAR | Status: AC
  Filled 2016-12-25: qty 30

## 2016-12-25 MED ORDER — HYDROMORPHONE HCL 1 MG/ML IJ SOLN
INTRAMUSCULAR | Status: AC
Start: 2016-12-25 — End: 2016-12-25
  Administered 2016-12-25: 0.5 mg via INTRAVENOUS
  Filled 2016-12-25: qty 1

## 2016-12-25 MED ORDER — 0.9 % SODIUM CHLORIDE (POUR BTL) OPTIME
TOPICAL | Status: DC | PRN
Start: 2016-12-25 — End: 2016-12-25
  Administered 2016-12-25: 1000 mL

## 2016-12-25 MED ORDER — ALBUTEROL SULFATE (2.5 MG/3ML) 0.083% IN NEBU: 3.0000 mL | INHALATION_SOLUTION | Freq: Four times a day (QID) | RESPIRATORY_TRACT | Status: DC | PRN

## 2016-12-25 MED ORDER — AMLODIPINE BESYLATE 5 MG PO TABS
5.0000 mg | ORAL_TABLET | Freq: Every day | ORAL | Status: DC
  Administered 2016-12-26 – 2016-12-27 (×2): 5 mg via ORAL
  Filled 2016-12-25 (×2): qty 1

## 2016-12-25 MED ORDER — VANCOMYCIN HCL 1000 MG IV SOLR
INTRAVENOUS | Status: AC
  Filled 2016-12-25: qty 1000

## 2016-12-25 MED ORDER — LIDOCAINE HCL (CARDIAC) 20 MG/ML IV SOLN
INTRAVENOUS | Status: DC | PRN
  Administered 2016-12-25: 50 mg via INTRAVENOUS

## 2016-12-25 MED ORDER — ACETAMINOPHEN 325 MG PO TABS: 650.0000 mg | ORAL_TABLET | ORAL | Status: DC | PRN

## 2016-12-25 MED ORDER — LACTATED RINGERS IV SOLN
INTRAVENOUS | Status: DC
  Administered 2016-12-25: 14:00:00 via INTRAVENOUS

## 2016-12-25 MED ORDER — MUPIROCIN 2 % EX OINT
1.0000 "application " | TOPICAL_OINTMENT | Freq: Once | CUTANEOUS | Status: AC
  Administered 2016-12-25: 1 via TOPICAL

## 2016-12-25 MED ORDER — MORPHINE SULFATE (PF) 2 MG/ML IV SOLN
2.0000 mg | INTRAVENOUS | Status: DC | PRN
  Administered 2016-12-26: 2 mg via INTRAVENOUS
  Filled 2016-12-25: qty 1

## 2016-12-25 MED ORDER — ROCURONIUM BROMIDE 50 MG/5ML IV SOLN
INTRAVENOUS | Status: AC
  Filled 2016-12-25: qty 2

## 2016-12-25 MED ORDER — METHOCARBAMOL 500 MG PO TABS
500.0000 mg | ORAL_TABLET | Freq: Four times a day (QID) | ORAL | Status: DC | PRN
  Administered 2016-12-25 – 2016-12-27 (×5): 500 mg via ORAL
  Filled 2016-12-25 (×4): qty 1

## 2016-12-25 MED ORDER — THROMBIN 20000 UNITS EX SOLR
CUTANEOUS | Status: AC
  Filled 2016-12-25: qty 20000

## 2016-12-25 MED ORDER — CEFAZOLIN SODIUM-DEXTROSE 2-4 GM/100ML-% IV SOLN
INTRAVENOUS | Status: AC
  Filled 2016-12-25: qty 100

## 2016-12-25 MED ORDER — POTASSIUM CHLORIDE IN NACL 20-0.9 MEQ/L-% IV SOLN
INTRAVENOUS | Status: DC
  Administered 2016-12-25: 16:00:00 via INTRAVENOUS
  Filled 2016-12-25 (×2): qty 1000

## 2016-12-25 MED ORDER — THROMBIN 5000 UNITS EX SOLR
CUTANEOUS | Status: AC
  Filled 2016-12-25: qty 5000

## 2016-12-25 MED ORDER — SENNA 8.6 MG PO TABS
1.0000 | ORAL_TABLET | Freq: Two times a day (BID) | ORAL | Status: DC
Start: 2016-12-25 — End: 2016-12-27
  Administered 2016-12-25 – 2016-12-27 (×4): 8.6 mg via ORAL
  Filled 2016-12-25 (×4): qty 1

## 2016-12-25 MED ORDER — DEXAMETHASONE SODIUM PHOSPHATE 10 MG/ML IJ SOLN: 10.0000 mg | INTRAMUSCULAR | Status: DC

## 2016-12-25 MED ORDER — MIDAZOLAM HCL 2 MG/2ML IJ SOLN
INTRAMUSCULAR | Status: AC
  Filled 2016-12-25: qty 2

## 2016-12-25 MED ORDER — EPHEDRINE SULFATE 50 MG/ML IJ SOLN
INTRAMUSCULAR | Status: DC | PRN
  Administered 2016-12-25 (×2): 5 mg via INTRAVENOUS

## 2016-12-25 MED ORDER — PROPOFOL 10 MG/ML IV BOLUS
INTRAVENOUS | Status: AC
  Filled 2016-12-25: qty 40

## 2016-12-25 MED ORDER — HYDROMORPHONE HCL 1 MG/ML IJ SOLN
0.2500 mg | INTRAMUSCULAR | Status: DC | PRN
  Administered 2016-12-25 (×4): 0.5 mg via INTRAVENOUS

## 2016-12-25 MED ORDER — PROMETHAZINE HCL 25 MG/ML IJ SOLN: 6.2500 mg | INTRAMUSCULAR | Status: DC | PRN

## 2016-12-25 MED ORDER — SODIUM CHLORIDE 0.9 % IR SOLN
Status: DC | PRN
  Administered 2016-12-25: 09:00:00

## 2016-12-25 MED ORDER — SUGAMMADEX SODIUM 200 MG/2ML IV SOLN
INTRAVENOUS | Status: DC | PRN
  Administered 2016-12-25: 160 mg via INTRAVENOUS

## 2016-12-25 MED ORDER — FENTANYL CITRATE (PF) 250 MCG/5ML IJ SOLN
INTRAMUSCULAR | Status: AC
  Filled 2016-12-25: qty 5

## 2016-12-25 MED ORDER — SUGAMMADEX SODIUM 200 MG/2ML IV SOLN
INTRAVENOUS | Status: AC
  Filled 2016-12-25: qty 2

## 2016-12-25 MED ORDER — OXYCODONE HCL 5 MG PO TABS
5.0000 mg | ORAL_TABLET | ORAL | Status: DC | PRN
  Administered 2016-12-25 (×2): 10 mg via ORAL
  Administered 2016-12-25: 5 mg via ORAL
  Administered 2016-12-26 – 2016-12-27 (×8): 10 mg via ORAL
  Filled 2016-12-25 (×10): qty 2

## 2016-12-25 MED ORDER — LACTATED RINGERS IV SOLN
INTRAVENOUS | Status: DC | PRN
  Administered 2016-12-25 (×2): via INTRAVENOUS

## 2016-12-25 MED ORDER — CELECOXIB 200 MG PO CAPS
200.0000 mg | ORAL_CAPSULE | Freq: Two times a day (BID) | ORAL | Status: DC
  Administered 2016-12-25 – 2016-12-27 (×4): 200 mg via ORAL
  Filled 2016-12-25 (×4): qty 1

## 2016-12-25 SURGICAL SUPPLY — 66 items
ATEC PORO TI PS 10D 9W 25X8X10 (Bone Implant) ×6 IMPLANT
ATEC PORO TIPS 10D 9W 25X9X11 (Bone Implant) ×6 IMPLANT
BAG DECANTER FOR FLEXI CONT (MISCELLANEOUS) ×3 IMPLANT
BASKET BONE COLLECTION (BASKET) ×3 IMPLANT
BENZOIN TINCTURE PRP APPL 2/3 (GAUZE/BANDAGES/DRESSINGS) ×3 IMPLANT
BLADE CLIPPER SURG (BLADE) IMPLANT
BLADE SURG 12 STRL SS (BLADE) ×3 IMPLANT
BONE CANC CHIPS 20CC PCAN1/4 (Bone Implant) ×3 IMPLANT
BUR MATCHSTICK NEURO 3.0 LAGG (BURR) ×3 IMPLANT
CANISTER SUCT 3000ML PPV (MISCELLANEOUS) ×3 IMPLANT
CARTRIDGE OIL MAESTRO DRILL (MISCELLANEOUS) ×1 IMPLANT
CHIPS CANC BONE 20CC PCAN1/4 (Bone Implant) ×1 IMPLANT
CLOSURE WOUND 1/2 X4 (GAUZE/BANDAGES/DRESSINGS) ×1
CONT SPEC 4OZ CLIKSEAL STRL BL (MISCELLANEOUS) ×3 IMPLANT
COVER BACK TABLE 60X90IN (DRAPES) ×3 IMPLANT
DERMABOND ADVANCED (GAUZE/BANDAGES/DRESSINGS) ×2
DERMABOND ADVANCED .7 DNX12 (GAUZE/BANDAGES/DRESSINGS) ×1 IMPLANT
DIFFUSER DRILL AIR PNEUMATIC (MISCELLANEOUS) ×3 IMPLANT
DRAPE C-ARM 42X72 X-RAY (DRAPES) ×3 IMPLANT
DRAPE C-ARMOR (DRAPES) ×3 IMPLANT
DRAPE LAPAROTOMY 100X72X124 (DRAPES) ×3 IMPLANT
DRAPE POUCH INSTRU U-SHP 10X18 (DRAPES) ×3 IMPLANT
DRAPE SURG 17X23 STRL (DRAPES) ×3 IMPLANT
DRSG OPSITE POSTOP 4X6 (GAUZE/BANDAGES/DRESSINGS) ×3 IMPLANT
DURAPREP 26ML APPLICATOR (WOUND CARE) ×3 IMPLANT
ELECT REM PT RETURN 9FT ADLT (ELECTROSURGICAL) ×3
ELECTRODE REM PT RTRN 9FT ADLT (ELECTROSURGICAL) ×1 IMPLANT
EVACUATOR 1/8 PVC DRAIN (DRAIN) IMPLANT
GAUZE SPONGE 4X4 16PLY XRAY LF (GAUZE/BANDAGES/DRESSINGS) IMPLANT
GLOVE BIO SURGEON STRL SZ7 (GLOVE) ×3 IMPLANT
GLOVE BIO SURGEON STRL SZ8 (GLOVE) ×6 IMPLANT
GLOVE BIOGEL PI IND STRL 7.0 (GLOVE) IMPLANT
GLOVE BIOGEL PI INDICATOR 7.0 (GLOVE)
GLOVE ECLIPSE 6.5 STRL STRAW (GLOVE) ×3 IMPLANT
GLOVE SURG SS PI 7.5 STRL IVOR (GLOVE) ×12 IMPLANT
GOWN STRL REUS W/ TWL LRG LVL3 (GOWN DISPOSABLE) IMPLANT
GOWN STRL REUS W/ TWL XL LVL3 (GOWN DISPOSABLE) ×2 IMPLANT
GOWN STRL REUS W/TWL 2XL LVL3 (GOWN DISPOSABLE) IMPLANT
GOWN STRL REUS W/TWL LRG LVL3 (GOWN DISPOSABLE)
GOWN STRL REUS W/TWL XL LVL3 (GOWN DISPOSABLE) ×4
HEMOSTAT POWDER KIT SURGIFOAM (HEMOSTASIS) ×3 IMPLANT
KIT BASIN OR (CUSTOM PROCEDURE TRAY) ×3 IMPLANT
KIT ROOM TURNOVER OR (KITS) ×3 IMPLANT
MILL MEDIUM DISP (BLADE) ×3 IMPLANT
NEEDLE HYPO 25X1 1.5 SAFETY (NEEDLE) ×3 IMPLANT
NS IRRIG 1000ML POUR BTL (IV SOLUTION) ×3 IMPLANT
OIL CARTRIDGE MAESTRO DRILL (MISCELLANEOUS) ×3
PACK LAMINECTOMY NEURO (CUSTOM PROCEDURE TRAY) ×3 IMPLANT
PAD ARMBOARD 7.5X6 YLW CONV (MISCELLANEOUS) ×9 IMPLANT
ROD PC 5.5X70 TI ARSENAL (Rod) ×6 IMPLANT
SCREW CBX 5.5X35MM ARSENAL (Screw) ×6 IMPLANT
SCREW CORT CANC 5.5X40 (Screw) ×12 IMPLANT
SCREW SET SPINAL ARSENAL 47127 (Screw) ×18 IMPLANT
SPACER PORUS ATEC 10D9W25X8X10 (Bone Implant) ×2 IMPLANT
SPONGE LAP 4X18 X RAY DECT (DISPOSABLE) IMPLANT
SPONGE SURGIFOAM ABS GEL 100 (HEMOSTASIS) ×3 IMPLANT
STRIP CLOSURE SKIN 1/2X4 (GAUZE/BANDAGES/DRESSINGS) ×2 IMPLANT
SUT VIC AB 0 CT1 18XCR BRD8 (SUTURE) ×1 IMPLANT
SUT VIC AB 0 CT1 8-18 (SUTURE) ×3
SUT VIC AB 2-0 CP2 18 (SUTURE) ×6 IMPLANT
SUT VIC AB 3-0 SH 8-18 (SUTURE) ×6 IMPLANT
SYR CONTROL 10ML LL (SYRINGE) ×3 IMPLANT
TOWEL GREEN STERILE (TOWEL DISPOSABLE) ×3 IMPLANT
TOWEL GREEN STERILE FF (TOWEL DISPOSABLE) ×3 IMPLANT
TRAY FOLEY W/METER SILVER 16FR (SET/KITS/TRAYS/PACK) ×3 IMPLANT
WATER STERILE IRR 1000ML POUR (IV SOLUTION) ×3 IMPLANT

## 2016-12-25 NOTE — Anesthesia Procedure Notes (Signed)
Procedure Name: Intubation Date/Time: 12/25/2016 7:41 AM Performed by: Tressia Miners LEFFEW Pre-anesthesia Checklist: Patient identified, Patient being monitored, Timeout performed, Emergency Drugs available and Suction available Patient Re-evaluated:Patient Re-evaluated prior to induction Oxygen Delivery Method: Circle System Utilized Preoxygenation: Pre-oxygenation with 100% oxygen Induction Type: IV induction Ventilation: Mask ventilation without difficulty Laryngoscope Size: Mac and 4 Grade View: Grade II Tube type: Oral Tube size: 7.5 mm Number of attempts: 1 Airway Equipment and Method: Bougie stylet Placement Confirmation: ETT inserted through vocal cords under direct vision,  positive ETCO2 and breath sounds checked- equal and bilateral Secured at: 23 cm Tube secured with: Tape Dental Injury: Teeth and Oropharynx as per pre-operative assessment  Comments: Grade II view, bougie needed to pass ETT

## 2016-12-25 NOTE — Anesthesia Preprocedure Evaluation (Addendum)
Anesthesia Evaluation  Patient identified by MRN, date of birth, ID band Patient awake    Reviewed: Allergy & Precautions, NPO status , Patient's Chart, lab work & pertinent test results  Airway Mallampati: II   Neck ROM: Full  Mouth opening: Limited Mouth Opening  Dental  (+) Teeth Intact, Dental Advisory Given   Pulmonary asthma ,    breath sounds clear to auscultation       Cardiovascular hypertension, Pt. on medications negative cardio ROS   Rhythm:Regular Rate:Normal     Neuro/Psych negative neurological ROS     GI/Hepatic negative GI ROS, Neg liver ROS,   Endo/Other  negative endocrine ROSdiabetes, Type 2, Oral Hypoglycemic Agents  Renal/GU Renal disease     Musculoskeletal negative musculoskeletal ROS (+)   Abdominal   Peds  Hematology negative hematology ROS (+)   Anesthesia Other Findings Day of surgery medications reviewed with the patient.  Reproductive/Obstetrics                            Anesthesia Physical Anesthesia Plan  ASA: II  Anesthesia Plan: General   Post-op Pain Management:    Induction: Intravenous  PONV Risk Score and Plan: 4 or greater and Ondansetron, Dexamethasone, Propofol, Midazolam and Scopolamine patch - Pre-op  Airway Management Planned: Oral ETT  Additional Equipment:   Intra-op Plan:   Post-operative Plan: Extubation in OR  Informed Consent: I have reviewed the patients History and Physical, chart, labs and discussed the procedure including the risks, benefits and alternatives for the proposed anesthesia with the patient or authorized representative who has indicated his/her understanding and acceptance.   Dental advisory given  Plan Discussed with: CRNA  Anesthesia Plan Comments:         Anesthesia Quick Evaluation

## 2016-12-25 NOTE — Anesthesia Postprocedure Evaluation (Signed)
Anesthesia Post Note  Patient: [name]  Procedure(s) Performed: Procedure(s) (LRB): Posterior Lumbar Interbody Fusion - Lumbar three-Lumbar four - Lumbar four- Lumbar five (N/A)     Patient location during evaluation: PACU Anesthesia Type: General Level of consciousness: awake and alert Pain management: pain level controlled Vital Signs Assessment: post-procedure vital signs reviewed and stable Respiratory status: spontaneous breathing, nonlabored ventilation, respiratory function stable and patient connected to nasal cannula oxygen Cardiovascular status: blood pressure returned to baseline and stable Postop Assessment: no signs of nausea or vomiting Anesthetic complications: no    Last Vitals:  Vitals:   12/25/16 1445 12/25/16 1515  BP: (!) 133/52 (!) 137/55  Pulse: (!) 59 (!) 59  Resp: 15 16  Temp:  (!) 36.3 C                 Vinluan SilvasKevin D Samyria Rudie

## 2016-12-25 NOTE — Progress Notes (Signed)
Pt arrived to 5C08 via stretcher.  Pt alert and oriented, family at bedside.  VSS.  Will continue to monitor.  Sondra ComeSilva, Mack Thurmon M, RN

## 2016-12-25 NOTE — Op Note (Signed)
12/25/2016  11:43 AM  PATIENT:  Stephanie Gonzalez  76 y.o. 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 Decompressive lumbar laminectomy L3-4 L4-5 requiring more work than would be required for a simple exposure of the disk for PLIF in order to adequately decompress the neural elements and address the spinal stenosis 2. Posterior lumbar interbody fusion L3-4 L4-5 using porous titanium interbody cages packed with morcellized allograft and autograft 3. Posterior fixation L3-4 L4-5 using ATEC cortical pedicle screws.  4. Intertransverse arthrodesis L3-4 L4-5 using morcellized autograft and allograft.  SURGEON:  Marikay Alar, MD  ASSISTANTSAdelene Idler FNP  ANESTHESIA:  General  EBL: 300 ml  Total I/O In: 1700 [I.V.:1700] Out: 1450 [Urine:1150; Blood:300]  BLOOD ADMINISTERED:none  DRAINS: none   INDICATION FOR PROCEDURE: This patient presented with severe back and leg pain. 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 The patient tried a reasonable attempt at conservative medical measures without relief. I recommended decompression and instrumented fusion to address the stenosis as well as the segmental stability.  Patient understood the risks, benefits, and alternatives and potential outcomes and wished to proceed.  PROCEDURE DETAILS:  The patient was brought to the operating room. After induction of generalized endotracheal anesthesia the patient was rolled into the prone position on chest rolls and all pressure points were padded. The patient's lumbar region was cleaned and then prepped with DuraPrep and draped in the usual sterile fashion. Anesthesia was injected and then a dorsal midline incision was made and carried down to the lumbosacral fascia. The fascia was opened and the paraspinous musculature was taken down in a subperiosteal fashion to expose  L3-4 and L4-5. A self-retaining retractor was placed. Intraoperative fluoroscopy confirmed my level, and I started with placement of the L3 cortical pedicle screws. The pedicle screw entry zones were identified utilizing surface landmarks and  AP and lateral fluoroscopy. I scored the cortex with the high-speed drill and then used the hand drill  to drill an upward and outward direction into the pedicle. I then tapped line to line, and the tap was also monitored. I then placed a 5.5 x 35 mm cortical pedicle screw into the pedicles of L3 bilaterally. I then turned my attention to the decompression and complete lumbar laminectomies, hemi- facetectomies, and foraminotomies were performed at L3-4 and L4-5. The patient had significant spinal stenosis and this required more work than would be required for a simple exposure of the disc for posterior lumbar interbody fusion. Much more generous decompression was undertaken in order to adequately decompress the neural elements and address the patient's leg pain. The yellow ligament was removed to expose the underlying dura and nerve roots, and generous foraminotomies were performed to adequately decompress the neural elements at both levels. Both the exiting and traversing nerve roots were decompressed on both sides until a coronary dilator passed easily along the nerve roots. Once the decompression was complete, I turned my attention to the posterior lower lumbar interbody fusion. The epidural venous vasculature was coagulated and cut sharply. Disc space was incised and the initial discectomy was performed with pituitary rongeurs. The disc space was distracted with sequential distractors to a height of 10 mm. We then used a series of scrapers and shavers to prepare the endplates for fusion. The midline was prepared with Epstein curettes. Once the complete discectomy was finished, we packed an appropriate sized porous titanium  interbody cage with local autograft and  morcellized allograft, gently retracted the nerve root, and tapped the cage into position at L3-4 and L4-5.  The midline between the cages was packed with morselized autograft and allograft. We then turned our attention to the placement of the lower pedicle screws. The pedicle screw entry zones were identified utilizing surface landmarks and fluoroscopy. I drilled into each pedicle utilizing the hand drill and EMG monitoring, and tapped each pedicle with the appropriate tap. We palpated with a ball probe to assure no break in the cortex. We then placed 5.5 x 40 mm cortical pedicle screws into the pedicles bilaterally at L4 and L5. We then decorticated the transverse processes and laid a mixture of morcellized autograft and allograft out over these to perform intertransverse arthrodesis at L3-4 L4-5. We then placed lordotic rods into the multiaxial screw heads of the pedicle screws and locked these in position with the locking caps and anti-torque device. We then checked our construct with AP and lateral fluoroscopy. Irrigated with copious amounts of bacitracin-containing saline solution. Inspected the nerve roots once again to assure adequate decompression, lined to the dura with Gelfoam, and closed the muscle and the fascia with 0 Vicryl. Closed the subcutaneous tissues with 2-0 Vicryl and subcuticular tissues with 3-0 Vicryl. The skin was closed with benzoin and Steri-Strips. Dressing was then applied, the patient was awakened from general anesthesia and transported to the recovery room in stable condition. At the end of the procedure all sponge, needle and instrument counts were correct.   PLAN OF CARE: admit to inpatient  PATIENT DISPOSITION:  PACU - hemodynamically stable.   Delay start of Pharmacological VTE agent (>24hrs) due to surgical blood loss or risk of bleeding:  yes

## 2016-12-25 NOTE — Transfer of Care (Signed)
Immediate Anesthesia Transfer of Care Note  Patient: Stephanie Gonzalez  Procedure(s) Performed: Procedure(s): Posterior Lumbar Interbody Fusion - Lumbar three-Lumbar four - Lumbar four- Lumbar five (N/A)  Patient Location: PACU  Anesthesia Type:General  Level of Consciousness: awake, alert , oriented and responds to stimulation  Airway & Oxygen Therapy: Patient Spontanous Breathing and Patient connected to face mask oxygen  Post-op Assessment: Report given to RN, Post -op Vital signs reviewed and stable and Patient moving all extremities X 4  Post vital signs: Reviewed and stable  Last Vitals:  Vitals:   12/25/16 0632  BP: (!) 158/61  Pulse: (!) 57  Resp: 20  Temp: 36.6 C    Last Pain:  Vitals:   12/25/16 0633  TempSrc:   PainSc: 4       Patients Stated Pain Goal: 3 (12/25/16 [id number])  Complications: No apparent anesthesia complications

## 2016-12-25 NOTE — H&P (Signed)
Subjective: Patient is a 76 y.o. female admitted for PLIF. Onset of symptoms was several months ago, gradually worsening since that time.  The pain is rated severe, unremitting, and is located at the across the lower back and radiates to RLE. The pain is described as aching and occurs all day. The symptoms have been progressive. Symptoms are exacerbated by exercise. MRI or CT showed stenosis with instability  Past Medical History:  Diagnosis Date  . ASTHMA 10/06/2007  . Bronchial asthma   . BURSITIS, RIGHT HIP 02/04/2010  . DIABETES MELLITUS, TYPE II 11/01/2009  . Family history of adverse reaction to anesthesia    Mother had n/v  . H/O vertigo   . History of kidney stones    passed on her own  . HYPERLIPIDEMIA 10/06/2007  . HYPERTENSION 10/06/2007  . Pneumonia   . Restless leg syndrome     Past Surgical History:  Procedure Laterality Date  . ABDOMINAL HYSTERECTOMY    . APPENDECTOMY    . CARDIAC CATHETERIZATION  1992   ? 90's  . CHOLECYSTECTOMY    . COLONOSCOPY    . KNEE ARTHROSCOPY Right   . SPINAL CORD STIMULATOR INSERTION N/A 07/13/2014   Procedure: LUMBAR SPINAL CORD STIMULATOR INSERTION;  Surgeon: Venita Lickahari Brooks, MD;  Location: [facility] OR;  Service: Orthopedics;  Laterality: N/A;  . TONSILLECTOMY    . TUBAL LIGATION      Prior to Admission medications   Medication Sig Start Date End Date Taking? Authorizing Provider  amLODipine (NORVASC) 5 MG tablet TAKE 1 TABLET BY MOUTH  DAILY Patient taking differently: TAKE 5 MG BY MOUTH  DAILY 05/21/16  Yes Gordy SaversKwiatkowski, Peter F, MD  atorvastatin (LIPITOR) 40 MG tablet TAKE 1 TABLET BY MOUTH  DAILY Patient taking differently: TAKE 40 MG BY MOUTH  DAILY 12/22/16  Yes Gordy SaversKwiatkowski, Peter F, MD  b complex vitamins tablet Take 1 tablet by mouth daily.   Yes [provider]  clonazePAM (KLONOPIN) 1 MG tablet TAKE 1 TABLET BY MOUTH AT BEDTME AS NEEDED Patient taking differently: TAKE 1 MG BY MOUTH AT BEDTME AS NEEDED FOR RESTLESS LEG 09/30/16   Yes Gordy SaversKwiatkowski, Peter F, MD  HYDROcodone-acetaminophen [facility](NORCO) 10-325 MG tablet Take 0.5 tablets by mouth 2 (two) times daily as needed for moderate pain. Patient taking differently: Take 0.5 tablets by mouth every 6 (six) hours as needed for moderate pain.  03/04/16  Yes Gordy SaversKwiatkowski, Peter F, MD  LYSINE PO Take 1 tablet by mouth daily as needed (for cold sores).    Yes [provider]  metFORMIN (GLUCOPHAGE) 500 MG tablet Take 1 tablet by mouth  twice a day with meals Patient taking differently: Take 500 mg by mouth once daily in the morning 10/25/15  Yes Gordy SaversKwiatkowski, Peter F, MD  methocarbamol (ROBAXIN) 500 MG tablet Take 500 mg by mouth 3 (three) times daily as needed for muscle spasms.   Yes Sheran Luzamos, Richard, MD  valACYclovir (VALTREX) 500 MG tablet Take 1 tablet (500 mg total) by mouth 2 (two) times daily. Patient taking differently: Take 500 mg by mouth 2 (two) times daily as needed (for cold sores).  07/07/16  Yes Gordy SaversKwiatkowski, Peter F, MD  albuterol [facility](PROAIR HFA) 108 (90 BASE) MCG/ACT inhaler Inhale 2 puffs into the lungs every 6 (six) hours as needed for wheezing.     [provider]  glucose blood (FREESTYLE LITE) test strip USE TO CHECK BLOOD SUGAR DAILY AND PRN Patient not taking: Reported on 12/24/2016 05/15/14   Gordy SaversKwiatkowski, Peter F,  MD  HYDROcodone-homatropine (HYCODAN) 5-1.5 MG/5ML syrup Take 5 mLs by mouth every 6 (six) hours as needed for cough. Patient not taking: Reported on 07/07/2016 11/16/15   Gordy Savers, MD  Lancets (FREESTYLE) lancets USE TO CHECK BLOOD SUGAR DAILY AND PRN Patient not taking: Reported on 12/24/2016 05/15/14   Gordy Savers, MD  meclizine (ANTIVERT) 25 MG tablet Take 1 tablet (25 mg total) by mouth 3 (three) times daily as needed for dizziness. 09/11/15   Kirichenko, Lemont Fillers, PA-C   Allergies  Allergen Reactions  . Tetracycline Rash and Other (See Comments)    Agitation    Social History  Substance Use Topics  . Smoking status:  Never Smoker  . Smokeless tobacco: Never Used  . Alcohol use No    Family History  Problem Relation Age of Onset  . Alzheimer's disease Mother   . Cancer Father   . Alcoholism Father      Review of Systems  Positive ROS: neg  All other systems have been reviewed and were otherwise negative with the exception of those mentioned in the HPI and as above.  Objective: Vital signs in last 24 hours: Temp:  [97.9 F (36.6 C)] 97.9 F (36.6 C) (07/19 1610) Pulse Rate:  [57-68] 57 (07/19 0632) Resp:  [20] 20 (07/19 9604) BP: (158)/(61-78) 158/61 (07/19 0632) SpO2:  [97 %-98 %] 98 % (07/19 5409) Weight:  [77.6 kg (171 lb)-77.9 kg (171 lb 12.8 oz)] 77.6 kg (171 lb) (07/19 8119)  General Appearance: Alert, cooperative, no distress, appears stated age Head: Normocephalic, without obvious abnormality, atraumatic Eyes: PERRL, conjunctiva/corneas clear, EOM's intact    Neck: Supple, symmetrical, trachea midline Back: Symmetric, no curvature, ROM normal, no CVA tenderness Lungs:  respirations unlabored Heart: Regular rate and rhythm Abdomen: Soft, non-tender Extremities: Extremities normal, atraumatic, no cyanosis or edema Pulses: 2+ and symmetric all extremities Skin: Skin color, texture, turgor normal, no rashes or lesions  NEUROLOGIC:   Mental status: Alert and oriented x4,  no aphasia, good attention span, fund of knowledge, and memory Motor Exam - grossly normal Sensory Exam - grossly normal Reflexes: 1+ Coordination - grossly normal Gait - grossly normal Balance - grossly normal Cranial Nerves: I: smell Not tested  II: visual acuity  OS: nl    OD: nl  II: visual fields Full to confrontation  II: pupils Equal, round, reactive to light  III,VII: ptosis None  III,IV,VI: extraocular muscles  Full ROM  V: mastication Normal  V: facial light touch sensation  Normal  V,VII: corneal reflex  Present  VII: facial muscle function - upper  Normal  VII: facial muscle function -  lower Normal  VIII: hearing Not tested  IX: soft palate elevation  Normal  IX,X: gag reflex Present  XI: trapezius strength  5/5  XI: sternocleidomastoid strength 5/5  XI: neck flexion strength  5/5  XII: tongue strength  Normal    Data Review Lab Results  Component Value Date   WBC 7.5 12/24/2016   HGB 13.0 12/24/2016   HCT 38.8 12/24/2016   MCV 84.2 12/24/2016   PLT 217 12/24/2016   Lab Results  Component Value Date   NA 139 12/24/2016   K 3.7 12/24/2016   CL 106 12/24/2016   CO2 28 12/24/2016   BUN 18 12/24/2016   CREATININE 0.78 12/24/2016   GLUCOSE 100 (H) 12/24/2016   Lab Results  Component Value Date   INR 0.95 12/24/2016    Assessment/Plan: Patient admitted for PLIF L3-4 L4-5.  Patient has failed a reasonable attempt at conservative therapy.  I explained the condition and procedure to the patient and answered any questions.  Patient wishes to proceed with procedure as planned. Understands risks/ benefits and typical outcomes of procedure.   JONES,DAVID S 12/25/2016 7:20 AM

## 2016-12-26 LAB — GLUCOSE, CAPILLARY
GLUCOSE-CAPILLARY: 129 mg/dL — AB (ref 65–99)
GLUCOSE-CAPILLARY: 106 mg/dL — AB (ref 65–99)
Glucose-Capillary: 115 mg/dL — ABNORMAL HIGH (ref 65–99)
Glucose-Capillary: 104 mg/dL — ABNORMAL HIGH (ref 65–99)

## 2016-12-26 MED ORDER — MUPIROCIN 2 % EX OINT
TOPICAL_OINTMENT | Freq: Two times a day (BID) | CUTANEOUS | Status: DC
  Administered 2016-12-26 – 2016-12-27 (×3): via NASAL
  Filled 2016-12-26: qty 22

## 2016-12-26 NOTE — Evaluation (Signed)
Occupational Therapy Evaluation and Discharge Patient Details Name: Stephanie Gonzalez MRN: [id number] DOB: 1941-03-24 Today's Date: 12/26/2016    History of Present Illness Pt is a 76 yo female admitted for a lumbar laminectomy at L3-4, L4-5 with a fixation, posterior arthrodesis and auto& allograft. PMH significant for Asthma, DM2, vertigo, kidney stones, HLD, HTN, RLS.    Clinical Impression   PTA pt independent in ADL and mobility. Pt currently mod A for LB ADL and min guard for mobility with RW. Reviewed adls/IADLs and compensatory strategies in detail. Pt educated on: clothing between brace, never sleep in brace, set an alarm at night for medication, avoid sitting for long periods of time, correct bed positioning for sleeping, correct sequence for bed mobility, AE kit in entirety, avoiding lifting more than 5 pounds and never wash directly over incision. All education is complete and patient indicates understanding. OT will sign off at this time as Pt will have excellent 24 hour supervision and assist. No questions or concerns for OT from Pt at this time. Thank you for this referral.      Follow Up Recommendations  No OT follow up;Supervision/Assistance - 24 hour    Equipment Recommendations  3 in 1 bedside commode    Recommendations for Other Services       Precautions / Restrictions Precautions Precautions: Back Precaution Booklet Issued: No Precaution Comments: reviewed 3/3 precautions with pt throughout session Required Braces or Orthoses: Spinal Brace Spinal Brace: Lumbar corset;Applied in sitting position Restrictions Weight Bearing Restrictions: No      Mobility Bed Mobility Overal bed mobility: Needs Assistance Bed Mobility: Rolling;Sidelying to Sit;Sit to Sidelying Rolling: Min assist Sidelying to sit: Min assist     Sit to sidelying: Min assist General bed mobility comments: Min A to bring LE's into and out of bed  Transfers Overall transfer level: Needs  assistance Equipment used: Rolling walker (2 wheeled) Transfers: Sit to/from Stand Sit to Stand: Min guard         General transfer comment: Min gaurd for safety    Balance Overall balance assessment: Needs assistance Sitting-balance support: No upper extremity supported;Feet supported Sitting balance-Leahy Scale: Good     Standing balance support: Bilateral upper extremity supported;No upper extremity supported;During functional activity Standing balance-Leahy Scale: Fair Standing balance comment: able to stand at sink and brush teeth                           ADL either performed or assessed with clinical judgement   ADL Overall ADL's : Needs assistance/impaired Eating/Feeding: Modified independent;Sitting   Grooming: Min guard;Standing   Upper Body Bathing: Minimal assistance;With caregiver independent assisting;With adaptive equipment;Sitting   Lower Body Bathing: Minimal assistance;With caregiver independent assisting;With adaptive equipment;Sitting/lateral leans   Upper Body Dressing : Minimal assistance;With caregiver independent assisting;Sitting Upper Body Dressing Details (indicate cue type and reason): to don brace Lower Body Dressing: Moderate assistance;With caregiver independent assisting;With adaptive equipment;Sit to/from stand   Toilet Transfer: Min guard   Toileting- Water quality scientist and Hygiene: Minimal assistance;With caregiver independent assisting;With adaptive equipment       Functional mobility during ADLs: Min guard;Rolling walker General ADL Comments: Pt edcuated in entire AE kit Pt declined as "i will have my family help me at home, but I wish that I could have those because they would be helpful"     Vision Baseline Vision/History: Wears glasses Wears Glasses: At all times Patient Visual Report: No change from baseline  Perception     Praxis      Pertinent Vitals/Pain Pain Assessment: 0-10 Pain Score: 8  Pain  Location: incision Pain Descriptors / Indicators: Sore;Discomfort Pain Intervention(s): Monitored during session;Repositioned;Premedicated before session     Hand Dominance Right   Extremity/Trunk Assessment Upper Extremity Assessment Upper Extremity Assessment: Overall WFL for tasks assessed   Lower Extremity Assessment Lower Extremity Assessment: Defer to PT evaluation   Cervical / Trunk Assessment Cervical / Trunk Assessment: Other exceptions Cervical / Trunk Exceptions: s/p surgery   Communication Communication Communication: No difficulties   Cognition Arousal/Alertness: Awake/alert Behavior During Therapy: WFL for tasks assessed/performed Overall Cognitive Status: Within Functional Limits for tasks assessed                                     General Comments       Exercises     Shoulder Instructions      Home Living Family/patient expects to be discharged to:: Private residence Living Arrangements: Spouse/significant other Available Help at Discharge: Friend(s);Available 24 hours/day;Neighbor Type of Home: House Home Access: Stairs to enter [employer] of Steps: 1 Entrance Stairs-Rails: None Home Layout: One level     Bathroom Shower/Tub: Teacher, early years/pre: Standard Bathroom Accessibility: Yes How Accessible: Accessible via walker Home Equipment: Shower seat;Hand held shower head;Grab bars - tub/shower   Additional Comments: Pt has excellent family support      Prior Functioning/Environment Level of Independence: Independent                 OT Problem List: Impaired balance (sitting and/or standing);Decreased activity tolerance;Decreased knowledge of use of DME or AE;Decreased knowledge of precautions;Pain      OT Treatment/Interventions:      OT Goals(Current goals can be found in the care plan section) Acute Rehab OT Goals Patient Stated Goal: to get back to traveling OT Goal Formulation: With  patient Time For Goal Achievement: 01/09/17 Potential to Achieve Goals: Good  OT Frequency:     Barriers to D/C:            Co-evaluation              AM-PAC PT "6 Clicks" Daily Activity     Outcome Measure Help from another person eating meals?: None Help from another person taking care of personal grooming?: A Little Help from another person toileting, which includes using toliet, bedpan, or urinal?: A Little Help from another person bathing (including washing, rinsing, drying)?: A Little Help from another person to put on and taking off regular upper body clothing?: A Little Help from another person to put on and taking off regular lower body clothing?: A Lot 6 Click Score: 18   End of Session Equipment Utilized During Treatment: Gait belt;Rolling walker;Back brace Nurse Communication: Mobility status  Activity Tolerance: Patient tolerated treatment well Patient left: in bed;with call bell/phone within reach;with bed alarm set  OT Visit Diagnosis: Unsteadiness on feet (R26.81);Pain Pain - Right/Left: Right Pain - part of body: Leg (back)                Time: 3953-2023 OT Time Calculation (min): 42 min Charges:  OT General Charges $OT Visit: 1 Procedure OT Evaluation $OT Eval Moderate Complexity: 1 Procedure OT Treatments $Self Care/Home Management : 23-37 mins G-Codes:     Hulda Humphrey OTR/L [place] 12/26/2016, 5:38 PM

## 2016-12-26 NOTE — Telephone Encounter (Signed)
Okay for refill Please schedule follow-up office visit

## 2016-12-26 NOTE — Progress Notes (Signed)
Orthopedic Tech Progress Note Patient Details:  Alfonso RamusSue M Breau [birth year] [id number]  Patient ID: Alfonso RamusSue M Sakamoto, female   DOB: [birth year], 76 y.o.   MRN: [id number]   Saul FordyceJennifer C Ziyah Cordoba 12/26/2016, 9:02 AMCalled [employer] for Quikdraw brace.

## 2016-12-26 NOTE — Progress Notes (Signed)
Patient ID: Stephanie Gonzalez, female   DOB: 12/30/1940, 76 y.o.   MRN: [id number] Subjective: Patient reports back soreness, no leg pain or NTW!  Objective: Vital signs in last 24 hours: Temp:  [97.3 F (36.3 C)-98.5 F (36.9 C)] 97.4 F (36.3 C) (07/20 0933) Pulse Rate:  [58-84] 65 (07/20 0933) Resp:  [12-24] 19 (07/20 0933) BP: (115-156)/(40-83) 118/40 (07/20 0933) SpO2:  [93 %-100 %] 99 % (07/20 0933)  Intake/Output from previous day: 07/19 0701 - 07/20 0700 In: 3615 [P.O.:500; I.V.:3115] Out: 3300 [Urine:3000; Blood:300] Intake/Output this shift: No intake/output data recorded.  Neurologic: Grossly normal  Lab Results: Lab Results  Component Value Date   WBC 7.5 12/24/2016   HGB 13.0 12/24/2016   HCT 38.8 12/24/2016   MCV 84.2 12/24/2016   PLT 217 12/24/2016   Lab Results  Component Value Date   INR 0.95 12/24/2016   BMET Lab Results  Component Value Date   NA 139 12/24/2016   K 3.7 12/24/2016   CL 106 12/24/2016   CO2 28 12/24/2016   GLUCOSE 100 (H) 12/24/2016   BUN 18 12/24/2016   CREATININE 0.78 12/24/2016   CALCIUM 9.7 12/24/2016    Studies/Results: Chest 2 View  Result Date: 12/24/2016 CLINICAL DATA:  Preop for spinal cord stimulator replacement. EXAM: CHEST  2 VIEW COMPARISON:  Two-view chest x-ray FINDINGS: Heart size is normal. The lungs are clear. Spinal cord stimulator is stable in position. Surgical clips are present at the gallbladder fossa. IMPRESSION: No active cardiopulmonary disease. Electronically Signed   By: Marin Robertshristopher  Mattern M.D.   On: 12/24/2016 14:51   Dg Lumbar Spine 2-3 Views  Result Date: 12/25/2016 CLINICAL DATA:  Lumbar spine surgery . EXAM: DG C-ARM 61-120 MIN; LUMBAR SPINE - 2-3 VIEW COMPARISON:  CT 11/13/2016. FINDINGS: Lumbar spine numbered as per prior CT. L3 through L5 posterior and interbody fusion. Hardware intact. Anatomic alignment . IMPRESSION: L3 through L5 posterior and interbody fusion. Hardware intact. Anatomic  alignment. Electronically Signed   By: Maisie Fushomas  Register   On: 12/25/2016 12:19   Dg C-arm 61-120 Min  Result Date: 12/25/2016 CLINICAL DATA:  Lumbar spine surgery . EXAM: DG C-ARM 61-120 MIN; LUMBAR SPINE - 2-3 VIEW COMPARISON:  CT 11/13/2016. FINDINGS: Lumbar spine numbered as per prior CT. L3 through L5 posterior and interbody fusion. Hardware intact. Anatomic alignment . IMPRESSION: L3 through L5 posterior and interbody fusion. Hardware intact. Anatomic alignment. Electronically Signed   By: Maisie Fushomas  Register   On: 12/25/2016 12:19    Assessment/Plan: Doing well, home tomorrow   LOS: 1 day    Stephanie Gonzalez S 12/26/2016, 10:57 AM

## 2016-12-26 NOTE — Evaluation (Signed)
Physical Therapy Evaluation Patient Details Name: Stephanie RamusSue M Thinnes MRN: [id number] DOB: 1940/06/30 Today's Date: 12/26/2016   History of Present Illness  Pt is a 76 yo female admitted for a lumbar laminectomy at L3-4, L4-5 with a fixation, posterior arthrodesis and auto& allograft. PMH significant for Asthma, DM2, vertigo, kidney stones, HLD, HTN, RLS.   Clinical Impression  Pt presents with the above diagnosis and below deficits for therapy evaluation. Prior to admission, pt lived with her husband in a two-story home with the main living environment on the first level. Pt was completely independent prior to admission. Pt requires Min A to min guard for all mobility this session and is able to recall all of her back precautions. Pt will benefit from continued acute PT follow-up in order to address the below deficits prior to discharge.     Follow Up Recommendations No PT follow up    Equipment Recommendations  None recommended by PT    Recommendations for Other Services       Precautions / Restrictions Precautions Precautions: Back Precaution Booklet Issued: No Precaution Comments: reviewed 3/3 precautions with pt throughout session Required Braces or Orthoses: Spinal Brace Spinal Brace: Lumbar corset;Applied in sitting position Restrictions Weight Bearing Restrictions: No      Mobility  Bed Mobility Overal bed mobility: Needs Assistance Bed Mobility: Rolling;Sidelying to Sit;Sit to Sidelying Rolling: Min assist Sidelying to sit: Min assist     Sit to sidelying: Min assist General bed mobility comments: Min A to bring LE's into and out of bed  Transfers Overall transfer level: Needs assistance Equipment used: Rolling walker (2 wheeled) Transfers: Sit to/from Stand Sit to Stand: Min guard         General transfer comment: Min gaurd for safety  Ambulation/Gait Ambulation/Gait assistance: Min guard Ambulation Distance (Feet): 150 Feet Assistive device: Rolling walker  (2 wheeled) Gait Pattern/deviations: Step-through pattern;Decreased stride length Gait velocity: decreased Gait velocity interpretation: Below normal speed for age/gender General Gait Details: slow steady steady gait with RW with cues for proximity to [employer]  Stairs            Wheelchair Mobility    Modified Rankin (Stroke Patients Only)       Balance Overall balance assessment: Needs assistance Sitting-balance support: No upper extremity supported;Feet supported Sitting balance-Leahy Scale: Good     Standing balance support: Bilateral upper extremity supported;No upper extremity supported;During functional activity Standing balance-Leahy Scale: Fair Standing balance comment: able to stand at sink and brush teeth                             Pertinent Vitals/Pain Pain Assessment: 0-10 Pain Score: 7  Pain Location: incision Pain Descriptors / Indicators: Sore;Discomfort Pain Intervention(s): Monitored during session;Premedicated before session;Repositioned    Home Living Family/patient expects to be discharged to:: Private residence Living Arrangements: Spouse/significant other Available Help at Discharge: Friend(s);Available 24 hours/day;Neighbor Type of Home: House Home Access: Stairs to enter Entrance Stairs-Rails: None Entrance Stairs-Number of Steps: 1 Home Layout: One level Home Equipment: Shower seat;Hand held shower head;Grab bars - tub/shower      Prior Function Level of Independence: Independent               Hand Dominance   Dominant Hand: Right    Extremity/Trunk Assessment   Upper Extremity Assessment Upper Extremity Assessment: Defer to OT evaluation    Lower Extremity Assessment Lower Extremity Assessment: Overall WFL for tasks assessed  Cervical / Trunk Assessment Cervical / Trunk Assessment: Other exceptions Cervical / Trunk Exceptions: s/p surgery  Communication   Communication: No difficulties  Cognition  Arousal/Alertness: Awake/alert Behavior During Therapy: WFL for tasks assessed/performed Overall Cognitive Status: Within Functional Limits for tasks assessed                                        General Comments      Exercises     Assessment/Plan    PT Assessment Patient needs continued PT services  PT Problem List Decreased activity tolerance;Decreased balance;Decreased mobility;Decreased knowledge of use of DME;Pain       PT Treatment Interventions DME instruction;Gait training;Stair training;Functional mobility training;Therapeutic activities;Therapeutic exercise;Balance training    PT Goals (Current goals can be found in the Care Plan section)  Acute Rehab PT Goals Patient Stated Goal: to have less pain PT Goal Formulation: With patient Time For Goal Achievement: 01/02/17 Potential to Achieve Goals: Good    Frequency Min 5X/week   Barriers to discharge        Co-evaluation               AM-PAC PT "6 Clicks" Daily Activity  Outcome Measure Difficulty turning over in bed (including adjusting bedclothes, sheets and blankets)?: Total Difficulty moving from lying on back to sitting on the side of the bed? : Total Difficulty sitting down on and standing up from a chair with arms (e.g., wheelchair, bedside commode, etc,.)?: Total Help needed moving to and from a bed to chair (including a wheelchair)?: A Little Help needed walking in hospital room?: A Little Help needed climbing 3-5 steps with a railing? : A Lot 6 Click Score: 11    End of Session Equipment Utilized During Treatment: Gait belt;Back brace Activity Tolerance: Patient tolerated treatment well Patient left: in bed;with call bell/phone within reach;with SCD's reapplied Nurse Communication: Mobility status PT Visit Diagnosis: Unsteadiness on feet (R26.81);Difficulty in walking, not elsewhere classified (R26.2);Pain Pain - Right/Left:  (central) Pain - part of body:  (lumbar)     Time: 9147-8295 PT Time Calculation (min) (ACUTE ONLY): 28 min   Charges:   PT Evaluation $PT Eval Moderate Complexity: 1 Procedure PT Treatments $Gait Training: 8-22 mins   PT G Codes:        Colin Broach PT, DPT  [phone]   Ruel Favors Aletha Halim 12/26/2016, 4:28 PM

## 2016-12-26 NOTE — Telephone Encounter (Signed)
LMTCB

## 2016-12-27 LAB — GLUCOSE, CAPILLARY
Glucose-Capillary: 102 mg/dL — ABNORMAL HIGH (ref 65–99)
Glucose-Capillary: 120 mg/dL — ABNORMAL HIGH (ref 65–99)

## 2016-12-27 MED ORDER — OXYCODONE HCL 5 MG PO TABS: 5.0000 mg | ORAL_TABLET | ORAL | 0 refills | Status: DC | PRN

## 2016-12-27 MED ORDER — METHOCARBAMOL 500 MG PO TABS: 500.0000 mg | ORAL_TABLET | Freq: Four times a day (QID) | ORAL | 1 refills | Status: DC | PRN

## 2016-12-27 NOTE — Discharge Summary (Signed)
Physician Discharge Summary  Patient ID: Stephanie RamusSue M Gonzalez MRN: [id number] DOB/AGE: 12/18/1940 76 y.o.  Admit date: 12/25/2016 Discharge date: 12/27/2016  Admission Diagnoses:Lumbar stenosis, scoliosis, spondylolisthesis, radiculopathy L 34, L 45 levels  Discharge Diagnoses: Lumbar stenosis, scoliosis, spondylolisthesis, radiculopathy L 34, L 45 levels Active Problems:   S/P lumbar spinal fusion   Discharged Condition: good  Hospital Course: Patient underwent decompression and fusion L 34 and L 45 levels, from which she did well  Consults: None  Significant Diagnostic Studies: None  Treatments: surgery: Patient underwent decompression and fusion L 34 and L 45 levels  Discharge Exam: Blood pressure (!) 124/58, pulse 72, temperature 98.9 F (37.2 C), temperature source Oral, resp. rate 20, height 5\' 5"  (1.651 m), weight 171 lb (77.6 kg), SpO2 98 %. Neurologic: Alert and oriented X 3, normal strength and tone. Normal symmetric reflexes. Normal coordination and gait Wound:CDI  Disposition: Home  Discharge Instructions    Diet - low sodium heart healthy    Complete by:  As directed    Increase activity slowly    Complete by:  As directed      Allergies as of 12/27/2016      Reactions   Tetracycline Rash, Other (See Comments)   Agitation      Medication List    TAKE these medications   amLODipine 5 MG tablet Commonly known as:  NORVASC TAKE 1 TABLET BY MOUTH  DAILY What changed:  See the new instructions.   atorvastatin 40 MG tablet Commonly known as:  LIPITOR TAKE 1 TABLET BY MOUTH  DAILY What changed:  See the new instructions.   b complex vitamins tablet Take 1 tablet by mouth daily.   clonazePAM 1 MG tablet Commonly known as:  KLONOPIN TAKE 1 TABLET BY MOUTH AT BEDTME AS NEEDED What changed:  See the new instructions.   freestyle lancets USE TO CHECK BLOOD SUGAR DAILY AND PRN   glucose blood test strip Commonly known as:  FREESTYLE LITE USE TO CHECK  BLOOD SUGAR DAILY AND PRN   HYDROcodone-acetaminophen 10-325 MG tablet Commonly known as:  NORCO Take 0.5 tablets by mouth 2 (two) times daily as needed for moderate pain. What changed:  when to take this   HYDROcodone-homatropine 5-1.5 MG/5ML syrup Commonly known as:  HYCODAN Take 5 mLs by mouth every 6 (six) hours as needed for cough.   LYSINE PO Take 1 tablet by mouth daily as needed (for cold sores).   meclizine 25 MG tablet Commonly known as:  ANTIVERT Take 1 tablet (25 mg total) by mouth 3 (three) times daily as needed for dizziness.   metFORMIN 500 MG tablet Commonly known as:  GLUCOPHAGE Take 1 tablet by mouth  twice a day with meals What changed:  See the new instructions.   methocarbamol 500 MG tablet Commonly known as:  ROBAXIN Take 500 mg by mouth 3 (three) times daily as needed for muscle spasms. What changed:  Another medication with the same name was added. Make sure you understand how and when to take each.   methocarbamol 500 MG tablet Commonly known as:  ROBAXIN Take 1 tablet (500 mg total) by mouth every 6 (six) hours as needed for muscle spasms. What changed:  You were already taking a medication with the same name, and this prescription was added. Make sure you understand how and when to take each.   oxyCODONE 5 MG immediate release tablet Commonly known as:  Oxy IR/ROXICODONE Take 1-2 tablets (5-10 mg total) by mouth  every 4 (four) hours as needed for breakthrough pain.   PROAIR HFA 108 (90 Base) MCG/ACT inhaler Generic drug:  albuterol Inhale 2 puffs into the lungs every 6 (six) hours as needed for wheezing.   valACYclovir 500 MG tablet Commonly known as:  VALTREX Take 1 tablet (500 mg total) by mouth 2 (two) times daily. What changed:  when to take this  reasons to take this            Durable Medical Equipment        Start     Ordered   12/25/16 1603  DME Walker rolling  Once    Question:  Patient needs a walker to treat with the  following condition  Answer:  S/P lumbar fusion   12/25/16 1602   12/25/16 1603  DME 3 n 1  Once     12/25/16 1602       Signed: Dorian Heckle, MD 12/27/2016, 7:43 AM

## 2016-12-27 NOTE — Progress Notes (Signed)
Subjective: Patient reports doing well. Pain improving.  Objective: Vital signs in last 24 hours: Temp:  [97.4 F (36.3 C)-99.3 F (37.4 C)] 98.9 F (37.2 C) (07/21 0513) Pulse Rate:  [65-74] 72 (07/21 0513) Resp:  [18-20] 20 (07/21 0513) BP: (112-151)/(40-71) 124/58 (07/21 0513) SpO2:  [96 %-100 %] 98 % (07/21 0513)  Intake/Output from previous day: No intake/output data recorded. Intake/Output this shift: No intake/output data recorded.  Physical Exam: Strength full.  Dressing CDI.  Lab Results:  Recent Labs  12/24/16 1148  WBC 7.5  HGB 13.0  HCT 38.8  PLT 217   BMET  Recent Labs  12/24/16 1148  NA 139  K 3.7  CL 106  CO2 28  GLUCOSE 100*  BUN 18  CREATININE 0.78  CALCIUM 9.7    Studies/Results: Dg Lumbar Spine 2-3 Views  Result Date: 12/25/2016 CLINICAL DATA:  Lumbar spine surgery . EXAM: DG C-ARM 61-120 MIN; LUMBAR SPINE - 2-3 VIEW COMPARISON:  CT 11/13/2016. FINDINGS: Lumbar spine numbered as per prior CT. L3 through L5 posterior and interbody fusion. Hardware intact. Anatomic alignment . IMPRESSION: L3 through L5 posterior and interbody fusion. Hardware intact. Anatomic alignment. Electronically Signed   By: Maisie Fushomas  Register   On: 12/25/2016 12:19   Dg C-arm 61-120 Min  Result Date: 12/25/2016 CLINICAL DATA:  Lumbar spine surgery . EXAM: DG C-ARM 61-120 MIN; LUMBAR SPINE - 2-3 VIEW COMPARISON:  CT 11/13/2016. FINDINGS: Lumbar spine numbered as per prior CT. L3 through L5 posterior and interbody fusion. Hardware intact. Anatomic alignment . IMPRESSION: L3 through L5 posterior and interbody fusion. Hardware intact. Anatomic alignment. Electronically Signed   By: Maisie Fushomas  Register   On: 12/25/2016 12:19    Assessment/Plan: Doing well.  Mobilize with PT today and then Discharge home.    LOS: 2 days    Dorian HeckleSTERN,Jovanie Verge D, MD 12/27/2016, 7:41 AM

## 2016-12-27 NOTE — Progress Notes (Signed)
Patient discharged home with family. IV removed. Discharge information given. Patient questions asked and answered. 3 in 1 and rolling walker provided by DME. Patient transported from unit via wheelchair with staff. Lawson RadarHeather M Adeana Grilliot

## 2016-12-27 NOTE — Progress Notes (Signed)
Physical Therapy Treatment Patient Details Name: Stephanie Gonzalez MRN: [id number] DOB: May 15, 1941 Today's Date: 12/27/2016    History of Present Illness Pt is a 76 yo female admitted for a lumbar laminectomy at L3-4, L4-5 with a fixation, posterior arthrodesis and auto& allograft. PMH significant for Asthma, DM2, vertigo, kidney stones, HLD, HTN, RLS.     PT Comments    Pt continues to make steady progress with mobility. .Patient safe to D/C from a mobility standpoint based on progression towards goals set on PT eval.    Follow Up Recommendations  No PT follow up     Equipment Recommendations  None recommended by PT       Precautions / Restrictions Precautions Precautions: Back Precaution Comments: pt able to recall 3/3 back precautions, needs cues to adhere to them with mobility Required Braces or Orthoses: Spinal Brace Spinal Brace: Lumbar corset;Applied in sitting position    Mobility  Bed Mobility Overal bed mobility: Needs Assistance           Sit to sidelying: Min guard General bed mobility comments: cues for log roll technique to lie down on side with bed flat and no rails used  Transfers Overall transfer level: Needs assistance Equipment used: Rolling walker (2 wheeled) Transfers: Sit to/from Stand Sit to Stand: Min guard         General transfer comment: cues for safe hand placement with transfers  Ambulation/Gait Ambulation/Gait assistance: Supervision Ambulation Distance (Feet): 200 Feet Assistive device: Rolling walker (2 wheeled) Gait Pattern/deviations: Step-through pattern;Decreased stride length Gait velocity: decreased Gait velocity interpretation: Below normal speed for age/gender General Gait Details: cues to stay closer to RW with gait, especially with turns       Cognition Arousal/Alertness: Awake/alert Behavior During Therapy: WFL for tasks assessed/performed Overall Cognitive Status: Within Functional Limits for tasks assessed                  Pertinent Vitals/Pain Pain Assessment: 0-10 Pain Score: 6  Pain Location: incision Pain Descriptors / Indicators: Aching;Sore;Operative site guarding Pain Intervention(s): Limited activity within patient's tolerance;Monitored during session;Premedicated before session;Repositioned     PT Goals (current goals can now be found in the care plan section) Acute Rehab PT Goals Patient Stated Goal: to get back to traveling PT Goal Formulation: With patient Time For Goal Achievement: 01/02/17 Potential to Achieve Goals: Good Progress towards PT goals: Progressing toward goals    Frequency    Min 5X/week      PT Plan Current plan remains appropriate    AM-PAC PT "6 Clicks" Daily Activity  Outcome Measure  Difficulty turning over in bed (including adjusting bedclothes, sheets and blankets)?: A Little Difficulty moving from lying on back to sitting on the side of the bed? : A Little Difficulty sitting down on and standing up from a chair with arms (e.g., wheelchair, bedside commode, etc,.)?: None Help needed moving to and from a bed to chair (including a wheelchair)?: None Help needed walking in hospital room?: A Little Help needed climbing 3-5 steps with a railing? : A Lot 6 Click Score: 19    End of Session Equipment Utilized During Treatment: Gait belt;Back brace Activity Tolerance: Patient tolerated treatment well;No increased pain Patient left: in bed;with call bell/phone within reach Nurse Communication: Mobility status PT Visit Diagnosis: Unsteadiness on feet (R26.81);Difficulty in walking, not elsewhere classified (R26.2)     Time: 0865-7846 PT Time Calculation (min) (ACUTE ONLY): 19 min  Charges:  $Gait Training: 8-22 mins  Sallyanne KusterKathy Nykerria Macconnell, PTA, CLT Acute Rehab Services [phone]- [phone] 12/27/16, 9:05 AM   Sallyanne KusterBury, Briahnna Harries 12/27/2016, 9:04 AM

## 2016-12-27 NOTE — Care Management Note (Signed)
Case Management Note  Patient Details  Name: Stephanie Gonzalez MRN: [id number] Date of Birth: 1940-08-04  Subjective/Objective:  Pt to be discharged with DME. CM has made Reggie, [facility] aware [facility] needs.                   Action/Plan: CM will sign off for now but will be available should additional discharge needs arise or disposition change.    Expected Discharge Date:  12/27/16               Expected Discharge Plan:  Home/Self Care  In-House Referral:  NA  Discharge planning Services  CM Consult  Post Acute Care Choice:  Durable Medical Equipment Choice offered to:     DME Arranged:  3-N-1, Walker rolling DME Agency:  [facility]  [facility] Agency:     Status of Service:  Completed, signed off  If discussed at Long Length of Stay Meetings, dates discussed:    Additional Comments:  Yvone NeuCrutchfield, Shedrick Sarli M, RN 12/27/2016, 4:07 PM

## 2016-12-29 ENCOUNTER — Other Ambulatory Visit: Payer: Self-pay | Admitting: Internal Medicine

## 2016-12-29 ENCOUNTER — Telehealth: Payer: Self-pay | Admitting: Internal Medicine

## 2016-12-29 NOTE — Telephone Encounter (Signed)
error 

## 2016-12-30 NOTE — Telephone Encounter (Signed)
LMTCB

## 2016-12-31 ENCOUNTER — Other Ambulatory Visit: Payer: Self-pay | Admitting: Internal Medicine

## 2016-12-31 MED ORDER — METFORMIN HCL 500 MG PO TABS: 500.0000 mg | ORAL_TABLET | Freq: Two times a day (BID) | ORAL | 2 refills | Status: DC

## 2017-01-01 NOTE — Telephone Encounter (Signed)
Attempted to reach pt at home number several times, it rings busy each time. Called pt's cell phone and LMTCB.

## 2017-03-17 ENCOUNTER — Ambulatory Visit: Payer: Medicare Other

## 2017-03-24 ENCOUNTER — Ambulatory Visit (INDEPENDENT_AMBULATORY_CARE_PROVIDER_SITE_OTHER): Payer: Medicare Other | Admitting: *Deleted

## 2017-03-24 DIAGNOSIS — Z23 Encounter for immunization: Secondary | ICD-10-CM | POA: Diagnosis not present

## 2017-04-03 ENCOUNTER — Telehealth: Payer: Self-pay | Admitting: Internal Medicine

## 2017-04-03 MED ORDER — CLONAZEPAM 1 MG PO TABS: 1.0000 mg | ORAL_TABLET | Freq: Every evening | ORAL | 0 refills | Status: DC | PRN

## 2017-04-03 NOTE — Telephone Encounter (Signed)
Medication was called in to the pharmacy [id number] with 0 refills. Pt has not had OV in 6 months, needs OV for further refills.

## 2017-04-03 NOTE — Telephone Encounter (Signed)
Pt request refill  clonazePAM (KLONOPIN) 1 MG tablet  Pt states she called the pharmacy earlier this week, and thought it would be there yesterday.  But I do not see where we received.  Pt states she is out and needs asap because she did not sleep at all last night.  [id number] - [place], [place] - [address]

## 2017-04-03 NOTE — Telephone Encounter (Signed)
Several attempts to call pt, line is busy

## 2017-06-23 ENCOUNTER — Other Ambulatory Visit: Payer: Self-pay | Admitting: Internal Medicine

## 2017-06-23 DIAGNOSIS — Z1231 Encounter for screening mammogram for malignant neoplasm of breast: Secondary | ICD-10-CM

## 2017-07-01 ENCOUNTER — Other Ambulatory Visit: Payer: Self-pay | Admitting: Internal Medicine

## 2017-07-06 ENCOUNTER — Telehealth: Payer: Self-pay | Admitting: Internal Medicine

## 2017-07-06 DIAGNOSIS — Z79891 Long term (current) use of opiate analgesic: Secondary | ICD-10-CM | POA: Insufficient documentation

## 2017-07-06 NOTE — Telephone Encounter (Signed)
Copied from CRM [phone]#[id number]. Topic: General - Other >> Jul 06, 2017 12:01 PM Lelon FrohlichGolden, Hershal Eriksson, [place] wrote: Reason for CRM: Davina from [facility] called and wanted to make sure that the office was aware of the fact that the pt is on clonazepam and an oipoid hydrocodone please contact pharmacy at [phone]

## 2017-07-07 NOTE — Telephone Encounter (Signed)
Sir please see below message. Please advise. 

## 2017-07-08 NOTE — Telephone Encounter (Signed)
Pharmacy was called and the ok to fill medication was given to the pharmacy tech per Dr Tawanna Coolerodd.

## 2017-07-08 NOTE — Telephone Encounter (Signed)
okay

## 2017-07-28 ENCOUNTER — Ambulatory Visit
Admission: RE | Admit: 2017-07-28 | Discharge: 2017-07-28 | Disposition: A | Discharge status: Home / Self Care | Payer: Medicare Other | Source: Ambulatory Visit | Attending: Internal Medicine | Admitting: Internal Medicine

## 2017-07-28 DIAGNOSIS — Z1231 Encounter for screening mammogram for malignant neoplasm of breast: Secondary | ICD-10-CM

## 2017-08-03 NOTE — Progress Notes (Addendum)
Subjective:   Stephanie Gonzalez is a 77 y.o. female who presents for Medicare Annual (Subsequent) preventive examination.  Reports health as  DM 2  Diet Chol/hdl 5; hdl 36 and trig 395 (educated on lowering triglycerides)  DM neg Big breakfast and may have muffin some days Bakes on occasion   Rechecking statins with blood draw today Has lost weight from 225 to 165 but gained 10 lbs at the holidays Was 174 today   Exercise Regular exercise- 2018 Walking all the time; does not exercise due to back This year improved due to back surgery and spinal stimulator  Up and down stairs 2 to 3 times a day doing laundry [place] and goes [place] the mailbox   Health Maintenance Due  Topic Date Due  . OPHTHALMOLOGY EXAM  10/09/2016  . HEMOGLOBIN A1C  06/26/2017  . URINE MICROALBUMIN  07/01/2017  . FOOT EXAM  07/07/2017   Diabetic eye exam 12/2015- had one last year and is not due this year  Colonoscopy [id number] and due 09/2019 Mammogram 07/2017 -  Dexa 07/2016   -2.1 Educated on bone loss    fup shigrix; educated on this as well        Objective:     Vitals: BP 140/78   Ht 5' 4.5" (1.638 m)   Wt 174 lb (78.9 kg)   BMI 29.41 kg/m   Body mass index is 29.41 kg/m.  Advanced Directives 08/04/2017 12/25/2016 12/24/2016 07/01/2016 07/10/2014  Does Patient Have a Medical Advance Directive? No No No No No  Would patient like information on creating a medical advance directive? - No - Patient declined No - Patient declined - No - patient declined information   Advanced Directive; Reviewed advanced directive and agreed to receipt of information and discussion.  Focused face to face x  20 minutes discussing HCPOA and Living will and reviewed all the questions in the [facility] forms. The patient voices understanding of HCPOA; LW reviewed and information provided on each question. Educated on how to revoke this HCPOA or LW at any time.   Also  discussed life prolonging measures  (given a few examples) and where she could choose to initiate or not;  the ability to given the HCPOA power to change her living will or not if she cannot speak for herself; as well as finalizing the will by 2 unrelated witnesses and notary.  Will call for questions and given information on [facility] pastoral department for further assistance.     Tobacco Social History   Tobacco Use  Smoking Status Never Smoker  Smokeless Tobacco Never Used     Counseling given: Yes   Clinical Intake:   Past Medical History:  Diagnosis Date  . ASTHMA 10/06/2007  . Bronchial asthma   . BURSITIS, RIGHT HIP 02/04/2010  . DIABETES MELLITUS, TYPE II 11/01/2009  . Family history of adverse reaction to anesthesia    Mother had n/v  . H/O vertigo   . History of kidney stones    passed on her own  . HYPERLIPIDEMIA 10/06/2007  . HYPERTENSION 10/06/2007  . Pneumonia   . Restless leg syndrome    Past Surgical History:  Procedure Laterality Date  . ABDOMINAL HYSTERECTOMY    . APPENDECTOMY    . CARDIAC CATHETERIZATION  1992   ? 90's  . CHOLECYSTECTOMY    . COLONOSCOPY    . KNEE ARTHROSCOPY Right   . SPINAL CORD STIMULATOR INSERTION N/A 07/13/2014  Procedure: LUMBAR SPINAL CORD STIMULATOR INSERTION;  Surgeon: Venita Lick, MD;  Location: [facility] OR;  Service: Orthopedics;  Laterality: N/A;  . TONSILLECTOMY    . TUBAL LIGATION     Family History  Problem Relation Age of Onset  . Alzheimer's disease Mother   . Cancer Father   . Alcoholism Father    Social History   Socioeconomic History  . Marital status: Married    Spouse name: Not on file  . Number of children: Not on file  . Years of education: Not on file  . Highest education level: Not on file  Social Needs  . Financial resource strain: Not on file  . Food insecurity - worry: Not on file  . Food insecurity - inability: Not on file  . Transportation needs - medical: Not on file  . Transportation needs - non-medical: Not on file    Occupational History  . Not on file  Tobacco Use  . Smoking status: Never Smoker  . Smokeless tobacco: Never Used  Substance and Sexual Activity  . Alcohol use: No  . Drug use: No  . Sexual activity: Not on file  Other Topics Concern  . Not on file  Social History Narrative  . Not on file    Outpatient Encounter Medications as of 08/04/2017  Medication Sig  . albuterol (PROAIR HFA) 108 (90 BASE) MCG/ACT inhaler Inhale 2 puffs into the lungs every 6 (six) hours as needed for wheezing.   Marland Kitchen amLODipine (NORVASC) 5 MG tablet TAKE 1 TABLET BY MOUTH  DAILY (Patient taking differently: TAKE 5 MG BY MOUTH  DAILY)  . atorvastatin (LIPITOR) 40 MG tablet TAKE 1 TABLET BY MOUTH  DAILY (Patient taking differently: TAKE 40 MG BY MOUTH  DAILY)  . b complex vitamins tablet Take 1 tablet by mouth daily.  . clonazePAM (KLONOPIN) 1 MG tablet TAKE 1 TABLET AT BEDTIME AS NEEDED. NEED OFFICE VISIT BEFORE FURTHER REFILLS  . glucose blood (FREESTYLE LITE) test strip USE TO CHECK BLOOD SUGAR DAILY AND PRN  . HYDROcodone-acetaminophen (NORCO) 10-325 MG tablet Take 0.5 tablets by mouth 2 (two) times daily as needed for moderate pain. (Patient taking differently: Take 0.5 tablets by mouth every 6 (six) hours as needed for moderate pain. )  . Lancets (FREESTYLE) lancets USE TO CHECK BLOOD SUGAR DAILY AND PRN  . LYSINE PO Take 1 tablet by mouth daily as needed (for cold sores).   . metFORMIN (GLUCOPHAGE) 500 MG tablet Take 1 tablet (500 mg total) by mouth 2 (two) times daily with a meal.  . valACYclovir (VALTREX) 500 MG tablet Take 1 tablet (500 mg total) by mouth 2 (two) times daily. (Patient taking differently: Take 500 mg by mouth 2 (two) times daily as needed (for cold sores). )  . [DISCONTINUED] HYDROcodone-homatropine (HYCODAN) 5-1.5 MG/5ML syrup Take 5 mLs by mouth every 6 (six) hours as needed for cough.  . [DISCONTINUED] meclizine (ANTIVERT) 25 MG tablet Take 1 tablet (25 mg total) by mouth 3 (three) times  daily as needed for dizziness.  . [DISCONTINUED] methocarbamol (ROBAXIN) 500 MG tablet Take 500 mg by mouth 3 (three) times daily as needed for muscle spasms.  . [DISCONTINUED] methocarbamol (ROBAXIN) 500 MG tablet Take 1 tablet (500 mg total) by mouth every 6 (six) hours as needed for muscle spasms. (Patient not taking: Reported on 08/04/2017)  . [DISCONTINUED] oxyCODONE (OXY IR/ROXICODONE) 5 MG immediate release tablet Take 1-2 tablets (5-10 mg total) by mouth every 4 (four) hours as needed for  breakthrough pain.   No facility-administered encounter medications on file as of 08/04/2017.     Activities of Daily Living In your present state of health, do you have any difficulty performing the following activities: 08/04/2017 12/25/2016  Hearing? Y N  Comment left ear -  Vision? N N  Difficulty concentrating or making decisions? N N  Walking or climbing stairs? N Y  Comment better since surgery  -  Dressing or bathing? N N  Doing errands, shopping? N N  Preparing Food and eating ? N -  Using the Toilet? N -  In the past six months, have you accidently leaked urine? N -  Do you have problems with loss of bowel control? N -  Managing your Medications? N -  Managing your Finances? N -  Some recent data might be hidden    Patient Care Team: Gordy Savers, MD as PCP - General    Assessment:   This is a routine wellness examination for Stephanie Gonzalez.  Exercise Activities and Dietary recommendations Current Exercise Habits: Home exercise routine, Time (Minutes): 45, Frequency (Times/Week): 4, Weekly Exercise (Minutes/Week): 180, Intensity: Moderate(house work, Geophysical data processor, Restaurant manager, fast food )  Goals    . Patient Stated     Less back pain and weight bearing exercise     . Weight (lb) < 165 lb (74.8 kg)     Continue your walking Drinking more water        Fall Risk Fall Risk  08/04/2017 07/01/2016 06/13/2014 04/20/2013  Falls in the past year? No No No No     Depression Screen PHQ 2/9 Scores  08/04/2017 07/01/2016 06/13/2014 04/20/2013  PHQ - 2 Score 0 0 1 0     Cognitive Function MMSE - Mini Mental State Exam 08/04/2017 07/01/2016  Not completed: (No Data) (No Data)    mother had Alz   no issues at present due to no failures of independent living   Immunization History  Administered Date(s) Administered  . Influenza Split 04/16/2012  . Influenza Whole 03/24/2007, 03/09/2008, 03/06/2009, 02/04/2010, 02/18/2011  . Influenza, High Dose Seasonal PF 03/26/2016, 03/24/2017  . Influenza,inj,Quad PF,6+ Mos 04/04/2013, 04/10/2014, 03/09/2015  . Pneumococcal Conjugate-13 06/13/2014  . Pneumococcal Polysaccharide-23 04/02/2010  . Td 04/02/2010  . Zoster 02/18/2011     Screening Tests Health Maintenance  Topic Date Due  . OPHTHALMOLOGY EXAM  10/09/2016  . HEMOGLOBIN A1C  06/26/2017  . URINE MICROALBUMIN  07/01/2017  . FOOT EXAM  07/07/2017  . COLONOSCOPY  09/08/2019  . TETANUS/TDAP  04/02/2020  . INFLUENZA VACCINE  Completed  . DEXA SCAN  Completed  . PNA vac Low Risk Adult  Completed         Plan:      PCP Notes   Health Maintenance To call My eye doctor in [facility] for last eye exam and will have them fax report   Reviewed dexa result and provided education on calcium and Vit d  Educated regarding shingrix  Educated regarding advanced directives   Educated regarding triglycerides; in process of checking this year  Abnormal Screens  Triglycerides (rechecking) BI 29; feels good with weight at present   Referrals  Resources given for hearing if needed    Patient concerns; None   Nurse Concerns; As noted   Next PCP apt In to see Dr. Kirtland Bouchard today       I have personally reviewed and noted the following in the patient's chart:   . Medical and social history . Use of alcohol,  tobacco or illicit drugs  . Current medications and supplements . Functional ability and status . Nutritional status . Physical activity . Advanced directives . List of  other physicians . Hospitalizations, surgeries, and ER visits in previous 12 months . Vitals . Screenings to include cognitive, depression, and falls . Referrals and appointments  In addition, I have reviewed and discussed with patient certain preventive protocols, quality metrics, and best practice recommendations. A written personalized care plan for preventive services as well as general preventive health recommendations were provided to patient.     Idrees Quam, RN  08/04/2017  Results of this patient's subsequent Medicare wellness visit reviewed and agree with findings.  Patient seen earlier today for clinical annual exam.  Will review updated laboratory studies  Rogelia Boga

## 2017-08-04 ENCOUNTER — Ambulatory Visit (INDEPENDENT_AMBULATORY_CARE_PROVIDER_SITE_OTHER): Payer: Medicare Other | Admitting: Internal Medicine

## 2017-08-04 ENCOUNTER — Ambulatory Visit (INDEPENDENT_AMBULATORY_CARE_PROVIDER_SITE_OTHER): Payer: Medicare Other

## 2017-08-04 ENCOUNTER — Encounter: Payer: Self-pay | Admitting: Internal Medicine

## 2017-08-04 ENCOUNTER — Ambulatory Visit: Payer: Medicare Other

## 2017-08-04 VITALS — BP 140/78 | Ht 64.5 in | Wt 174.0 lb

## 2017-08-04 VITALS — BP 140/78 | HR 65 | Temp 97.9°F | Ht 64.5 in | Wt 174.0 lb

## 2017-08-04 DIAGNOSIS — I10 Essential (primary) hypertension: Secondary | ICD-10-CM | POA: Diagnosis not present

## 2017-08-04 DIAGNOSIS — Z0000 Encounter for general adult medical examination without abnormal findings: Secondary | ICD-10-CM

## 2017-08-04 DIAGNOSIS — G8929 Other chronic pain: Secondary | ICD-10-CM

## 2017-08-04 DIAGNOSIS — J4520 Mild intermittent asthma, uncomplicated: Secondary | ICD-10-CM | POA: Diagnosis not present

## 2017-08-04 DIAGNOSIS — E785 Hyperlipidemia, unspecified: Secondary | ICD-10-CM | POA: Diagnosis not present

## 2017-08-04 DIAGNOSIS — E119 Type 2 diabetes mellitus without complications: Secondary | ICD-10-CM

## 2017-08-04 LAB — COMPREHENSIVE METABOLIC PANEL
ALT: 8 U/L (ref 0–35)
AST: 13 U/L (ref 0–37)
Albumin: 3.8 g/dL (ref 3.5–5.2)
Alkaline Phosphatase: 100 U/L (ref 39–117)
BUN: 15 mg/dL (ref 6–23)
CO2: 32 meq/L (ref 19–32)
Calcium: 9.5 mg/dL (ref 8.4–10.5)
Chloride: 101 mEq/L (ref 96–112)
Creatinine, Ser: 0.69 mg/dL (ref 0.40–1.20)
GFR: 88.07 mL/min (ref >60.00)
GLUCOSE: 76 mg/dL (ref 70–99)
POTASSIUM: 3.6 meq/L (ref 3.5–5.1)
Sodium: 139 mEq/L (ref 135–145)
TOTAL PROTEIN: 7 g/dL (ref 6.0–8.3)
Total Bilirubin: 0.8 mg/dL (ref 0.2–1.2)

## 2017-08-04 LAB — MICROALBUMIN / CREATININE URINE RATIO
Creatinine,U: 189.5 mg/dL
Microalb Creat Ratio: 1.4 mg/g (ref 0.0–30.0)
Microalb, Ur: 2.7 mg/dL — ABNORMAL HIGH (ref 0.0–1.9)

## 2017-08-04 LAB — LIPID PANEL
CHOL/HDL RATIO: 5
Cholesterol: 194 mg/dL (ref 0–200)
HDL: 36.1 mg/dL — AB (ref >39.00)
NONHDL: 158.35
TRIGLYCERIDES: 233 mg/dL — AB (ref 0.0–149.0)
VLDL: 46.6 mg/dL — ABNORMAL HIGH (ref 0.0–40.0)

## 2017-08-04 LAB — CBC WITH DIFFERENTIAL/PLATELET
BASOS PCT: 0.4 % (ref 0.0–3.0)
Basophils Absolute: 0 10*3/uL (ref 0.0–0.1)
Eosinophils Absolute: 0.2 10*3/uL (ref 0.0–0.7)
Eosinophils Relative: 2.1 % (ref 0.0–5.0)
HEMATOCRIT: 38.3 % (ref 36.0–46.0)
Hemoglobin: 13.2 g/dL (ref 12.0–15.0)
LYMPHS ABS: 3.3 10*3/uL (ref 0.7–4.0)
LYMPHS PCT: 42.2 % (ref 12.0–46.0)
MCHC: 34.6 g/dL (ref 30.0–36.0)
MCV: 83.6 fl (ref 78.0–100.0)
MONOS PCT: 6.8 % (ref 3.0–12.0)
Monocytes Absolute: 0.5 10*3/uL (ref 0.1–1.0)
NEUTROS ABS: 3.7 10*3/uL (ref 1.4–7.7)
NEUTROS PCT: 48.5 % (ref 43.0–77.0)
PLATELETS: 202 10*3/uL (ref 150.0–400.0)
RBC: 4.58 Mil/uL (ref 3.87–5.11)
RDW: 13.2 % (ref 11.5–15.5)
WBC: 7.7 10*3/uL (ref 4.0–10.5)

## 2017-08-04 LAB — TSH: TSH: 3.54 u[IU]/mL (ref 0.35–4.50)

## 2017-08-04 LAB — HEMOGLOBIN A1C: Hgb A1c MFr Bld: 5.1 % (ref 4.6–6.5)

## 2017-08-04 LAB — LDL CHOLESTEROL, DIRECT: Direct LDL: 95 mg/dL

## 2017-08-04 NOTE — Patient Instructions (Addendum)
Stephanie Gonzalez , Thank you for taking time to come for your Medicare Wellness Visit. I appreciate your ongoing commitment to your health goals. Please review the following plan we discussed and let me know if I can assist you in the future.   Shingrix is a vaccine for the prevention of Shingles in Adults 50 and older.  If you are on Medicare, you can request a prescription from your doctor to be filled at a pharmacy.  Please check with your benefits regarding applicable copays or out of pocket expenses.  The Shingrix is given in 2 vaccines approx 8 weeks apart. You must receive the 2nd dose prior to 6 months from receipt of the first.   You has live shingles in 2012   Will make an eye apt as needed annually Stephanie Gonzalez will call My eye doctor in [place] to try and get your reports   [facility] - can assist with hearing aid x 1  No reviews  Stephanie Gonzalez  [phone]  [phone]  [email or web address]  Stephanie Gonzalez will call  Stephanie Gonzalez and try to get your eye report  Recommendations for Dexa Scan Female over the age of 98 Man age 41 or older If you broke a bone past the age of 67 Women menopausal age with risk factors (thin frame; smoker; hx of fx ) Post menopausal women under the age of 8 with risk factors A man age 65 to 58 with risk factors Other: Spine xray that is showing break of bone loss Back pain with possible break Height loss of 1/2 inch or more within one year Total loss in height of 1.5 inches from your original height  Calcium '1200mg'$  with Vit D 800u per day; more as directed by physician Strength building exercises discussed; can include walking; housework; small weights or stretch bands; silver sneakers if access to the [facility]  Please visit the osteoporosis foundation.org for up to date recommendations     These are the goals we  discussed: Goals    . Weight (lb) < 165 lb (74.8 kg)     Continue your walking Drinking more water        This is a list of the screening recommended for you and due dates:  Health Maintenance  Topic Date Due  . Eye exam for diabetics  10/09/2016  . Hemoglobin A1C  06/26/2017  . Urine Protein Check  07/01/2017  . Complete foot exam   07/07/2017  . Colon Cancer Screening  09/08/2019  . Tetanus Vaccine  04/02/2020  . Flu Shot  Completed  . DEXA scan (bone density measurement)  Completed  . Pneumonia vaccines  Completed     Bone Densitometry Bone densitometry is an imaging test that uses a special X-ray to measure the amount of calcium and other minerals in your bones (bone density). This test is also known as a bone mineral density test or dual-energy X-ray absorptiometry (DXA). The test can measure bone density at your hip and your spine. It is similar to having a regular X-ray. You may have this test to:  Diagnose a condition that causes weak or thin bones (osteoporosis).  Predict your risk of a broken bone (fracture).  Determine how well osteoporosis treatment is working.  Tell a health care provider about:  Any allergies you have.  All medicines you are taking, including vitamins, herbs, eye drops, creams, and over-the-counter medicines.  Any problems you or family members have had  with anesthetic medicines.  Any blood disorders you have.  Any surgeries you have had.  Any medical conditions you have.  Possibility of pregnancy.  Any other medical test you had within the previous 14 days that used contrast material. What are the risks? Generally, this is a safe procedure. However, problems can occur and may include the following:  This test exposes you to a very small amount of radiation.  The risks of radiation exposure may be greater to unborn children.  What happens before the procedure?  Do not take any calcium supplements for 24 hours before having the  test. You can otherwise eat and drink what you usually do.  Take off all metal jewelry, eyeglasses, dental appliances, and any other metal objects. What happens during the procedure?  You may lie on an exam table. There will be an X-ray generator below you and an imaging device above you.  Other devices, such as boxes or braces, may be used to position your body properly for the scan.  You will need to lie still while the machine slowly scans your body.  The images will show up on a computer monitor. What happens after the procedure? You may need more testing at a later time. This information is not intended to replace advice given to you by your health care provider. Make sure you discuss any questions you have with your health care provider. Document Released: 06/17/2004 Document Revised: 11/01/2015 Document Reviewed: 11/03/2013 Elsevier Interactive Patient Education  2018 [place] in the Home Falls can cause injuries. They can happen to people of all ages. There are many things you can do to make your home safe and to help prevent falls. What can I do on the outside of my home?  Regularly fix the edges of walkways and driveways and fix any cracks.  Remove anything that might make you trip as you walk through a door, such as a raised step or threshold.  Trim any bushes or trees on the path to your home.  Use bright outdoor lighting.  Clear any walking paths of anything that might make someone trip, such as rocks or tools.  Regularly check to see if handrails are loose or broken. Make sure that both sides of any steps have handrails.  Any raised decks and porches should have guardrails on the edges.  Have any leaves, snow, or ice cleared regularly.  Use sand or salt on walking paths during winter.  Clean up any spills in your garage right away. This includes oil or grease spills. What can I do in the bathroom?  Use night lights.  Install grab  bars by the toilet and in the tub and shower. Do not use towel bars as grab bars.  Use non-skid mats or decals in the tub or shower.  If you need to sit down in the shower, use a plastic, non-slip stool.  Keep the floor dry. Clean up any water that spills on the floor as soon as it happens.  Remove soap buildup in the tub or shower regularly.  Attach bath mats securely with double-sided non-slip rug tape.  Do not have throw rugs and other things on the floor that can make you trip. What can I do in the bedroom?  Use night lights.  Make sure that you have a light by your bed that is easy to reach.  Do not use any sheets or blankets that are too big for your bed. They should  not hang down onto the floor.  Have a firm chair that has side arms. You can use this for support while you get dressed.  Do not have throw rugs and other things on the floor that can make you trip. What can I do in the kitchen?  Clean up any spills right away.  Avoid walking on wet floors.  Keep items that you use a lot in easy-to-reach places.  If you need to reach something above you, use a strong step stool that has a grab bar.  Keep electrical cords out of the way.  Do not use floor polish or wax that makes floors slippery. If you must use wax, use non-skid floor wax.  Do not have throw rugs and other things on the floor that can make you trip. What can I do with my stairs?  Do not leave any items on the stairs.  Make sure that there are handrails on both sides of the stairs and use them. Fix handrails that are broken or loose. Make sure that handrails are as long as the stairways.  Check any carpeting to make sure that it is firmly attached to the stairs. Fix any carpet that is loose or worn.  Avoid having throw rugs at the top or bottom of the stairs. If you do have throw rugs, attach them to the floor with carpet tape.  Make sure that you have a light switch at the top of the stairs and the  bottom of the stairs. If you do not have them, ask someone to add them for you. What else can I do to help prevent falls?  Wear shoes that: ? Do not have high heels. ? Have rubber bottoms. ? Are comfortable and fit you well. ? Are closed at the toe. Do not wear sandals.  If you use a stepladder: ? Make sure that it is fully opened. Do not climb a closed stepladder. ? Make sure that both sides of the stepladder are locked into place. ? Ask someone to hold it for you, if possible.  Clearly mark and make sure that you can see: ? Any grab bars or handrails. ? First and last steps. ? Where the edge of each step is.  Use tools that help you move around (mobility aids) if they are needed. These include: ? Canes. ? Walkers. ? Scooters. ? Crutches.  Turn on the lights when you go into a dark area. Replace any light bulbs as soon as they burn out.  Set up your furniture so you have a clear path. Avoid moving your furniture around.  If any of your floors are uneven, fix them.  If there are any pets around you, be aware of where they are.  Review your medicines with your doctor. Some medicines can make you feel dizzy. This can increase your chance of falling. Ask your doctor what other things that you can do to help prevent falls. This information is not intended to replace advice given to you by your health care provider. Make sure you discuss any questions you have with your health care provider. Document Released: 03/22/2009 Document Revised: 11/01/2015 Document Reviewed: 06/30/2014 Elsevier Interactive Patient Education  2018 [place] Maintenance, Female Adopting a healthy lifestyle and getting preventive care can go a long way to promote health and wellness. Talk with your health care provider about what schedule of regular examinations is right for you. This is a good chance for you to check in with  your provider about disease prevention and staying healthy. In  between checkups, there are plenty of things you can do on your own. Experts have done a lot of research about which lifestyle changes and preventive measures are most likely to keep you healthy. Ask your health care provider for more information. Weight and diet Eat a healthy diet  Be sure to include plenty of vegetables, fruits, low-fat dairy products, and lean protein.  Do not eat a lot of foods high in solid fats, added sugars, or salt.  Get regular exercise. This is one of the most important things you can do for your health. ? Most adults should exercise for at least 150 minutes each week. The exercise should increase your heart rate and make you sweat (moderate-intensity exercise). ? Most adults should also do strengthening exercises at least twice a week. This is in addition to the moderate-intensity exercise.  Maintain a healthy weight  Body mass index (BMI) is a measurement that can be used to identify possible weight problems. It estimates body fat based on height and weight. Your health care provider can help determine your BMI and help you achieve or maintain a healthy weight.  For females 45 years of age and older: ? A BMI below 18.5 is considered underweight. ? A BMI of 18.5 to 24.9 is normal. ? A BMI of 25 to 29.9 is considered overweight. ? A BMI of 30 and above is considered obese.  Watch levels of cholesterol and blood lipids  You should start having your blood tested for lipids and cholesterol at 76 years of age, then have this test every 5 years.  You may need to have your cholesterol levels checked more often if: ? Your lipid or cholesterol levels are high. ? You are older than 76 years of age. ? You are at high risk for heart disease.  Cancer screening Lung Cancer  Lung cancer screening is recommended for adults 32-56 years old who are at high risk for lung cancer because of a history of smoking.  A yearly low-dose CT scan of the lungs is recommended for  people who: ? Currently smoke. ? Have quit within the past 15 years. ? Have at least a 30-pack-year history of smoking. A pack year is smoking an average of one pack of cigarettes a day for 1 year.  Yearly screening should continue until it has been 15 years since you quit.  Yearly screening should stop if you develop a health problem that would prevent you from having lung cancer treatment.  Breast Cancer  Practice breast self-awareness. This means understanding how your breasts normally appear and feel.  It also means doing regular breast self-exams. Let your health care provider know about any changes, no matter how small.  If you are in your 20s or 30s, you should have a clinical breast exam (CBE) by a health care provider every 1-3 years as part of a regular health exam.  If you are 2 or older, have a CBE every year. Also consider having a breast X-ray (mammogram) every year.  If you have a family history of breast cancer, talk to your health care provider about genetic screening.  If you are at high risk for breast cancer, talk to your health care provider about having an MRI and a mammogram every year.  Breast cancer gene (BRCA) assessment is recommended for women who have family members with BRCA-related cancers. BRCA-related cancers include: ? Breast. ? Ovarian. ? Tubal. ? Peritoneal cancers.  Results of the assessment will determine the need for genetic counseling and BRCA1 and BRCA2 testing.  Cervical Cancer Your health care provider may recommend that you be screened regularly for cancer of the pelvic organs (ovaries, uterus, and vagina). This screening involves a pelvic examination, including checking for microscopic changes to the surface of your cervix (Pap test). You may be encouraged to have this screening done every 3 years, beginning at age 67.  For women ages 17-65, health care providers may recommend pelvic exams and Pap testing every 3 years, or they may  recommend the Pap and pelvic exam, combined with testing for human papilloma virus (HPV), every 5 years. Some types of HPV increase your risk of cervical cancer. Testing for HPV may also be done on women of any age with unclear Pap test results.  Other health care providers may not recommend any screening for nonpregnant women who are considered low risk for pelvic cancer and who do not have symptoms. Ask your health care provider if a screening pelvic exam is right for you.  If you have had past treatment for cervical cancer or a condition that could lead to cancer, you need Pap tests and screening for cancer for at least 20 years after your treatment. If Pap tests have been discontinued, your risk factors (such as having a new sexual partner) need to be reassessed to determine if screening should resume. Some women have medical problems that increase the chance of getting cervical cancer. In these cases, your health care provider may recommend more frequent screening and Pap tests.  Colorectal Cancer  This type of cancer can be detected and often prevented.  Routine colorectal cancer screening usually begins at 76 years of age and continues through 76 years of age.  Your health care provider may recommend screening at an earlier age if you have risk factors for colon cancer.  Your health care provider may also recommend using home test kits to check for hidden blood in the stool.  A small camera at the end of a tube can be used to examine your colon directly (sigmoidoscopy or colonoscopy). This is done to check for the earliest forms of colorectal cancer.  Routine screening usually begins at age 60.  Direct examination of the colon should be repeated every 5-10 years through 76 years of age. However, you may need to be screened more often if Gonzalez forms of precancerous polyps or small growths are found.  Skin Cancer  Check your skin from head to toe regularly.  Tell your health care  provider about any new moles or changes in moles, especially if there is a change in a mole's shape or color.  Also tell your health care provider if you have a mole that is larger than the size of a pencil eraser.  Always use sunscreen. Apply sunscreen liberally and repeatedly throughout the day.  Protect yourself by wearing long sleeves, pants, a wide-brimmed hat, and sunglasses whenever you are outside.  Heart disease, diabetes, and high blood pressure  High blood pressure causes heart disease and increases the risk of stroke. High blood pressure is more likely to develop in: ? People who have blood pressure in the high end of the normal range (130-139/85-89 mm Hg). ? People who are overweight or obese. ? People who are African American.  If you are 42-39 years of age, have your blood pressure checked every 3-5 years. If you are 77 years of age or older, have your blood pressure  checked every year. You should have your blood pressure measured twice-once when you are at a Gonzalez or clinic, and once when you are not at a Gonzalez or clinic. Record the average of the two measurements. To check your blood pressure when you are not at a Gonzalez or clinic, you can use: ? An automated blood pressure machine at a pharmacy. ? A home blood pressure monitor.  If you are between 64 years and 42 years old, ask your health care provider if you should take aspirin to prevent strokes.  Have regular diabetes screenings. This involves taking a blood sample to check your fasting blood sugar level. ? If you are at a normal weight and have a low risk for diabetes, have this test once every three years after 76 years of age. ? If you are overweight and have a high risk for diabetes, consider being tested at a younger age or more often. Preventing infection Hepatitis B  If you have a higher risk for hepatitis B, you should be screened for this virus. You are considered at high risk for hepatitis B  if: ? You were born in a country where hepatitis B is common. Ask your health care provider which countries are considered high risk. ? Your parents were born in a high-risk country, and you have not been immunized against hepatitis B (hepatitis B vaccine). ? You have HIV or AIDS. ? You use needles to inject street drugs. ? You live with someone who has hepatitis B. ? You have had sex with someone who has hepatitis B. ? You get hemodialysis treatment. ? You take certain medicines for conditions, including cancer, organ transplantation, and autoimmune conditions.  Hepatitis C  Blood testing is recommended for: ? Everyone born from [birth year] through 1964. ? Anyone with known risk factors for hepatitis C.  Sexually transmitted infections (STIs)  You should be screened for sexually transmitted infections (STIs) including gonorrhea and chlamydia if: ? You are sexually active and are younger than 76 years of age. ? You are older than 76 years of age and your health care provider tells you that you are at risk for this type of infection. ? Your sexual activity has changed since you were last screened and you are at an increased risk for chlamydia or gonorrhea. Ask your health care provider if you are at risk.  If you do not have HIV, but are at risk, it may be recommended that you take a prescription medicine daily to prevent HIV infection. This is called pre-exposure prophylaxis (PrEP). You are considered at risk if: ? You are sexually active and do not regularly use condoms or know the HIV status of your partner(s). ? You take drugs by injection. ? You are sexually active with a partner who has HIV.  Talk with your health care provider about whether you are at high risk of being infected with HIV. If you choose to begin PrEP, you should first be tested for HIV. You should then be tested every 3 months for as long as you are taking PrEP. Pregnancy  If you are premenopausal and you may become  pregnant, ask your health care provider about preconception counseling.  If you may become pregnant, take 400 to 800 micrograms (mcg) of folic acid every day.  If you want to prevent pregnancy, talk to your health care provider about birth control (contraception). Osteoporosis and menopause  Osteoporosis is a disease in which the bones lose minerals and strength with aging. This  can result in serious bone fractures. Your risk for osteoporosis can be identified using a bone density scan.  If you are 20 years of age or older, or if you are at risk for osteoporosis and fractures, ask your health care provider if you should be screened.  Ask your health care provider whether you should take a calcium or vitamin D supplement to lower your risk for osteoporosis.  Menopause may have certain physical symptoms and risks.  Hormone replacement therapy may reduce some of these symptoms and risks. Talk to your health care provider about whether hormone replacement therapy is right for you. Follow these instructions at home:  Schedule regular health, dental, and eye exams.  Stay current with your immunizations.  Do not use any tobacco products including cigarettes, chewing tobacco, or electronic cigarettes.  If you are pregnant, do not drink alcohol.  If you are breastfeeding, limit how much and how often you drink alcohol.  Limit alcohol intake to no more than 1 drink per day for nonpregnant women. One drink equals 12 ounces of beer, 5 ounces of wine, or 1 ounces of hard liquor.  Do not use street drugs.  Do not share needles.  Ask your health care provider for help if you need support or information about quitting drugs.  Tell your health care provider if you often feel depressed.  Tell your health care provider if you have ever been abused or do not feel safe at home. This information is not intended to replace advice given to you by your health care provider. Make sure you discuss any  questions you have with your health care provider. Document Released: 12/09/2010 Document Revised: 11/01/2015 Document Reviewed: 02/27/2015 Elsevier Interactive Patient Education  2018 [employer].   Osteoporosis Osteoporosis is the thinning and loss of density in the bones. Osteoporosis makes the bones more brittle, fragile, and likely to break (fracture). Over time, osteoporosis can cause the bones to become so weak that they fracture after a simple fall. The bones most likely to fracture are the bones in the hip, wrist, and spine. What are the causes? The exact cause is not known. What increases the risk? Anyone can develop osteoporosis. You may be at greater risk if you have a family history of the condition or have poor nutrition. You may also have a higher risk if you are:  Female.  55 years old or older.  A smoker.  Not physically active.  White or Asian.  Slender.  What are the signs or symptoms? A fracture might be the first sign of the disease, especially if it results from a fall or injury that would not usually cause a bone to break. Other signs and symptoms include:  Low back and neck pain.  Stooped posture.  Height loss.  How is this diagnosed? To make a diagnosis, your health care provider may:  Take a medical history.  Perform a physical exam.  Order tests, such as: ? A bone mineral density test. ? A dual-energy X-ray absorptiometry test.  How is this treated? The goal of osteoporosis treatment is to strengthen your bones to reduce your risk of a fracture. Treatment may involve:  Making lifestyle changes, such as: ? Eating a diet rich in calcium. ? Doing weight-bearing and muscle-strengthening exercises. ? Stopping tobacco use. ? Limiting alcohol intake.  Taking medicine to slow the process of bone loss or to increase bone density.  Monitoring your levels of calcium and vitamin D.  Follow these instructions at  home:  Include calcium and  vitamin D in your diet. Calcium is important for bone health, and vitamin D helps the body absorb calcium.  Perform weight-bearing and muscle-strengthening exercises as directed by your health care provider.  Do not use any tobacco products, including cigarettes, chewing tobacco, and electronic cigarettes. If you need help quitting, ask your health care provider.  Limit your alcohol intake.  Take medicines only as directed by your health care provider.  Keep all follow-up visits as directed by your health care provider. This is important.  Take precautions at home to lower your risk of falling, such as: ? Keeping rooms well lit and clutter free. ? Installing safety rails on stairs. ? Using rubber mats in the bathroom and other areas that are often wet or slippery. Get help right away if: You fall or injure yourself. This information is not intended to replace advice given to you by your health care provider. Make sure you discuss any questions you have with your health care provider. Document Released: 03/05/2005 Document Revised: 10/29/2015 Document Reviewed: 11/03/2013 Elsevier Interactive Patient Education  [employer].

## 2017-08-04 NOTE — Progress Notes (Signed)
Subjective:    Patient ID: Stephanie Gonzalez, female    DOB: 05-Aug-1940, 77 y.o.   MRN: [id number]  HPI  77 year old patient who is seen today for her annual examination. She is scheduled later today for a subsequent Medicare wellness visit.  She has a history of type 2 diabetes controlled on metformin therapy.  She has chronic low back pain and is followed by chronic pain management. She is also on a chronic stable dose of Klonopin for restless leg syndrome.  She has hypertension and dyslipidemia  Last colonoscopy 2011  She is doing quite well and seemed fairly infrequently. She has had a recent mammogram and is scheduled for follow-up eye examination soon.  Recent mammogram normal  Social history.  She has had some increase in situational stress due to the poor health of a sister with metastatic gynecologic cancer.  There have also been deaths in the family  Past Medical History:  Diagnosis Date  . ASTHMA 10/06/2007  . Bronchial asthma   . BURSITIS, RIGHT HIP 02/04/2010  . DIABETES MELLITUS, TYPE II 11/01/2009  . Family history of adverse reaction to anesthesia    Mother had n/v  . H/O vertigo   . History of kidney stones    passed on her own  . HYPERLIPIDEMIA 10/06/2007  . HYPERTENSION 10/06/2007  . Pneumonia   . Restless leg syndrome      Social History   Socioeconomic History  . Marital status: Married    Spouse name: Not on file  . Number of children: Not on file  . Years of education: Not on file  . Highest education level: Not on file  Social Needs  . Financial resource strain: Not on file  . Food insecurity - worry: Not on file  . Food insecurity - inability: Not on file  . Transportation needs - medical: Not on file  . Transportation needs - non-medical: Not on file  Occupational History  . Not on file  Tobacco Use  . Smoking status: Never Smoker  . Smokeless tobacco: Never Used  Substance and Sexual Activity  . Alcohol use: No  . Drug use: No  . Sexual  activity: Not on file  Other Topics Concern  . Not on file  Social History Narrative  . Not on file    Past Surgical History:  Procedure Laterality Date  . ABDOMINAL HYSTERECTOMY    . APPENDECTOMY    . CARDIAC CATHETERIZATION  1992   ? 90's  . CHOLECYSTECTOMY    . COLONOSCOPY    . KNEE ARTHROSCOPY Right   . SPINAL CORD STIMULATOR INSERTION N/A 07/13/2014   Procedure: LUMBAR SPINAL CORD STIMULATOR INSERTION;  Surgeon: Venita Lick, MD;  Location: [facility] OR;  Service: Orthopedics;  Laterality: N/A;  . TONSILLECTOMY    . TUBAL LIGATION      Family History  Problem Relation Age of Onset  . Alzheimer's disease Mother   . Cancer Father   . Alcoholism Father     Allergies  Allergen Reactions  . Tetracycline Rash and Other (See Comments)    Agitation    Current Outpatient Medications on File Prior to Visit  Medication Sig Dispense Refill  . albuterol (PROAIR HFA) 108 (90 BASE) MCG/ACT inhaler Inhale 2 puffs into the lungs every 6 (six) hours as needed for wheezing.     Marland Kitchen amLODipine (NORVASC) 5 MG tablet TAKE 1 TABLET BY MOUTH  DAILY (Patient taking differently: TAKE 5 MG BY MOUTH  DAILY) 90 tablet  1  . atorvastatin (LIPITOR) 40 MG tablet TAKE 1 TABLET BY MOUTH  DAILY (Patient taking differently: TAKE 40 MG BY MOUTH  DAILY) 90 tablet 1  . b complex vitamins tablet Take 1 tablet by mouth daily.    . clonazePAM (KLONOPIN) 1 MG tablet TAKE 1 TABLET AT BEDTIME AS NEEDED. NEED OFFICE VISIT BEFORE FURTHER REFILLS 90 tablet 0  . glucose blood (FREESTYLE LITE) test strip USE TO CHECK BLOOD SUGAR DAILY AND PRN 100 each 12  . HYDROcodone-acetaminophen (NORCO) 10-325 MG tablet Take 0.5 tablets by mouth 2 (two) times daily as needed for moderate pain. (Patient taking differently: Take 0.5 tablets by mouth every 6 (six) hours as needed for moderate pain. ) 30 tablet 0  . Lancets (FREESTYLE) lancets USE TO CHECK BLOOD SUGAR DAILY AND PRN 100 each 12  . LYSINE PO Take 1 tablet by mouth daily as  needed (for cold sores).     . metFORMIN (GLUCOPHAGE) 500 MG tablet Take 1 tablet (500 mg total) by mouth 2 (two) times daily with a meal. 180 tablet 2  . valACYclovir (VALTREX) 500 MG tablet Take 1 tablet (500 mg total) by mouth 2 (two) times daily. (Patient taking differently: Take 500 mg by mouth 2 (two) times daily as needed (for cold sores). ) 20 tablet 5   No current facility-administered medications on file prior to visit.     BP 140/78 (BP Location: Right Arm, Patient Position: Sitting, Cuff Size: Large)   Pulse 65   Temp 97.9 F (36.6 C) (Oral)   Ht 5' 4.5" (1.638 m)   Wt 174 lb (78.9 kg)   SpO2 99%   BMI 29.41 kg/m     Review of Systems  Constitutional: Negative.   HENT: Negative for congestion, dental problem, hearing loss, rhinorrhea, sinus pressure, sore throat and tinnitus.   Eyes: Negative for pain, discharge and visual disturbance.  Respiratory: Positive for wheezing. Negative for cough and shortness of breath.   Cardiovascular: Negative for chest pain, palpitations and leg swelling.  Gastrointestinal: Negative for abdominal distention, abdominal pain, blood in stool, constipation, diarrhea, nausea and vomiting.  Genitourinary: Negative for difficulty urinating, dysuria, flank pain, frequency, hematuria, pelvic pain, urgency, vaginal bleeding, vaginal discharge and vaginal pain.  Musculoskeletal: Positive for arthralgias and back pain. Negative for gait problem and joint swelling.  Skin: Negative for rash.  Neurological: Negative for dizziness, syncope, speech difficulty, weakness, numbness and headaches.  Hematological: Negative for adenopathy.  Psychiatric/Behavioral: Negative for agitation, behavioral problems and dysphoric mood. The patient is not nervous/anxious.        Objective:   Physical Exam  Constitutional: She is oriented to person, place, and time. She appears well-developed and well-nourished.  Blood pressure 136/80 Weight 174  HENT:  Head:  Normocephalic and atraumatic.  Right Ear: External ear normal.  Left Ear: External ear normal.  Mouth/Throat: Oropharynx is clear and moist.  Eyes: Conjunctivae and EOM are normal. Pupils are equal, round, and reactive to light.  Neck: Normal range of motion. Neck supple. No JVD present. No thyromegaly present.  Cardiovascular: Normal rate, regular rhythm, normal heart sounds and intact distal pulses.  No murmur heard. Pulmonary/Chest: Effort normal and breath sounds normal. She has no wheezes. She has no rales.  Abdominal: Soft. Bowel sounds are normal. She exhibits no distension and no mass. There is no tenderness. There is no rebound and no guarding.  Genitourinary: Vagina normal.  Musculoskeletal: Normal range of motion. She exhibits no edema or tenderness.  Lymphadenopathy:    She has no cervical adenopathy.  Neurological: She is alert and oriented to person, place, and time. She has normal reflexes. No cranial nerve deficit. She exhibits normal muscle tone. Coordination normal.  Skin: Skin is warm and dry. No rash noted.  Psychiatric: She has a normal mood and affect. Her behavior is normal.          Assessment & Plan:   Preventive health examination Diabetes mellitus.  Will check hemoglobin A1c.  Annual eye examination has been scheduled.  Diabetic foot examination normal will check urine for microalbumin and lipid profile Dyslipidemia check lipid profile Essential hypertension stable.  Home blood pressure monitoring encouraged modest weight loss encouraged.  Follow-up 6 months  Review screening lab  Agh Laveen LLCKWIATKOWSKI,PETER FRANK

## 2017-08-04 NOTE — Telephone Encounter (Signed)
Call to [facility] in [place]  [phone]. States Stephanie Gonzalez was last seen 2017 Verbalized she would schedule an eye exam  Will fax over notes

## 2017-08-04 NOTE — Patient Instructions (Addendum)
Limit your sodium (Salt) intake    It is important that you exercise regularly, at least 20 minutes 3 to 4 times per week.  If you develop chest pain or shortness of breath seek  medical attention.   Please check your hemoglobin A1c every 3-6  Months    Health Maintenance for Postmenopausal Women Menopause is a normal process in which your reproductive ability comes to an end. This process happens gradually over a span of months to years, usually between the ages of 59 and 13. Menopause is complete when you have missed 12 consecutive menstrual periods. It is important to talk with your health care provider about some of the most common conditions that affect postmenopausal women, such as heart disease, cancer, and bone loss (osteoporosis). Adopting a healthy lifestyle and getting preventive care can help to promote your health and wellness. Those actions can also lower your chances of developing some of these common conditions. What should I know about menopause? During menopause, you may experience a number of symptoms, such as:  Moderate-to-severe hot flashes.  Night sweats.  Decrease in sex drive.  Mood swings.  Headaches.  Tiredness.  Irritability.  Memory problems.  Insomnia.  Choosing to treat or not to treat menopausal changes is an individual decision that you make with your health care provider. What should I know about hormone replacement therapy and supplements? Hormone therapy products are effective for treating symptoms that are associated with menopause, such as hot flashes and night sweats. Hormone replacement carries certain risks, especially as you become older. If you are thinking about using estrogen or estrogen with progestin treatments, discuss the benefits and risks with your health care provider. What should I know about heart disease and stroke? Heart disease, heart attack, and stroke become more likely as you age. This may be due, in part, to the hormonal  changes that your body experiences during menopause. These can affect how your body processes dietary fats, triglycerides, and cholesterol. Heart attack and stroke are both medical emergencies. There are many things that you can do to help prevent heart disease and stroke:  Have your blood pressure checked at least every 1-2 years. High blood pressure causes heart disease and increases the risk of stroke.  If you are 89-9 years old, ask your health care provider if you should take aspirin to prevent a heart attack or a stroke.  Do not use any tobacco products, including cigarettes, chewing tobacco, or electronic cigarettes. If you need help quitting, ask your health care provider.  It is important to eat a healthy diet and maintain a healthy weight. ? Be sure to include plenty of vegetables, fruits, low-fat dairy products, and lean protein. ? Avoid eating foods that are high in solid fats, added sugars, or salt (sodium).  Get regular exercise. This is one of the most important things that you can do for your health. ? Try to exercise for at least 150 minutes each week. The type of exercise that you do should increase your heart rate and make you sweat. This is known as moderate-intensity exercise. ? Try to do strengthening exercises at least twice each week. Do these in addition to the moderate-intensity exercise.  Know your numbers.Ask your health care provider to check your cholesterol and your blood glucose. Continue to have your blood tested as directed by your health care provider.  What should I know about cancer screening? There are several types of cancer. Take the following steps to reduce your risk  and to catch any cancer development as early as possible. Breast Cancer  Practice breast self-awareness. ? This means understanding how your breasts normally appear and feel. ? It also means doing regular breast self-exams. Let your health care provider know about any changes, no  matter how small.  If you are 68 or older, have a clinician do a breast exam (clinical breast exam or CBE) every year. Depending on your age, family history, and medical history, it may be recommended that you also have a yearly breast X-ray (mammogram).  If you have a family history of breast cancer, talk with your health care provider about genetic screening.  If you are at high risk for breast cancer, talk with your health care provider about having an MRI and a mammogram every year.  Breast cancer (BRCA) gene test is recommended for women who have family members with BRCA-related cancers. Results of the assessment will determine the need for genetic counseling and BRCA1 and for BRCA2 testing. BRCA-related cancers include these types: ? Breast. This occurs in males or females. ? Ovarian. ? Tubal. This may also be called fallopian tube cancer. ? Cancer of the abdominal or pelvic lining (peritoneal cancer). ? Prostate. ? Pancreatic.  Cervical, Uterine, and Ovarian Cancer Your health care provider may recommend that you be screened regularly for cancer of the pelvic organs. These include your ovaries, uterus, and vagina. This screening involves a pelvic exam, which includes checking for microscopic changes to the surface of your cervix (Pap test).  For women ages 21-65, health care providers may recommend a pelvic exam and a Pap test every three years. For women ages 87-65, they may recommend the Pap test and pelvic exam, combined with testing for human papilloma virus (HPV), every five years. Some types of HPV increase your risk of cervical cancer. Testing for HPV may also be done on women of any age who have unclear Pap test results.  Other health care providers may not recommend any screening for nonpregnant women who are considered low risk for pelvic cancer and have no symptoms. Ask your health care provider if a screening pelvic exam is right for you.  If you have had past treatment for  cervical cancer or a condition that could lead to cancer, you need Pap tests and screening for cancer for at least 20 years after your treatment. If Pap tests have been discontinued for you, your risk factors (such as having a new sexual partner) need to be reassessed to determine if you should start having screenings again. Some women have medical problems that increase the chance of getting cervical cancer. In these cases, your health care provider may recommend that you have screening and Pap tests more often.  If you have a family history of uterine cancer or ovarian cancer, talk with your health care provider about genetic screening.  If you have vaginal bleeding after reaching menopause, tell your health care provider.  There are currently no reliable tests available to screen for ovarian cancer.  Lung Cancer Lung cancer screening is recommended for adults 36-30 years old who are at high risk for lung cancer because of a history of smoking. A yearly low-dose CT scan of the lungs is recommended if you:  Currently smoke.  Have a history of at least 30 pack-years of smoking and you currently smoke or have quit within the past 15 years. A pack-year is smoking an average of one pack of cigarettes per day for one year.  Yearly screening  should:  Continue until it has been 15 years since you quit.  Stop if you develop a health problem that would prevent you from having lung cancer treatment.  Colorectal Cancer  This type of cancer can be detected and can often be prevented.  Routine colorectal cancer screening usually begins at age 58 and continues through age 90.  If you have risk factors for colon cancer, your health care provider may recommend that you be screened at an earlier age.  If you have a family history of colorectal cancer, talk with your health care provider about genetic screening.  Your health care provider may also recommend using home test kits to check for hidden  blood in your stool.  A small camera at the end of a tube can be used to examine your colon directly (sigmoidoscopy or colonoscopy). This is done to check for the earliest forms of colorectal cancer.  Direct examination of the colon should be repeated every 5-10 years until age 23. However, if early forms of precancerous polyps or small growths are found or if you have a family history or genetic risk for colorectal cancer, you may need to be screened more often.  Skin Cancer  Check your skin from head to toe regularly.  Monitor any moles. Be sure to tell your health care provider: ? About any new moles or changes in moles, especially if there is a change in a mole's shape or color. ? If you have a mole that is larger than the size of a pencil eraser.  If any of your family members has a history of skin cancer, especially at a young age, talk with your health care provider about genetic screening.  Always use sunscreen. Apply sunscreen liberally and repeatedly throughout the day.  Whenever you are outside, protect yourself by wearing long sleeves, pants, a wide-brimmed hat, and sunglasses.  What should I know about osteoporosis? Osteoporosis is a condition in which bone destruction happens more quickly than new bone creation. After menopause, you may be at an increased risk for osteoporosis. To help prevent osteoporosis or the bone fractures that can happen because of osteoporosis, the following is recommended:  If you are 61-31 years old, get at least 1,000 mg of calcium and at least 600 mg of vitamin D per day.  If you are older than age 101 but younger than age 3, get at least 1,200 mg of calcium and at least 600 mg of vitamin D per day.  If you are older than age 75, get at least 1,200 mg of calcium and at least 800 mg of vitamin D per day.  Smoking and excessive alcohol intake increase the risk of osteoporosis. Eat foods that are rich in calcium and vitamin D, and do weight-bearing  exercises several times each week as directed by your health care provider. What should I know about how menopause affects my mental health? Depression may occur at any age, but it is more common as you become older. Common symptoms of depression include:  Low or sad mood.  Changes in sleep patterns.  Changes in appetite or eating patterns.  Feeling an overall lack of motivation or enjoyment of activities that you previously enjoyed.  Frequent crying spells.  Talk with your health care provider if you think that you are experiencing depression. What should I know about immunizations? It is important that you get and maintain your immunizations. These include:  Tetanus, diphtheria, and pertussis (Tdap) booster vaccine.  Influenza every year  before the flu season begins.  Pneumonia vaccine.  Shingles vaccine. Please see your eye doctor yearly to check for diabetic eye damage  Your health care provider may also recommend other immunizations. This information is not intended to replace advice given to you by your health care provider. Make sure you discuss any questions you have with your health care provider. Document Released: 07/18/2005 Document Revised: 12/14/2015 Document Reviewed: 02/27/2015 Elsevier Interactive Patient Education  2018 [employer].

## 2017-10-02 ENCOUNTER — Other Ambulatory Visit: Payer: Self-pay | Admitting: Internal Medicine

## 2017-10-06 ENCOUNTER — Telehealth: Payer: Self-pay | Admitting: Internal Medicine

## 2017-10-06 NOTE — Telephone Encounter (Unsigned)
Copied from CRM [id number]. Topic: Quick Communication - Rx Refill/Question >> Oct 06, 2017  5:52 PM Raquel Sarna wrote: clonazePAM Scarlette Calico) 1 MG tablet  Pt needing is a refill asap  - pt has been trying since Fri.  [facility] [phone] - [place], [place] - [address] [place] [address] Phone: [phone] Fax: [phone]

## 2017-10-06 NOTE — Telephone Encounter (Signed)
Patient called to check the status of her refill. She said that she spoke with someone in the office and told her and it was going to be done yesterday but meds have not been sent yet.

## 2017-10-07 ENCOUNTER — Other Ambulatory Visit: Payer: Self-pay | Admitting: Internal Medicine

## 2017-10-07 NOTE — Telephone Encounter (Signed)
Medication was phone in due to pharmacy claiming they never received medication. Patient spouse notified.

## 2017-10-07 NOTE — Telephone Encounter (Signed)
Patient called back stating that pharmacy has not received med. Please re-fax.

## 2017-10-07 NOTE — Telephone Encounter (Signed)
Rx has been printed and faxed by office

## 2017-10-08 NOTE — Telephone Encounter (Signed)
Patient has picked up medication 

## 2017-10-28 LAB — HM DIABETES EYE EXAM

## 2017-11-26 ENCOUNTER — Encounter: Payer: Self-pay | Admitting: Internal Medicine

## 2017-12-22 ENCOUNTER — Other Ambulatory Visit: Payer: Self-pay | Admitting: Internal Medicine

## 2018-01-28 ENCOUNTER — Ambulatory Visit: Payer: Medicare Other | Admitting: Physician Assistant

## 2018-01-28 ENCOUNTER — Encounter: Payer: Self-pay | Admitting: Physician Assistant

## 2018-01-28 VITALS — BP 130/64 | HR 56 | Temp 97.8°F | Ht 64.5 in | Wt 169.0 lb

## 2018-01-28 DIAGNOSIS — G2581 Restless legs syndrome: Secondary | ICD-10-CM | POA: Insufficient documentation

## 2018-01-28 DIAGNOSIS — E119 Type 2 diabetes mellitus without complications: Secondary | ICD-10-CM | POA: Diagnosis not present

## 2018-01-28 DIAGNOSIS — J4520 Mild intermittent asthma, uncomplicated: Secondary | ICD-10-CM

## 2018-01-28 DIAGNOSIS — I10 Essential (primary) hypertension: Secondary | ICD-10-CM | POA: Diagnosis not present

## 2018-01-28 MED ORDER — CLONAZEPAM 1 MG PO TABS: 1.0000 mg | ORAL_TABLET | Freq: Every evening | ORAL | 1 refills | Status: DC | PRN

## 2018-02-02 NOTE — Progress Notes (Signed)
BP 130/64   Pulse (!) 56   Temp 97.8 F (36.6 C)   Ht 5' 4.5" (1.638 m)   Wt 169 lb (76.7 kg)   BMI 28.56 kg/m    Subjective:    Patient ID: Stephanie RamusSue M Tien, female    DOB: [birth year], 77 y.o.   MRN: [id number]  HPI: Stephanie RamusSue M Roemer is a 77 y.o. female presenting on 01/28/2018 for No chief complaint on file.  This patient comes in as a new patient to be established.  Her provider has retired.  She denies any problems at this time.  She does have hypertension, anxiety hyperlipidemia, intermittent asthma, diabetes well controlled.  We have reviewed all of her medications.  She is only in need of her Klonopin to be refilled.  She actually takes this for her restless leg syndrome.  She was started several years ago and it has worked very well. She reports no other new problems at this time.  Past Medical History:  Diagnosis Date  . Allergy   . Arthritis   . ASTHMA 10/06/2007  . Bronchial asthma   . BURSITIS, RIGHT HIP 02/04/2010  . DIABETES MELLITUS, TYPE II 11/01/2009  . Family history of adverse reaction to anesthesia    Mother had n/v  . H/O vertigo   . History of kidney stones    passed on her own  . HYPERLIPIDEMIA 10/06/2007  . HYPERTENSION 10/06/2007  . Pneumonia   . Restless leg syndrome    Relevant past medical, surgical, family and social history reviewed and updated as indicated. Interim medical history since our last visit reviewed. Allergies and medications reviewed and updated. DATA REVIEWED: CHART IN EPIC  Family History reviewed for pertinent findings.  Review of Systems  Constitutional: Negative.  Negative for activity change, fatigue and fever.  HENT: Negative.   Eyes: Negative.   Respiratory: Negative.  Negative for cough.   Cardiovascular: Negative.  Negative for chest pain.  Gastrointestinal: Negative.  Negative for abdominal pain.  Endocrine: Negative.   Genitourinary: Negative.  Negative for dysuria.  Musculoskeletal: Negative.   Skin: Negative.     Neurological: Negative.     Allergies as of 01/28/2018      Reactions   Other    Oranges; juice or nuts    Tetracycline Rash, Other (See Comments)   Agitation      Medication List        Accurate as of 01/28/18 11:59 PM. Always use your most recent med list.          amLODipine 5 MG tablet Commonly known as:  NORVASC TAKE 1 TABLET BY MOUTH  DAILY   atorvastatin 40 MG tablet Commonly known as:  LIPITOR TAKE 1 TABLET BY MOUTH  DAILY   b complex vitamins tablet Take 1 tablet by mouth daily.   clonazePAM 1 MG tablet Commonly known as:  KLONOPIN Take 1 tablet (1 mg total) by mouth at bedtime as needed.   freestyle lancets USE TO CHECK BLOOD SUGAR DAILY AND PRN   glucose blood test strip USE TO CHECK BLOOD SUGAR DAILY AND PRN   HYDROcodone-acetaminophen 10-325 MG tablet Commonly known as:  NORCO Take 0.5 tablets by mouth 2 (two) times daily as needed for moderate pain.   LYSINE PO Take 1 tablet by mouth daily as needed (for cold sores).   metFORMIN 500 MG tablet Commonly known as:  GLUCOPHAGE Take 1 tablet (500 mg total) by mouth 2 (two) times daily with a meal.  PROAIR HFA 108 (90 Base) MCG/ACT inhaler Generic drug:  albuterol Inhale 2 puffs into the lungs every 6 (six) hours as needed for wheezing.   valACYclovir 500 MG tablet Commonly known as:  VALTREX Take 1 tablet (500 mg total) by mouth 2 (two) times daily.          Objective:    BP 130/64   Pulse (!) 56   Temp 97.8 F (36.6 C)   Ht 5' 4.5" (1.638 m)   Wt 169 lb (76.7 kg)   BMI 28.56 kg/m   Allergies  Allergen Reactions  . Other     Oranges; juice or nuts   . Tetracycline Rash and Other (See Comments)    Agitation    Wt Readings from Last 3 Encounters:  01/28/18 169 lb (76.7 kg)  08/04/17 174 lb (78.9 kg)  08/04/17 174 lb (78.9 kg)    Physical Exam  Constitutional: She is oriented to person, place, and time. She appears well-developed and well-nourished.  HENT:  Head:  Normocephalic and atraumatic.  Right Ear: Tympanic membrane, external ear and ear canal normal.  Left Ear: Tympanic membrane, external ear and ear canal normal.  Nose: Nose normal. No rhinorrhea.  Mouth/Throat: Oropharynx is clear and moist and mucous membranes are normal. No oropharyngeal exudate or posterior oropharyngeal erythema.  Eyes: Pupils are equal, round, and reactive to light. Conjunctivae and EOM are normal.  Neck: Normal range of motion. Neck supple.  Cardiovascular: Normal rate, regular rhythm, normal heart sounds and intact distal pulses.  Pulmonary/Chest: Effort normal and breath sounds normal.  Abdominal: Soft. Bowel sounds are normal.  Neurological: She is alert and oriented to person, place, and time. She has normal reflexes.  Skin: Skin is warm and dry. No rash noted.  Psychiatric: She has a normal mood and affect. Her behavior is normal. Judgment and thought content normal.    Results for orders placed or performed in visit on 11/26/17  HM DIABETES EYE EXAM  Result Value Ref Range   HM Diabetic Eye Exam No Retinopathy No Retinopathy      Assessment & Plan:   1. Restless legs syndrome (RLS) - clonazePAM (KLONOPIN) 1 MG tablet; Take 1 tablet (1 mg total) by mouth at bedtime as needed.  Dispense: 90 tablet; Refill: 1  2. Essential hypertension Amlodipine continue  3. Mild intermittent asthma without complication Albuterol prn  4. Diabetes mellitus without complication (HCC) Metformin continue   Continue all other maintenance medications as listed above.  Follow up plan: Return in about 6 months (around 07/31/2018) for wellexam and labs.  Educational handout given for survey  Remus Loffler PA-C [facility] [address]  [place], [place] [address] [phone]   02/02/2018, 8:25 AM

## 2018-03-22 ENCOUNTER — Ambulatory Visit: Payer: Medicare Other

## 2018-03-23 ENCOUNTER — Ambulatory Visit (INDEPENDENT_AMBULATORY_CARE_PROVIDER_SITE_OTHER): Payer: Medicare Other

## 2018-03-23 DIAGNOSIS — Z23 Encounter for immunization: Secondary | ICD-10-CM | POA: Diagnosis not present

## 2018-05-04 ENCOUNTER — Other Ambulatory Visit: Payer: Self-pay | Admitting: *Deleted

## 2018-05-04 ENCOUNTER — Other Ambulatory Visit: Payer: Self-pay | Admitting: Internal Medicine

## 2018-05-04 MED ORDER — ATORVASTATIN CALCIUM 40 MG PO TABS: 40.0000 mg | ORAL_TABLET | Freq: Every day | ORAL | 1 refills | Status: DC

## 2018-05-05 ENCOUNTER — Other Ambulatory Visit: Payer: Self-pay | Admitting: *Deleted

## 2018-05-05 MED ORDER — AMLODIPINE BESYLATE 5 MG PO TABS: 5.0000 mg | ORAL_TABLET | Freq: Every day | ORAL | 1 refills | Status: DC

## 2018-06-07 DIAGNOSIS — M5136 Other intervertebral disc degeneration, lumbar region: Secondary | ICD-10-CM | POA: Insufficient documentation

## 2018-06-07 DIAGNOSIS — M48061 Spinal stenosis, lumbar region without neurogenic claudication: Secondary | ICD-10-CM | POA: Insufficient documentation

## 2018-06-22 ENCOUNTER — Other Ambulatory Visit: Payer: Self-pay | Admitting: Family Medicine

## 2018-06-22 DIAGNOSIS — Z1231 Encounter for screening mammogram for malignant neoplasm of breast: Secondary | ICD-10-CM

## 2018-07-29 ENCOUNTER — Ambulatory Visit
Admission: RE | Admit: 2018-07-29 | Discharge: 2018-07-29 | Disposition: A | Discharge status: Home / Self Care | Payer: Medicare Other | Source: Ambulatory Visit | Attending: Family Medicine | Admitting: Family Medicine

## 2018-07-29 DIAGNOSIS — Z1231 Encounter for screening mammogram for malignant neoplasm of breast: Secondary | ICD-10-CM

## 2018-08-02 ENCOUNTER — Other Ambulatory Visit: Payer: Medicare Other

## 2018-08-05 ENCOUNTER — Ambulatory Visit: Payer: Medicare Other

## 2018-08-09 ENCOUNTER — Ambulatory Visit (INDEPENDENT_AMBULATORY_CARE_PROVIDER_SITE_OTHER): Payer: Medicare Other | Admitting: Physician Assistant

## 2018-08-09 ENCOUNTER — Encounter: Payer: Self-pay | Admitting: Physician Assistant

## 2018-08-09 ENCOUNTER — Encounter: Payer: Medicare Other | Admitting: Physician Assistant

## 2018-08-09 VITALS — BP 127/76 | HR 77 | Temp 97.2°F | Ht 64.5 in | Wt 173.0 lb

## 2018-08-09 DIAGNOSIS — E785 Hyperlipidemia, unspecified: Secondary | ICD-10-CM

## 2018-08-09 DIAGNOSIS — M48061 Spinal stenosis, lumbar region without neurogenic claudication: Secondary | ICD-10-CM

## 2018-08-09 DIAGNOSIS — Z0001 Encounter for general adult medical examination with abnormal findings: Secondary | ICD-10-CM | POA: Diagnosis not present

## 2018-08-09 DIAGNOSIS — F339 Major depressive disorder, recurrent, unspecified: Secondary | ICD-10-CM

## 2018-08-09 DIAGNOSIS — G2581 Restless legs syndrome: Secondary | ICD-10-CM | POA: Diagnosis not present

## 2018-08-09 DIAGNOSIS — G8929 Other chronic pain: Secondary | ICD-10-CM

## 2018-08-09 DIAGNOSIS — I10 Essential (primary) hypertension: Secondary | ICD-10-CM

## 2018-08-09 DIAGNOSIS — M5136 Other intervertebral disc degeneration, lumbar region: Secondary | ICD-10-CM | POA: Diagnosis not present

## 2018-08-09 DIAGNOSIS — Z0000 Encounter for general adult medical examination without abnormal findings: Secondary | ICD-10-CM

## 2018-08-09 DIAGNOSIS — E119 Type 2 diabetes mellitus without complications: Secondary | ICD-10-CM

## 2018-08-09 DIAGNOSIS — M51369 Other intervertebral disc degeneration, lumbar region without mention of lumbar back pain or lower extremity pain: Secondary | ICD-10-CM

## 2018-08-09 LAB — BAYER DCA HB A1C WAIVED: HB A1C: 5 % (ref <7.0)

## 2018-08-09 MED ORDER — METFORMIN HCL 500 MG PO TABS: 500.0000 mg | ORAL_TABLET | Freq: Two times a day (BID) | ORAL | 2 refills | Status: DC

## 2018-08-09 MED ORDER — CLONAZEPAM 1 MG PO TABS: 1.0000 mg | ORAL_TABLET | Freq: Every evening | ORAL | 1 refills | Status: DC | PRN

## 2018-08-09 MED ORDER — AMLODIPINE BESYLATE 5 MG PO TABS
5.0000 mg | ORAL_TABLET | Freq: Every day | ORAL | 1 refills | Status: DC
Start: 2018-08-09 — End: 2019-04-18

## 2018-08-09 MED ORDER — SERTRALINE HCL 50 MG PO TABS: 25.0000 mg | ORAL_TABLET | Freq: Every day | ORAL | 5 refills | Status: DC

## 2018-08-09 MED ORDER — ATORVASTATIN CALCIUM 40 MG PO TABS: 40.0000 mg | ORAL_TABLET | Freq: Every day | ORAL | 1 refills | Status: DC

## 2018-08-09 NOTE — Progress Notes (Signed)
 BP 127/76   Pulse 77   Temp (!) 97.2 F (36.2 C) (Oral)   Ht 5' 4.5" (1.638 m)   Wt 173 lb (78.5 kg)   BMI 29.24 kg/m    Subjective:    Patient ID: Stephanie Gonzalez, female    DOB: 07/21/1940, 77 y.o.   MRN: [id number]  HPI: Stephanie Gonzalez is a 77 y.o. female presenting on 08/09/2018 for Annual Exam This patient comes in for annual well physical examination. All medications are reviewed today. There are no reports of any problems with the medications. All of the medical conditions are reviewed and updated.  Lab work is reviewed and will be ordered as medically necessary. There are no new problems reported with today's visit.  Patient reports doing well overall.  restless leg syndrome: Has tied multiple neurologic medications without relief. Has had excellent control on clonazepam 1 mg daily for the leg movement and pain.  She was started several years ago and it has worked very well.  DDD and spinal stenosis: Continue with every 6 month visits with Dr. Ramos, and rare pain treatment.  Past Medical History:  Diagnosis Date  . Allergy   . Arthritis   . ASTHMA 10/06/2007  . Bronchial asthma   . BURSITIS, RIGHT HIP 02/04/2010  . DIABETES MELLITUS, TYPE II 11/01/2009  . Family history of adverse reaction to anesthesia    Mother had n/v  . H/O vertigo   . History of kidney stones    passed on her own  . HYPERLIPIDEMIA 10/06/2007  . HYPERTENSION 10/06/2007  . Pneumonia   . Restless leg syndrome    Relevant past medical, surgical, family and social history reviewed and updated as indicated. Interim medical history since our last visit reviewed. Allergies and medications reviewed and updated. DATA REVIEWED: CHART IN EPIC  Family History reviewed for pertinent findings.  Review of Systems  Constitutional: Negative.  Negative for activity change, fatigue and fever.  HENT: Negative.   Eyes: Negative.   Respiratory: Negative.  Negative for cough.   Cardiovascular: Negative.  Negative  for chest pain.  Gastrointestinal: Negative.  Negative for abdominal pain.  Endocrine: Negative.   Genitourinary: Negative.  Negative for dysuria.  Musculoskeletal: Positive for arthralgias, back pain and myalgias.  Skin: Negative.   Neurological: Positive for tremors.  Psychiatric/Behavioral: Negative for suicidal ideas. The patient is nervous/anxious.    Depression screen PHQ 2/9 08/09/2018 01/28/2018 08/04/2017 07/01/2016 06/13/2014  Decreased Interest 0 0 0 0 0  Down, Depressed, Hopeless 0 0 0 0 1  PHQ - 2 Score 0 0 0 0 1     Allergies as of 08/09/2018      Reactions   Other    Oranges; juice or nuts    Tetracycline Rash, Other (See Comments)   Agitation      Medication List       Accurate as of August 09, 2018  9:08 PM. Always use your most recent med list.        amLODipine 5 MG tablet Commonly known as:  NORVASC Take 1 tablet (5 mg total) by mouth daily.   atorvastatin 40 MG tablet Commonly known as:  LIPITOR Take 1 tablet (40 mg total) by mouth daily.   b complex vitamins tablet Take 1 tablet by mouth daily.   clonazePAM 1 MG tablet Commonly known as:  KLONOPIN Take 1 tablet (1 mg total) by mouth at bedtime as needed.   freestyle lancets USE TO CHECK BLOOD SUGAR   DAILY AND PRN   glucose blood test strip Commonly known as:  FREESTYLE LITE USE TO CHECK BLOOD SUGAR DAILY AND PRN   HYDROcodone-acetaminophen 10-325 MG tablet Commonly known as:  NORCO Take 0.5 tablets by mouth 2 (two) times daily as needed for moderate pain.   LYSINE PO Take 1 tablet by mouth daily as needed (for cold sores).   metFORMIN 500 MG tablet Commonly known as:  GLUCOPHAGE Take 1 tablet (500 mg total) by mouth 2 (two) times daily with a meal.   PROAIR HFA 108 (90 Base) MCG/ACT inhaler Generic drug:  albuterol Inhale 2 puffs into the lungs every 6 (six) hours as needed for wheezing.   sertraline 50 MG tablet Commonly known as:  ZOLOFT Take 0.5-1 tablets (25-50 mg total) by mouth  daily.   valACYclovir 500 MG tablet Commonly known as:  VALTREX Take 1 tablet (500 mg total) by mouth 2 (two) times daily.          Objective:    BP 127/76   Pulse 77   Temp (!) 97.2 F (36.2 C) (Oral)   Ht 5' 4.5" (1.638 m)   Wt 173 lb (78.5 kg)   BMI 29.24 kg/m   Allergies  Allergen Reactions  . Other     Oranges; juice or nuts   . Tetracycline Rash and Other (See Comments)    Agitation    Wt Readings from Last 3 Encounters:  08/09/18 173 lb (78.5 kg)  01/28/18 169 lb (76.7 kg)  08/04/17 174 lb (78.9 kg)    Physical Exam Constitutional:      Appearance: She is well-developed.  HENT:     Head: Normocephalic and atraumatic.     Right Ear: Tympanic membrane, ear canal and external ear normal.     Left Ear: Tympanic membrane, ear canal and external ear normal.     Nose: Nose normal. No rhinorrhea.     Mouth/Throat:     Pharynx: No oropharyngeal exudate or posterior oropharyngeal erythema.  Eyes:     Conjunctiva/sclera: Conjunctivae normal.     Pupils: Pupils are equal, round, and reactive to light.  Neck:     Musculoskeletal: Normal range of motion and neck supple.  Cardiovascular:     Rate and Rhythm: Normal rate and regular rhythm.     Heart sounds: Normal heart sounds.  Pulmonary:     Effort: Pulmonary effort is normal.     Breath sounds: Normal breath sounds.  Abdominal:     General: Bowel sounds are normal.     Palpations: Abdomen is soft.  Skin:    General: Skin is warm and dry.     Findings: No rash.  Neurological:     Mental Status: She is alert and oriented to person, place, and time.     Deep Tendon Reflexes: Reflexes are normal and symmetric.  Psychiatric:        Behavior: Behavior normal.        Thought Content: Thought content normal.        Judgment: Judgment normal.     Results for orders placed or performed in visit on 08/09/18  Bayer DCA Hb A1c Waived  Result Value Ref Range   HB A1C (BAYER DCA - WAIVED) 5.0 <7.0 %        Assessment & Plan:   1. Encounter for initial preventive physical examination covered by Medicare - CBC with Differential/Platelet - CMP14+EGFR - Lipid panel - Bayer DCA Hb A1c Waived  2. Restless legs  syndrome (RLS) - clonazePAM (KLONOPIN) 1 MG tablet; Take 1 tablet (1 mg total) by mouth at bedtime as needed.  Dispense: 90 tablet; Refill: 1 - ToxASSURE Select 13 (MW), Urine  3. Spinal stenosis of lumbar region, unspecified whether neurogenic claudication present Continue with every 6 month visits with Dr. Ramos  4. Degeneration of lumbar intervertebral disc Continue with every 6 month visits with Dr. Ramos   5. Essential hypertension - amLODipine (NORVASC) 5 MG tablet; Take 1 tablet (5 mg total) by mouth daily.  Dispense: 90 tablet; Refill: 1 - CBC with Differential/Platelet - CMP14+EGFR  6. Other chronic pain - ToxASSURE Select 13 (MW), Urine  7. Diabetes mellitus without complication (HCC) - metFORMIN (GLUCOPHAGE) 500 MG tablet; Take 1 tablet (500 mg total) by mouth 2 (two) times daily with a meal.  Dispense: 180 tablet; Refill: 2 - Lipid panel - Bayer DCA Hb A1c Waived  8. Dyslipidemia - atorvastatin (LIPITOR) 40 MG tablet; Take 1 tablet (40 mg total) by mouth daily.  Dispense: 90 tablet; Refill: 1 - Lipid panel  9. Depression, recurrent (HCC) - sertraline (ZOLOFT) 50 MG tablet; Take 0.5-1 tablets (25-50 mg total) by mouth daily.  Dispense: 30 tablet; Refill: 5   Continue all other maintenance medications as listed above.  Follow up plan: Return in about 6 months (around 02/09/2019) for recheck.  Educational handout given for survey  Angel S. Jones PA-C [facility] [address]  [place], [place] [address] [phone]   08/09/2018, 9:08 PM  

## 2018-08-10 LAB — CBC WITH DIFFERENTIAL/PLATELET
BASOS: 1 %
Basophils Absolute: 0.1 10*3/uL (ref 0.0–0.2)
EOS (ABSOLUTE): 0.2 10*3/uL (ref 0.0–0.4)
EOS: 2 %
HEMATOCRIT: 38.4 % (ref 34.0–46.6)
Hemoglobin: 12.8 g/dL (ref 11.1–15.9)
IMMATURE GRANULOCYTES: 0 %
Immature Grans (Abs): 0 10*3/uL (ref 0.0–0.1)
LYMPHS ABS: 4.2 10*3/uL — AB (ref 0.7–3.1)
Lymphs: 53 %
MCH: 28.6 pg (ref 26.6–33.0)
MCHC: 33.3 g/dL (ref 31.5–35.7)
MCV: 86 fL (ref 79–97)
MONOCYTES: 6 %
MONOS ABS: 0.5 10*3/uL (ref 0.1–0.9)
Neutrophils Absolute: 3 10*3/uL (ref 1.4–7.0)
Neutrophils: 38 %
Platelets: 212 10*3/uL (ref 150–450)
RBC: 4.47 x10E6/uL (ref 3.77–5.28)
RDW: 14 % (ref 11.7–15.4)
WBC: 7.8 10*3/uL (ref 3.4–10.8)

## 2018-08-10 LAB — LIPID PANEL
CHOL/HDL RATIO: 4.1 ratio (ref 0.0–4.4)
Cholesterol, Total: 169 mg/dL (ref 100–199)
HDL: 41 mg/dL (ref >39)
LDL Calculated: 62 mg/dL (ref 0–99)
TRIGLYCERIDES: 331 mg/dL — AB (ref 0–149)
VLDL CHOLESTEROL CAL: 66 mg/dL — AB (ref 5–40)

## 2018-08-10 LAB — CMP14+EGFR
ALBUMIN: 4.1 g/dL (ref 3.7–4.7)
ALT: 10 IU/L (ref 0–32)
AST: 15 IU/L (ref 0–40)
Albumin/Globulin Ratio: 1.7 (ref 1.2–2.2)
Alkaline Phosphatase: 119 IU/L — ABNORMAL HIGH (ref 39–117)
BUN/Creatinine Ratio: 19 (ref 12–28)
BUN: 11 mg/dL (ref 8–27)
Bilirubin Total: 0.2 mg/dL (ref 0.0–1.2)
CALCIUM: 9.1 mg/dL (ref 8.7–10.3)
CO2: 27 mmol/L (ref 20–29)
CREATININE: 0.59 mg/dL (ref 0.57–1.00)
Chloride: 102 mmol/L (ref 96–106)
GFR calc Af Amer: 103 mL/min/{1.73_m2} (ref >59)
GFR, EST NON AFRICAN AMERICAN: 89 mL/min/{1.73_m2} (ref >59)
Globulin, Total: 2.4 g/dL (ref 1.5–4.5)
Glucose: 86 mg/dL (ref 65–99)
POTASSIUM: 3.6 mmol/L (ref 3.5–5.2)
SODIUM: 140 mmol/L (ref 134–144)
Total Protein: 6.5 g/dL (ref 6.0–8.5)

## 2018-08-12 ENCOUNTER — Encounter: Payer: Self-pay | Admitting: *Deleted

## 2018-08-12 LAB — TOXASSURE SELECT 13 (MW), URINE

## 2018-08-17 ENCOUNTER — Encounter: Payer: Self-pay | Admitting: Physician Assistant

## 2018-09-20 ENCOUNTER — Other Ambulatory Visit: Payer: Self-pay

## 2018-09-20 ENCOUNTER — Ambulatory Visit (INDEPENDENT_AMBULATORY_CARE_PROVIDER_SITE_OTHER): Payer: Medicare Other | Admitting: Nurse Practitioner

## 2018-09-20 ENCOUNTER — Encounter: Payer: Self-pay | Admitting: Nurse Practitioner

## 2018-09-20 DIAGNOSIS — K1370 Unspecified lesions of oral mucosa: Secondary | ICD-10-CM

## 2018-09-20 MED ORDER — VALACYCLOVIR HCL 500 MG PO TABS: 500.0000 mg | ORAL_TABLET | Freq: Two times a day (BID) | ORAL | 5 refills | Status: DC

## 2018-09-20 MED ORDER — MAGIC MOUTHWASH: 5.0000 mL | Freq: Four times a day (QID) | ORAL | 0 refills | Status: DC

## 2018-09-20 NOTE — Progress Notes (Signed)
Patient ID: Stephanie Gonzalez, female   DOB: 08/13/1940, 78 y.o.   MRN: [id number]    Virtual Visit via telephone Note  I connected with Stephanie Gonzalez on 09/20/18 at 3:00 PM by telephone and verified that I am speaking with the correct person using two identifiers. Stephanie Gonzalez is currently located at home and non one is currently with her during visit. The provider, Mary-Margaret Daphine Deutscher, FNP is located in their office at time of visit.  I discussed the limitations, risks, security and privacy concerns of performing an evaluation and management service by telephone and the availability of in person appointments. I also discussed with the patient that there may be a patient responsible charge related to this service. The patient expressed understanding and agreed to proceed.   History and Present Illness:   Chief Complaint: Mouth Lesions   HPI Patient calls in today c/o blisters in her mouth mainly on her tongue.  SHe has been mixing benadryl and kaopectate and that use to help but not helping now. She says her tongue is so sore that she cant even eat. She has had this in the past and valtrex is the only thing that got rid of it.     Review of Systems  Constitutional: Negative for chills and fever.  HENT:       Blisters on tongue  Respiratory: Negative.   Cardiovascular: Negative.   Genitourinary: Negative.   Skin: Negative.   Neurological: Negative.   Psychiatric/Behavioral: Negative.   All other systems reviewed and are negative.    Observations/Objective: Alert and oriented No distress noted Describes mouth as having blisters on tongue  Assessment and Plan [name] in today with chief complaint of Mouth Lesions   1. Unspecified lesions of oral mucosa Follow Up Instructions: Meds ordered this encounter  Medications  . magic mouthwash SOLN    Sig: Take 5 mLs by mouth 4 (four) times daily.    Dispense:  100 mL    Refill:  0    Order Specific Question:    Supervising Provider    Answer:   Arville Care A [id number]  . valACYclovir (VALTREX) 500 MG tablet    Sig: Take 1 tablet (500 mg total) by mouth 2 (two) times daily.    Dispense:  20 tablet    Refill:  5    Order Specific Question:   Supervising Provider    Answer:   Arville Care A [[id number]]   Drink cold liquids Let me know if does not improve    I discussed the assessment and treatment plan with the patient. The patient was provided an opportunity to ask questions and all were answered. The patient agreed with the plan and demonstrated an understanding of the instructions.   The patient was advised to call back or seek an in-person evaluation if the symptoms worsen or if the condition fails to improve as anticipated.  The above assessment and management plan was discussed with the patient. The patient verbalized understanding of and has agreed to the management plan. Patient is aware to call the clinic if symptoms persist or worsen. Patient is aware when to return to the clinic for a follow-up visit. Patient educated on when it is appropriate to go to the emergency department.    I provided 8 minutes of non-face-to-face time during this encounter.    Mary-Margaret Daphine Deutscher, FNP

## 2018-09-23 ENCOUNTER — Telehealth: Payer: Self-pay | Admitting: Physician Assistant

## 2018-09-23 NOTE — Telephone Encounter (Signed)
Pt will contact pharmacy refill sent in on 08/09/18

## 2018-09-23 NOTE — Telephone Encounter (Signed)
Pt is needing a refill on clonazePAM (KLONOPIN) 1 MG tablet said she will be due here in few days and wants to go ahead and request it   [facility]

## 2018-09-24 ENCOUNTER — Ambulatory Visit (INDEPENDENT_AMBULATORY_CARE_PROVIDER_SITE_OTHER): Payer: Medicare Other | Admitting: Physician Assistant

## 2018-09-24 ENCOUNTER — Other Ambulatory Visit: Payer: Self-pay

## 2018-09-24 DIAGNOSIS — G2581 Restless legs syndrome: Secondary | ICD-10-CM

## 2018-09-24 NOTE — Progress Notes (Signed)
    Telephone visit Called patient on 2 different phones around 9:06 to 9:08 AM.  The patient states she was asleep and get a call back at 1 PM.  We will attempt to do a phone visit at that time. No answer at 1:09 PM, 1:11 PM, 1:13 PM Really did not need an appointment today.  So this can be put in as a no charge.   Prudy Feeler PA-C [facility] [phone]

## 2018-11-09 ENCOUNTER — Telehealth: Payer: Self-pay | Admitting: Physician Assistant

## 2019-02-09 ENCOUNTER — Ambulatory Visit: Payer: Medicare Other | Admitting: Physician Assistant

## 2019-03-07 ENCOUNTER — Encounter: Payer: Self-pay | Admitting: Physician Assistant

## 2019-03-07 ENCOUNTER — Ambulatory Visit (INDEPENDENT_AMBULATORY_CARE_PROVIDER_SITE_OTHER): Payer: Medicare Other | Admitting: Physician Assistant

## 2019-03-07 DIAGNOSIS — L299 Pruritus, unspecified: Secondary | ICD-10-CM

## 2019-03-07 DIAGNOSIS — L509 Urticaria, unspecified: Secondary | ICD-10-CM

## 2019-03-07 DIAGNOSIS — R21 Rash and other nonspecific skin eruption: Secondary | ICD-10-CM | POA: Diagnosis not present

## 2019-03-07 MED ORDER — FAMOTIDINE 20 MG PO TABS: 20.0000 mg | ORAL_TABLET | Freq: Two times a day (BID) | ORAL | 5 refills | Status: DC

## 2019-03-07 MED ORDER — PREDNISONE 10 MG (48) PO TBPK: ORAL_TABLET | ORAL | 0 refills | Status: DC

## 2019-03-07 MED ORDER — LORATADINE 10 MG PO TABS: 10.0000 mg | ORAL_TABLET | Freq: Every day | ORAL | 11 refills | Status: DC

## 2019-03-07 MED ORDER — CETIRIZINE HCL 10 MG PO TABS: 10.0000 mg | ORAL_TABLET | Freq: Every day | ORAL | 11 refills | Status: DC

## 2019-03-07 NOTE — Progress Notes (Signed)
.     Telephone visit  Subjective: ZO:XWRUCC:rash and itching PCP: Remus LofflerJones, Kayden Hutmacher S, PA-C HPI:Stephanie Judie PetitM Renae Gonzalez is a 78 y.o. female calls for telephone consult today. Patient provides verbal consent for consult held via phone.  Patient is identified with 2 separate identifiers.  At this time the entire area is on COVID-19 social distancing and stay home orders are in place.  Patient is of higher risk and therefore we are performing this by a virtual method.  Location of patient: home Location of provider: [facility] Others present for call: no  The patient reports that over the past week she has had increasing red rash and itching.  She thought she had gotten into some poison oak but the rash is just not become a poison oak rash.  It will be red with bumps.  She denies any pustules or blisters.  It is not painful.  She has extreme itching.  She will wake up at night scratching significantly she does not know of any exposure she has had to any infectious disease.   ROS: Per HPI  Allergies  Allergen Reactions  . Other     Oranges; juice or nuts   . Tetracycline Rash and Other (See Comments)    Agitation   Past Medical History:  Diagnosis Date  . Allergy   . Arthritis   . ASTHMA 10/06/2007  . Bronchial asthma   . BURSITIS, RIGHT HIP 02/04/2010  . DIABETES MELLITUS, TYPE II 11/01/2009  . Family history of adverse reaction to anesthesia    Mother had n/v  . H/O vertigo   . History of kidney stones    passed on her own  . HYPERLIPIDEMIA 10/06/2007  . HYPERTENSION 10/06/2007  . Pneumonia   . Restless leg syndrome     Current Outpatient Medications:  .  albuterol (PROAIR HFA) 108 (90 BASE) MCG/ACT inhaler, Inhale 2 puffs into the lungs every 6 (six) hours as needed for wheezing. , Disp: , Rfl:  .  amLODipine (NORVASC) 5 MG tablet, Take 1 tablet (5 mg total) by mouth daily., Disp: 90 tablet, Rfl: 1 .  atorvastatin (LIPITOR) 40 MG tablet, Take 1 tablet (40 mg total) by mouth daily., Disp: 90  tablet, Rfl: 1 .  b complex vitamins tablet, Take 1 tablet by mouth daily., Disp: , Rfl:  .  cetirizine (ZYRTEC) 10 MG tablet, Take 1 tablet (10 mg total) by mouth at bedtime., Disp: 30 tablet, Rfl: 11 .  clonazePAM (KLONOPIN) 1 MG tablet, Take 1 tablet (1 mg total) by mouth at bedtime as needed., Disp: 90 tablet, Rfl: 1 .  famotidine (PEPCID) 20 MG tablet, Take 1 tablet (20 mg total) by mouth 2 (two) times daily., Disp: 60 tablet, Rfl: 5 .  glucose blood (FREESTYLE LITE) test strip, USE TO CHECK BLOOD SUGAR DAILY AND PRN, Disp: 100 each, Rfl: 12 .  HYDROcodone-acetaminophen (NORCO) 10-325 MG tablet, Take 0.5 tablets by mouth 2 (two) times daily as needed for moderate pain. (Patient taking differently: Take 0.5 tablets by mouth every 6 (six) hours as needed for moderate pain. ), Disp: 30 tablet, Rfl: 0 .  Lancets (FREESTYLE) lancets, USE TO CHECK BLOOD SUGAR DAILY AND PRN, Disp: 100 each, Rfl: 12 .  loratadine (CLARITIN) 10 MG tablet, Take 1 tablet (10 mg total) by mouth daily. In the AM, Disp: 30 tablet, Rfl: 11 .  LYSINE PO, Take 1 tablet by mouth daily as needed (for cold sores). , Disp: , Rfl:  .  magic mouthwash  SOLN, Take 5 mLs by mouth 4 (four) times daily., Disp: 100 mL, Rfl: 0 .  metFORMIN (GLUCOPHAGE) 500 MG tablet, Take 1 tablet (500 mg total) by mouth 2 (two) times daily with a meal., Disp: 180 tablet, Rfl: 2 .  predniSONE (STERAPRED UNI-PAK 48 TAB) 10 MG (48) TBPK tablet, Take as directed for 12 days, Disp: 48 tablet, Rfl: 0 .  sertraline (ZOLOFT) 50 MG tablet, Take 0.5-1 tablets (25-50 mg total) by mouth daily., Disp: 30 tablet, Rfl: 5 .  valACYclovir (VALTREX) 500 MG tablet, Take 1 tablet (500 mg total) by mouth 2 (two) times daily., Disp: 20 tablet, Rfl: 5  Assessment/ Plan: 78 y.o. female   1. Rash of entire body - predniSONE (STERAPRED UNI-PAK 48 TAB) 10 MG (48) TBPK tablet; Take as directed for 12 days  Dispense: 48 tablet; Refill: 0 - loratadine (CLARITIN) 10 MG tablet; Take  1 tablet (10 mg total) by mouth daily. In the AM  Dispense: 30 tablet; Refill: 11 - cetirizine (ZYRTEC) 10 MG tablet; Take 1 tablet (10 mg total) by mouth at bedtime.  Dispense: 30 tablet; Refill: 11 - famotidine (PEPCID) 20 MG tablet; Take 1 tablet (20 mg total) by mouth 2 (two) times daily.  Dispense: 60 tablet; Refill: 5  2. Severe itching - predniSONE (STERAPRED UNI-PAK 48 TAB) 10 MG (48) TBPK tablet; Take as directed for 12 days  Dispense: 48 tablet; Refill: 0 - loratadine (CLARITIN) 10 MG tablet; Take 1 tablet (10 mg total) by mouth daily. In the AM  Dispense: 30 tablet; Refill: 11 - cetirizine (ZYRTEC) 10 MG tablet; Take 1 tablet (10 mg total) by mouth at bedtime.  Dispense: 30 tablet; Refill: 11 - famotidine (PEPCID) 20 MG tablet; Take 1 tablet (20 mg total) by mouth 2 (two) times daily.  Dispense: 60 tablet; Refill: 5  3. Hives - predniSONE (STERAPRED UNI-PAK 48 TAB) 10 MG (48) TBPK tablet; Take as directed for 12 days  Dispense: 48 tablet; Refill: 0 - loratadine (CLARITIN) 10 MG tablet; Take 1 tablet (10 mg total) by mouth daily. In the AM  Dispense: 30 tablet; Refill: 11 - cetirizine (ZYRTEC) 10 MG tablet; Take 1 tablet (10 mg total) by mouth at bedtime.  Dispense: 30 tablet; Refill: 11 - famotidine (PEPCID) 20 MG tablet; Take 1 tablet (20 mg total) by mouth 2 (two) times daily.  Dispense: 60 tablet; Refill: 5   No follow-ups on file.  Continue all other maintenance medications as listed above.  Start time: 11:03 AM End time: 11:11 AM  Meds ordered this encounter  Medications  . predniSONE (STERAPRED UNI-PAK 48 TAB) 10 MG (48) TBPK tablet    Sig: Take as directed for 12 days    Dispense:  48 tablet    Refill:  0    Order Specific Question:   Supervising Provider    Answer:   Raliegh Ip [[id number]]  . loratadine (CLARITIN) 10 MG tablet    Sig: Take 1 tablet (10 mg total) by mouth daily. In the AM    Dispense:  30 tablet    Refill:  11    Order Specific Question:    Supervising Provider    Answer:   Raliegh Ip [[id number]]  . cetirizine (ZYRTEC) 10 MG tablet    Sig: Take 1 tablet (10 mg total) by mouth at bedtime.    Dispense:  30 tablet    Refill:  11    Order Specific Question:   Supervising Provider  AnswerJanora Norlander [[id number]]  . famotidine (PEPCID) 20 MG tablet    Sig: Take 1 tablet (20 mg total) by mouth 2 (two) times daily.    Dispense:  60 tablet    Refill:  5    Order Specific Question:   Supervising Provider    Answer:   Janora Norlander [[id number]]    Particia Nearing PA-C [facility] [phone]

## 2019-03-24 ENCOUNTER — Other Ambulatory Visit: Payer: Self-pay | Admitting: Physician Assistant

## 2019-03-24 DIAGNOSIS — G2581 Restless legs syndrome: Secondary | ICD-10-CM

## 2019-03-28 ENCOUNTER — Other Ambulatory Visit: Payer: Self-pay | Admitting: Physician Assistant

## 2019-03-28 DIAGNOSIS — G2581 Restless legs syndrome: Secondary | ICD-10-CM

## 2019-03-28 NOTE — Telephone Encounter (Signed)
Needs to be seen in the office every 6 months for controlled medications

## 2019-03-29 ENCOUNTER — Telehealth: Payer: Self-pay | Admitting: Physician Assistant

## 2019-03-29 NOTE — Telephone Encounter (Signed)
Patient appt made.  

## 2019-03-29 NOTE — Telephone Encounter (Signed)
lmtcb

## 2019-04-06 ENCOUNTER — Other Ambulatory Visit: Payer: Self-pay

## 2019-04-07 ENCOUNTER — Ambulatory Visit (INDEPENDENT_AMBULATORY_CARE_PROVIDER_SITE_OTHER): Payer: Medicare Other

## 2019-04-07 DIAGNOSIS — Z23 Encounter for immunization: Secondary | ICD-10-CM

## 2019-04-15 ENCOUNTER — Other Ambulatory Visit: Payer: Self-pay

## 2019-04-18 ENCOUNTER — Telehealth: Payer: Self-pay | Admitting: Physician Assistant

## 2019-04-18 ENCOUNTER — Other Ambulatory Visit: Payer: Self-pay

## 2019-04-18 ENCOUNTER — Encounter: Payer: Self-pay | Admitting: Physician Assistant

## 2019-04-18 ENCOUNTER — Ambulatory Visit (INDEPENDENT_AMBULATORY_CARE_PROVIDER_SITE_OTHER): Payer: Medicare Other | Admitting: Physician Assistant

## 2019-04-18 VITALS — BP 131/68 | HR 70 | Temp 97.8°F | Ht 64.5 in | Wt 167.2 lb

## 2019-04-18 DIAGNOSIS — G2581 Restless legs syndrome: Secondary | ICD-10-CM

## 2019-04-18 DIAGNOSIS — L299 Pruritus, unspecified: Secondary | ICD-10-CM

## 2019-04-18 DIAGNOSIS — E785 Hyperlipidemia, unspecified: Secondary | ICD-10-CM

## 2019-04-18 DIAGNOSIS — E119 Type 2 diabetes mellitus without complications: Secondary | ICD-10-CM | POA: Diagnosis not present

## 2019-04-18 DIAGNOSIS — L509 Urticaria, unspecified: Secondary | ICD-10-CM

## 2019-04-18 DIAGNOSIS — I10 Essential (primary) hypertension: Secondary | ICD-10-CM

## 2019-04-18 DIAGNOSIS — R21 Rash and other nonspecific skin eruption: Secondary | ICD-10-CM

## 2019-04-18 DIAGNOSIS — F339 Major depressive disorder, recurrent, unspecified: Secondary | ICD-10-CM

## 2019-04-18 LAB — BAYER DCA HB A1C WAIVED: HB A1C (BAYER DCA - WAIVED): 5.1 % (ref <7.0)

## 2019-04-18 MED ORDER — FAMOTIDINE 20 MG PO TABS: 20.0000 mg | ORAL_TABLET | Freq: Two times a day (BID) | ORAL | 5 refills | Status: DC

## 2019-04-18 MED ORDER — SERTRALINE HCL 50 MG PO TABS: 25.0000 mg | ORAL_TABLET | Freq: Every day | ORAL | 5 refills | Status: DC

## 2019-04-18 MED ORDER — AMLODIPINE BESYLATE 5 MG PO TABS: 5.0000 mg | ORAL_TABLET | Freq: Every day | ORAL | 3 refills | Status: DC

## 2019-04-18 MED ORDER — METFORMIN HCL 500 MG PO TABS: 500.0000 mg | ORAL_TABLET | Freq: Two times a day (BID) | ORAL | 3 refills | Status: DC

## 2019-04-18 MED ORDER — ATORVASTATIN CALCIUM 40 MG PO TABS: 40.0000 mg | ORAL_TABLET | Freq: Every day | ORAL | 3 refills | Status: DC

## 2019-04-18 MED ORDER — CLONAZEPAM 1 MG PO TABS: 1.0000 mg | ORAL_TABLET | Freq: Every evening | ORAL | 5 refills | Status: DC | PRN

## 2019-04-18 NOTE — Telephone Encounter (Signed)
meds sent to mail order  

## 2019-04-18 NOTE — Progress Notes (Signed)
BP 131/68   Pulse 70   Temp 97.8 F (36.6 C) (Temporal)   Ht 5' 4.5" (1.638 m)   Wt 167 lb 3.2 oz (75.8 kg)   SpO2 98%   BMI 28.26 kg/m    Subjective:    Patient ID: Stephanie Gonzalez, female    DOB: 1940/08/29, 78 y.o.   MRN: [id number]  HPI: Stephanie Gonzalez is a 78 y.o. female presenting on 04/18/2019 for Diabetes and Medical Management of Chronic Issues (6 month )   This patient is here for follow-up on her chronic medical conditions that do include hyperlipidemia, restless legs, hypertension, and diabetes.  We will have labs performed.  She does need some refills on medications.  Everything has been very she denies having any new issues.   ANXIETY ASSESSMENT Cause of anxiety: GAD, restless leg This patient returns for a  month recheck on narcotic use for the above named condition(s)  Current medications- clonazepam 1 mg 1 QHS Other medications tried: multiple Medication side effects- no Any concerns- no Any change in general medical condition- no Effectiveness of current meds- good PMP AWARE website reviewed: Yes Any suspicious activity on PMP Aware: No LME daily dose: 2  Contract on file 08/09/2018 Last UDS  08/09/2018  History of overdose or risk of abuse no  Past Medical History:  Diagnosis Date  . Allergy   . Arthritis   . ASTHMA 10/06/2007  . Bronchial asthma   . BURSITIS, RIGHT HIP 02/04/2010  . DIABETES MELLITUS, TYPE II 11/01/2009  . Family history of adverse reaction to anesthesia    Mother had n/v  . H/O vertigo   . History of kidney stones    passed on her own  . HYPERLIPIDEMIA 10/06/2007  . HYPERTENSION 10/06/2007  . Pneumonia   . Restless leg syndrome    Relevant past medical, surgical, family and social history reviewed and updated as indicated. Interim medical history since our last visit reviewed. Allergies and medications reviewed and updated. DATA REVIEWED: CHART IN EPIC  Family History reviewed for pertinent findings.  Review of Systems   Constitutional: Negative.   HENT: Negative.   Eyes: Negative.   Respiratory: Negative.   Gastrointestinal: Negative.   Genitourinary: Negative.   Neurological: Positive for weakness and numbness.    Allergies as of 04/18/2019      Reactions   Other    Oranges; juice or nuts    Tetracycline Rash, Other (See Comments)   Agitation      Medication List       Accurate as of April 18, 2019  8:43 PM. If you have any questions, ask your nurse or doctor.        STOP taking these medications   loratadine 10 MG tablet Commonly known as: CLARITIN Stopped by: Terald Sleeper, PA-C   predniSONE 10 MG (48) Tbpk tablet Commonly known as: STERAPRED UNI-PAK 48 TAB Stopped by: Terald Sleeper, PA-C     TAKE these medications   amLODipine 5 MG tablet Commonly known as: NORVASC Take 1 tablet (5 mg total) by mouth daily.   atorvastatin 40 MG tablet Commonly known as: LIPITOR Take 1 tablet (40 mg total) by mouth daily.   b complex vitamins tablet Take 1 tablet by mouth daily.   cetirizine 10 MG tablet Commonly known as: ZYRTEC Take 1 tablet (10 mg total) by mouth at bedtime.   clonazePAM 1 MG tablet Commonly known as: KLONOPIN Take 1 tablet (1 mg total)  by mouth at bedtime as needed. Needs to be seen in the every 6 months for controlled medications   famotidine 20 MG tablet Commonly known as: Pepcid Take 1 tablet (20 mg total) by mouth 2 (two) times daily.   freestyle lancets USE TO CHECK BLOOD SUGAR DAILY AND PRN   glucose blood test strip Commonly known as: FREESTYLE LITE USE TO CHECK BLOOD SUGAR DAILY AND PRN   HYDROcodone-acetaminophen 10-325 MG tablet Commonly known as: NORCO Take 0.5 tablets by mouth 2 (two) times daily as needed for moderate pain. What changed: when to take this   LYSINE PO Take 1 tablet by mouth daily as needed (for cold sores).   magic mouthwash Soln Take 5 mLs by mouth 4 (four) times daily.   metFORMIN 500 MG tablet Commonly known as:  GLUCOPHAGE Take 1 tablet (500 mg total) by mouth 2 (two) times daily with a meal.   ProAir HFA 108 (90 Base) MCG/ACT inhaler Generic drug: albuterol Inhale 2 puffs into the lungs every 6 (six) hours as needed for wheezing.   sertraline 50 MG tablet Commonly known as: Zoloft Take 0.5-1 tablets (25-50 mg total) by mouth daily.   valACYclovir 500 MG tablet Commonly known as: VALTREX Take 1 tablet (500 mg total) by mouth 2 (two) times daily.          Objective:    BP 131/68   Pulse 70   Temp 97.8 F (36.6 C) (Temporal)   Ht 5' 4.5" (1.638 m)   Wt 167 lb 3.2 oz (75.8 kg)   SpO2 98%   BMI 28.26 kg/m   Allergies  Allergen Reactions  . Other     Oranges; juice or nuts   . Tetracycline Rash and Other (See Comments)    Agitation    Wt Readings from Last 3 Encounters:  04/18/19 167 lb 3.2 oz (75.8 kg)  08/09/18 173 lb (78.5 kg)  01/28/18 169 lb (76.7 kg)    Physical Exam Constitutional:      General: She is not in acute distress.    Appearance: Normal appearance. She is well-developed.  HENT:     Head: Normocephalic and atraumatic.  Cardiovascular:     Rate and Rhythm: Normal rate.  Pulmonary:     Effort: Pulmonary effort is normal.  Skin:    General: Skin is warm and dry.     Findings: No rash.  Neurological:     Mental Status: She is alert and oriented to person, place, and time.     Deep Tendon Reflexes: Reflexes are normal and symmetric.     Results for orders placed or performed in visit on 04/18/19  hgba1c  Result Value Ref Range   HB A1C (BAYER DCA - WAIVED) 5.1 <7.0 %      Assessment & Plan:   1. Diabetes mellitus without complication ([place]) - JOAC1Y - Microalbumin / creatinine urine ratio - CMP14+EGFR  2. Essential hypertension - CMP14+EGFR  3. Dyslipidemia - Lipid Panel - CMP14+EGFR  4. Restless legs syndrome (RLS) - clonazePAM (KLONOPIN) 1 MG tablet; Take 1 tablet (1 mg total) by mouth at bedtime as needed. Needs to be seen in the  every 6 months for controlled medications  Dispense: 30 tablet; Refill: 5  5. Rash of entire body  6. Severe itching  7. Hives  8. Depression, recurrent ([place]) Continue medications   Continue all other maintenance medications as listed above.  Follow up plan: Return in about 6 months (around 10/16/2019).  Educational handout  given for [place] PA-C [facility] [address]  [place], [place] [address] [phone]   04/18/2019, 8:43 PM

## 2019-04-18 NOTE — Patient Instructions (Signed)

## 2019-04-19 LAB — CMP14+EGFR
ALT: 11 IU/L (ref 0–32)
AST: 17 IU/L (ref 0–40)
Albumin/Globulin Ratio: 1.4 (ref 1.2–2.2)
Albumin: 4.2 g/dL (ref 3.7–4.7)
Alkaline Phosphatase: 113 IU/L (ref 39–117)
BUN/Creatinine Ratio: 19 (ref 12–28)
BUN: 14 mg/dL (ref 8–27)
Bilirubin Total: 0.5 mg/dL (ref 0.0–1.2)
CO2: 28 mmol/L (ref 20–29)
Calcium: 9.3 mg/dL (ref 8.7–10.3)
Chloride: 99 mmol/L (ref 96–106)
Creatinine, Ser: 0.75 mg/dL (ref 0.57–1.00)
GFR calc Af Amer: 89 mL/min/{1.73_m2} (ref >59)
GFR calc non Af Amer: 77 mL/min/{1.73_m2} (ref >59)
Globulin, Total: 3 g/dL (ref 1.5–4.5)
Glucose: 95 mg/dL (ref 65–99)
Potassium: 4.2 mmol/L (ref 3.5–5.2)
Sodium: 140 mmol/L (ref 134–144)
Total Protein: 7.2 g/dL (ref 6.0–8.5)

## 2019-04-19 LAB — MICROALBUMIN / CREATININE URINE RATIO
Creatinine, Urine: 276.1 mg/dL
Microalb/Creat Ratio: 9 mg/g creat (ref 0–29)
Microalbumin, Urine: 23.6 ug/mL

## 2019-04-19 LAB — LIPID PANEL
Chol/HDL Ratio: 6 ratio — ABNORMAL HIGH (ref 0.0–4.4)
Cholesterol, Total: 197 mg/dL (ref 100–199)
HDL: 33 mg/dL — ABNORMAL LOW (ref >39)
LDL Chol Calc (NIH): 91 mg/dL (ref 0–99)
Triglycerides: 439 mg/dL — ABNORMAL HIGH (ref 0–149)
VLDL Cholesterol Cal: 73 mg/dL — ABNORMAL HIGH (ref 5–40)

## 2019-04-25 ENCOUNTER — Other Ambulatory Visit: Payer: Self-pay | Admitting: Physician Assistant

## 2019-04-25 ENCOUNTER — Telehealth: Payer: Self-pay | Admitting: Physician Assistant

## 2019-04-25 DIAGNOSIS — G2581 Restless legs syndrome: Secondary | ICD-10-CM

## 2019-04-25 MED ORDER — CLONAZEPAM 1 MG PO TABS: 1.0000 mg | ORAL_TABLET | Freq: Every day | ORAL | 1 refills | Status: DC

## 2019-04-25 NOTE — Telephone Encounter (Signed)
sent 

## 2019-04-26 NOTE — Telephone Encounter (Signed)
Aware of new script. 

## 2019-04-27 ENCOUNTER — Other Ambulatory Visit: Payer: Self-pay

## 2019-04-27 ENCOUNTER — Ambulatory Visit (INDEPENDENT_AMBULATORY_CARE_PROVIDER_SITE_OTHER): Payer: Medicare Other | Admitting: *Deleted

## 2019-04-27 DIAGNOSIS — Z0000 Encounter for general adult medical examination without abnormal findings: Secondary | ICD-10-CM | POA: Diagnosis not present

## 2019-04-27 NOTE — Progress Notes (Signed)
MEDICARE ANNUAL WELLNESS VISIT  04/27/2019  Telephone Visit Disclaimer This Medicare AWV was conducted by telephone due to national recommendations for restrictions regarding the COVID-19 Pandemic (e.g. social distancing).  I verified, using two identifiers, that I am speaking with Stephanie Gonzalez or their authorized healthcare agent. I discussed the limitations, risks, security, and privacy concerns of performing an evaluation and management service by telephone and the potential availability of an in-person appointment in the future. The patient expressed understanding and agreed to proceed.   Subjective:  Stephanie Gonzalez is a 78 y.o. female patient of Remus LofflerJones, Angel S, PA-C who had a Medicare Annual Wellness Visit today via telephone. Stephanie KneeSue is Retired and lives with their spouse. she has 3 children. she reports that she is socially active and does interact with friends/family regularly. she is minimally physically active and enjoys reading, cooking and canning.  Patient Care Team: Caryl NeverJones, Angel S, PA-C as PCP - General (Physician Assistant)  Advanced Directives 04/27/2019 08/04/2017 12/25/2016 12/24/2016 07/01/2016 07/10/2014  Does Patient Have a Medical Advance Directive? Yes No No No No No  Type of Advance Directive Living will - - - - -  Does patient want to make changes to medical advance directive? No - Patient declined - - - - -  Would patient like information on creating a medical advance directive? - - No - Patient declined No - Patient declined - No - patient declined information    Hospital Utilization Over the Past 12 Months: # of hospitalizations or ER visits: 0 # of surgeries: 0  Review of Systems    Patient reports that her overall health is unchanged compared to last year.  History obtained from chart review  Patient Reported Readings (BP, Pulse, CBG, Weight, etc) none  Pain Assessment Pain : No/denies pain     Current Medications & Allergies (verified) Allergies as  of 04/27/2019      Reactions   Other    Oranges; juice or nuts    Tetracycline Rash, Other (See Comments)   Agitation      Medication List       Accurate as of April 27, 2019  2:15 PM. If you have any questions, ask your nurse or doctor.        amLODipine 5 MG tablet Commonly known as: NORVASC Take 1 tablet (5 mg total) by mouth daily.   atorvastatin 40 MG tablet Commonly known as: LIPITOR Take 1 tablet (40 mg total) by mouth daily.   b complex vitamins tablet Take 1 tablet by mouth daily.   cetirizine 10 MG tablet Commonly known as: ZYRTEC Take 1 tablet (10 mg total) by mouth at bedtime.   clonazePAM 1 MG tablet Commonly known as: KLONOPIN Take 1 tablet (1 mg total) by mouth at bedtime. Needs to be seen in the every 6 months for controlled medications   famotidine 20 MG tablet Commonly known as: Pepcid Take 1 tablet (20 mg total) by mouth 2 (two) times daily.   freestyle lancets USE TO CHECK BLOOD SUGAR DAILY AND PRN   glucose blood test strip Commonly known as: FREESTYLE LITE USE TO CHECK BLOOD SUGAR DAILY AND PRN   HYDROcodone-acetaminophen 10-325 MG tablet Commonly known as: NORCO Take 0.5 tablets by mouth 2 (two) times daily as needed for moderate pain. What changed: when to take this   LYSINE PO Take 1 tablet by mouth daily as needed (for cold sores).   magic mouthwash Soln Take 5 mLs by mouth  4 (four) times daily.   metFORMIN 500 MG tablet Commonly known as: GLUCOPHAGE Take 1 tablet (500 mg total) by mouth 2 (two) times daily with a meal.   ProAir HFA 108 (90 Base) MCG/ACT inhaler Generic drug: albuterol Inhale 2 puffs into the lungs every 6 (six) hours as needed for wheezing.   sertraline 50 MG tablet Commonly known as: Zoloft Take 0.5-1 tablets (25-50 mg total) by mouth daily.   valACYclovir 500 MG tablet Commonly known as: VALTREX Take 1 tablet (500 mg total) by mouth 2 (two) times daily.       History (reviewed): Past Medical  History:  Diagnosis Date  . Allergy   . Arthritis   . ASTHMA 10/06/2007  . Bronchial asthma   . BURSITIS, RIGHT HIP 02/04/2010  . DIABETES MELLITUS, TYPE II 11/01/2009  . Family history of adverse reaction to anesthesia    Mother had n/v  . H/O vertigo   . History of kidney stones    passed on her own  . HYPERLIPIDEMIA 10/06/2007  . HYPERTENSION 10/06/2007  . Pneumonia   . Restless leg syndrome    Past Surgical History:  Procedure Laterality Date  . ABDOMINAL HYSTERECTOMY    . APPENDECTOMY    . CARDIAC CATHETERIZATION  1992   ? 90's  . CHOLECYSTECTOMY    . COLONOSCOPY    . Gonzalez ARTHROSCOPY Right   . SPINAL CORD STIMULATOR INSERTION N/A 07/13/2014   Procedure: LUMBAR SPINAL CORD STIMULATOR INSERTION;  Surgeon: Venita Lick, MD;  Location: [facility] OR;  Service: Orthopedics;  Laterality: N/A;  . SPINE SURGERY    . TONSILLECTOMY    . TUBAL LIGATION     Family History  Problem Relation Age of Onset  . Alzheimer's disease Mother   . Cancer Father   . Alcoholism Father   . Breast cancer Neg Hx    Social History   Socioeconomic History  . Marital status: Married    Spouse name: Fayrene Fearing  . Number of children: 3  . Years of education: 31  . Highest education level: Some college, no degree  Occupational History    Employer: RETIRED  Social Needs  . Financial resource strain: Not hard at all  . Food insecurity    Worry: Never true    Inability: Never true  . Transportation needs    Medical: No    Non-medical: No  Tobacco Use  . Smoking status: Never Smoker  . Smokeless tobacco: Never Used  Substance and Sexual Activity  . Alcohol use: No  . Drug use: No  . Sexual activity: Not Currently    Birth control/protection: Surgical  Lifestyle  . Physical activity    Days per week: 0 days    Minutes per session: 0 min  . Stress: Not at all  Relationships  . Social connections    Talks on phone: More than three times a week    Gets together: More than three times a week     Attends religious service: More than 4 times per year    Active member of club or organization: Yes    Attends meetings of clubs or organizations: More than 4 times per year    Relationship status: Married  Other Topics Concern  . Not on file  Social History Narrative  . Not on file    Activities of Daily Living In your present state of health, do you have any difficulty performing the following activities: 04/27/2019  Hearing? Y  Comment she doesn't  here as good as she used to  Vision? Y  Comment has cataracts that need to be removed-wears glasses-also has macular degeneration  Difficulty concentrating or making decisions? N  Walking or climbing stairs? N  Dressing or bathing? N  Doing errands, shopping? N  Preparing Food and eating ? N  Using the Toilet? N  In the past six months, have you accidently leaked urine? N  Do you have problems with loss of bowel control? N  Managing your Medications? N  Managing your Finances? N  Housekeeping or managing your Housekeeping? N  Some recent data might be hidden    Patient Education/ Literacy How often do you need to have someone help you when you read instructions, pamphlets, or other written materials from your doctor or pharmacy?: 1 - Never What is the last grade level you completed in school?: 1 year of college-no degree  Exercise Current Exercise Habits: The patient does not participate in regular exercise at present, Exercise limited by: respiratory conditions(s)  Diet Patient reports consuming 2 meals a day and 1 snack(s) a day Patient reports that her primary diet is: Regular Patient reports that she does have regular access to food.   Depression Screen PHQ 2/9 Scores 04/27/2019 04/18/2019 08/09/2018 01/28/2018 08/04/2017 07/01/2016 06/13/2014  PHQ - 2 Score 0 3 0 0 0 0 1  PHQ- 9 Score - 8 - - - - -     Fall Risk Fall Risk  04/27/2019 08/09/2018 01/28/2018 08/04/2017 07/01/2016  Falls in the past year? 0 0 No No No      Objective:  Stephanie Gonzalez seemed alert and oriented and she participated appropriately during our telephone visit.  Blood Pressure Weight BMI  BP Readings from Last 3 Encounters:  04/18/19 131/68  08/09/18 127/76  01/28/18 130/64   Wt Readings from Last 3 Encounters:  04/18/19 167 lb 3.2 oz (75.8 kg)  08/09/18 173 lb (78.5 kg)  01/28/18 169 lb (76.7 kg)   BMI Readings from Last 1 Encounters:  04/18/19 28.26 kg/m    *Unable to obtain current vital signs, weight, and BMI due to telephone visit type  Hearing/Vision  . Melvin did not seem to have difficulty with hearing/understanding during the telephone conversation . Reports that she has had a formal eye exam by an eye care professional within the past year . Reports that she has not had a formal hearing evaluation within the past year *Unable to fully assess hearing and vision during telephone visit type  Cognitive Function: 6CIT Screen 04/27/2019  What Year? 0 points  What month? 0 points  What time? 0 points  Count back from 20 0 points  Months in reverse 0 points  Repeat phrase 0 points  Total Score 0   (Normal:0-7, Significant for Dysfunction: >8)  Normal Cognitive Function Screening: Yes   Immunization & Health Maintenance Record Immunization History  Administered Date(s) Administered  . Fluad Quad(high Dose 65+) 04/07/2019  . Influenza Split 04/16/2012  . Influenza Whole 03/24/2007, 03/09/2008, 03/06/2009, 02/04/2010, 02/18/2011  . Influenza, High Dose Seasonal PF 03/26/2016, 03/24/2017, 03/23/2018  . Influenza,inj,Quad PF,6+ Mos 04/04/2013, 04/10/2014, 03/09/2015  . Influenza,inj,quad, With Preservative 03/09/2017  . Pneumococcal Conjugate-13 06/13/2014  . Pneumococcal Polysaccharide-23 04/02/2010  . Td 04/02/2010  . Zoster 02/18/2011    Health Maintenance  Topic Date Due  . FOOT EXAM  08/04/2018  . OPHTHALMOLOGY EXAM  10/29/2018  . HEMOGLOBIN A1C  10/16/2019  . TETANUS/TDAP  04/02/2020  . URINE  MICROALBUMIN  04/17/2020  .  INFLUENZA VACCINE  Completed  . DEXA SCAN  Completed  . PNA vac Low Risk Adult  Completed       Assessment  This is a routine wellness examination for Stephanie Gonzalez.  Health Maintenance: Due or Overdue Health Maintenance Due  Topic Date Due  . FOOT EXAM  08/04/2018  . OPHTHALMOLOGY EXAM  10/29/2018    Stephanie Gonzalez does not need a referral for Community Assistance: Care Management:   no Social Work:    no Prescription Assistance:  no Nutrition/Diabetes Education:  no   Plan:  Personalized Goals Goals Addressed            This Visit's Progress   . DIET - INCREASE WATER INTAKE       Try to drink 6-8 glasses of water daily      Personalized Health Maintenance & Screening Recommendations  Advanced directives: has an advanced directive - a copy HAS NOT been provided. Diabetic Eye Exam  Lung Cancer Screening Recommended: no (Low Dose CT Chest recommended if Age 64-80 years, 30 pack-year currently smoking OR have quit w/in past 15 years) Hepatitis C Screening recommended: no HIV Screening recommended: no  Advanced Directives: Written information was not prepared per patient's request.  Referrals & Orders No orders of the defined types were placed in this encounter.   Follow-up Plan . Follow-up with Terald Sleeper, PA-C as planned . Schedule your Diabetic Eye Exam as discussed . Bring a copy of your Advanced Directives in for our records   I have personally reviewed and noted the following in the patient's chart:   . Medical and social history . Use of alcohol, tobacco or illicit drugs  . Current medications and supplements . Functional ability and status . Nutritional status . Physical activity . Advanced directives . List of other physicians . Hospitalizations, surgeries, and ER visits in previous 12 months . Vitals . Screenings to include cognitive, depression, and falls . Referrals and appointments  In addition, I have  reviewed and discussed with Stephanie Gonzalez certain preventive protocols, quality metrics, and best practice recommendations. A written personalized care plan for preventive services as well as general preventive health recommendations is available and can be mailed to the patient at her request.      Milas Hock, LPN  [phone]

## 2019-04-27 NOTE — Patient Instructions (Signed)
Preventive Care 77 Years and Older, Female Preventive care refers to lifestyle choices and visits with your health care provider that can promote health and wellness. This includes:  A yearly physical exam. This is also called an annual well check.  Regular dental and eye exams.  Immunizations.  Screening for certain conditions.  Healthy lifestyle choices, such as diet and exercise. What can I expect for my preventive care visit? Physical exam Your health care provider will check:  Height and weight. These may be used to calculate body mass index (BMI), which is a measurement that tells if you are at a healthy weight.  Heart rate and blood pressure.  Your skin for abnormal spots. Counseling Your health care provider may ask you questions about:  Alcohol, tobacco, and drug use.  Emotional well-being.  Home and relationship well-being.  Sexual activity.  Eating habits.  History of falls.  Memory and ability to understand (cognition).  Work and work Statistician.  Pregnancy and menstrual history. What immunizations do I need?  Influenza (flu) vaccine  This is recommended every year. Tetanus, diphtheria, and pertussis (Tdap) vaccine  You may need a Td booster every 10 years. Varicella (chickenpox) vaccine  You may need this vaccine if you have not already been vaccinated. Zoster (shingles) vaccine  You may need this after age 33. Pneumococcal conjugate (PCV13) vaccine  One dose is recommended after age 33. Pneumococcal polysaccharide (PPSV23) vaccine  One dose is recommended after age 72. Measles, mumps, and rubella (MMR) vaccine  You may need at least one dose of MMR if you were born in 1957 or later. You may also need a second dose. Meningococcal conjugate (MenACWY) vaccine  You may need this if you have certain conditions. Hepatitis A vaccine  You may need this if you have certain conditions or if you travel or work in places where you may be exposed  to hepatitis A. Hepatitis B vaccine  You may need this if you have certain conditions or if you travel or work in places where you may be exposed to hepatitis B. Haemophilus influenzae type b (Hib) vaccine  You may need this if you have certain conditions. You may receive vaccines as individual doses or as more than one vaccine together in one shot (combination vaccines). Talk with your health care provider about the risks and benefits of combination vaccines. What tests do I need? Blood tests  Lipid and cholesterol levels. These may be checked every 5 years, or more frequently depending on your overall health.  Hepatitis C test.  Hepatitis B test. Screening  Lung cancer screening. You may have this screening every year starting at age 39 if you have a 30-pack-year history of smoking and currently smoke or have quit within the past 15 years.  Colorectal cancer screening. All adults should have this screening starting at age 36 and continuing until age 15. Your health care provider may recommend screening at age 23 if you are at increased risk. You will have tests every 1-10 years, depending on your results and the type of screening test.  Diabetes screening. This is done by checking your blood sugar (glucose) after you have not eaten for a while (fasting). You may have this done every 1-3 years.  Mammogram. This may be done every 1-2 years. Talk with your health care provider about how often you should have regular mammograms.  BRCA-related cancer screening. This may be done if you have a family history of breast, ovarian, tubal, or peritoneal cancers.  Other tests  Sexually transmitted disease (STD) testing.  Bone density scan. This is done to screen for osteoporosis. You may have this done starting at age 55. Follow these instructions at home: Eating and drinking  Eat a diet that includes fresh fruits and vegetables, whole grains, lean protein, and low-fat dairy products. Limit  your intake of foods with high amounts of sugar, saturated fats, and salt.  Take vitamin and mineral supplements as recommended by your health care provider.  Do not drink alcohol if your health care provider tells you not to drink.  If you drink alcohol: ? Limit how much you have to 0-1 drink a day. ? Be aware of how much alcohol is in your drink. In the [place] one drink equals one 12 oz bottle of beer (355 mL), one 5 oz glass of wine (148 mL), or one 1 oz glass of hard liquor (44 mL). Lifestyle  Take daily care of your teeth and gums.  Stay active. Exercise for at least 30 minutes on 5 or more days each week.  Do not use any products that contain nicotine or tobacco, such as cigarettes, e-cigarettes, and chewing tobacco. If you need help quitting, ask your health care provider.  If you are sexually active, practice safe sex. Use a condom or other form of protection in order to prevent STIs (sexually transmitted infections).  Talk with your health care provider about taking a low-dose aspirin or statin. What's next?  Go to your health care provider once a year for a well check visit.  Ask your health care provider how often you should have your eyes and teeth checked.  Stay up to date on all vaccines. This information is not intended to replace advice given to you by your health care provider. Make sure you discuss any questions you have with your health care provider. Document Released: 06/22/2015 Document Revised: 05/20/2018 Document Reviewed: 05/20/2018 Elsevier Patient Education  2020 [employer].

## 2019-05-02 ENCOUNTER — Other Ambulatory Visit: Payer: Self-pay | Admitting: Physician Assistant

## 2019-05-02 ENCOUNTER — Other Ambulatory Visit: Payer: Self-pay | Admitting: *Deleted

## 2019-05-02 MED ORDER — MAGIC MOUTHWASH: 5.0000 mL | Freq: Four times a day (QID) | ORAL | 0 refills | Status: DC

## 2019-05-03 ENCOUNTER — Telehealth: Payer: Self-pay | Admitting: Physician Assistant

## 2019-05-03 NOTE — Telephone Encounter (Signed)
Spoke with [facility] and they stated that they received the Rx yesterday and the pharmacist that was working would not fill because they had a question on directions. Pharmacy today used a RF on file and went ahead and filled. Left detailed message that rx was ready for patient to pick up

## 2019-05-03 NOTE — Telephone Encounter (Signed)
If you look in her record the medication was sent yesterday [facility].  Please call them and make sure that they received it on their end.  This medication does not have an [place] number because it is compounded.  Therefore it has to be faxed and not sent electronically.  Please let me know if there is any difficulty

## 2019-05-27 ENCOUNTER — Other Ambulatory Visit: Payer: Self-pay | Admitting: Physician Assistant

## 2019-05-27 NOTE — Telephone Encounter (Signed)
Advised patient to check with pharmacy and insurance and she advised that she would.

## 2019-05-27 NOTE — Telephone Encounter (Signed)
What is the name of the medication? Clozapan/ pt wants 90 day supply because it is cheaper. She is upset that is was sent in for 3 scripts for 30 day supply  Have you contacted your pharmacy to request a refill? yes  [facility]   Patient notified that their request is being sent to the clinical staff for review and that they should receive a call once it is complete. If they do not receive a call within 24 hours they can check with their pharmacy or our office.

## 2019-05-27 NOTE — Telephone Encounter (Signed)
See below, I sent it as a 90 day supply with one refill.  The filling of it as #30 is happening on the insurance or pharmacy end. She will need to check with them  Glenard Haring  Medication clonazePAM (KLONOPIN) 1 MG tablet [9638] clonazePAM (KLONOPIN) 1 MG tablet [[id number]]    Order Details Dose: 1 mg Route: Oral Frequency: Daily at bedtime  Dispense Quantity: 90 tablet Refills: 1   Note to Pharmacy: This request is for a new prescription for a controlled substance as required by Federal/State law.       Sig: Take 1 tablet (1 mg total) by mouth at bedtime. Needs to be seen in the every 6 months for controlled medications       Start Date: 04/25/19 End Date: --  Written Date: 04/25/19 Expiration Date: 10/22/19     Diagnosis Association: Restless legs syndrome (RLS) (G25.81)  Original Order:  clonazePAM (KLONOPIN) 1 MG tablet [[id number]]     Order Questions  Question Answer Comment  Supervising Provider Janora Norlander       Providers  Authorizing Provider: Theodoro Clock NPI: [id number]   DEA #: [id number]  Supervising Provider: Janora Norlander, DO NPI: [id number]   DEA #: [id number]  Ordering User:  Terald Sleeper, PA-C        Pharmacy  [id number] [place] [place]  [address] [place], [place] [address]  Phone:  [phone]  Fax:  [phone]  DEA #:  [id number]       Order Class  Normal  All Administrations of clonazePAM (KLONOPIN) 1 MG tablet  (suggestion)  The administrations shown are only for this specific order and not for other orders for the same medication that may be in this encounter.  No Administrations Recorded         Med Administrations and Associated Flowsheet Values (last 96 hours)  None  Warnings History  Total number of overridden warnings: 1  Full Warnings History  Order History Outpatient Date/Time Action Taken User Additional Information  04/25/19 1703 Sign Theodoro Clock Reorder from  [id number]  04/27/19 [place], Donny Pique, LPN   Tracking Links Cosign Tracking Order Transmittal Tracking  Outpatient Medication Detail   Disp Refills Start End   clonazePAM (KLONOPIN) 1 MG tablet 90 tablet 1 04/25/2019    Sig - Route: Take 1 tablet (1 mg total) by mouth at bedtime. Needs to be seen in the every 6 months for controlled medications - Oral   Sent to pharmacy as: clonazePAM (KLONOPIN) 1 MG tablet   Notes to Pharmacy: This request is for a new prescription for a controlled substance as required by Federal/State law.   E-Prescribing Status: Receipt confirmed by pharmacy (04/25/2019  5:03 PM EST)

## 2019-07-25 ENCOUNTER — Other Ambulatory Visit: Payer: Self-pay | Admitting: Family Medicine

## 2019-07-25 DIAGNOSIS — Z1231 Encounter for screening mammogram for malignant neoplasm of breast: Secondary | ICD-10-CM

## 2019-08-03 ENCOUNTER — Telehealth: Payer: Self-pay | Admitting: Physician Assistant

## 2019-08-03 NOTE — Telephone Encounter (Signed)
Patient aware zyrtec sent in

## 2019-08-10 ENCOUNTER — Other Ambulatory Visit: Payer: Self-pay | Admitting: Physician Assistant

## 2019-08-23 ENCOUNTER — Telehealth: Payer: Self-pay | Admitting: Physician Assistant

## 2019-08-23 NOTE — Telephone Encounter (Signed)
Patient has a follow up appointment scheduled for discussing medications in April.

## 2019-08-23 NOTE — Telephone Encounter (Signed)
Patient would like to speak to [facility] personally.  Not happy with the way nurse spoke to her.  She understands she cannot get medicine that was lost refilled, but is there an alternative she can take.  She doesn't what to do about her restless legs.

## 2019-08-23 NOTE — Telephone Encounter (Signed)
Stolen or lost prescriptions cannot be replaced per our controlled substance agreement

## 2019-08-23 NOTE — Telephone Encounter (Signed)
Aware of contract rules but insisted message be given to pcp.

## 2019-08-24 NOTE — Telephone Encounter (Signed)
I did call the patient and leave a voicemail on her home voicemail number apologizing for the inability to refill her clonazepam due to our regulation been typed concerning these type of medications.  I did offer to send her Mirapex or Requip which are medications for restless legs for her to take until she comes for her appointment in April

## 2019-08-25 ENCOUNTER — Other Ambulatory Visit: Payer: Self-pay

## 2019-08-25 ENCOUNTER — Ambulatory Visit
Admission: RE | Admit: 2019-08-25 | Discharge: 2019-08-25 | Disposition: A | Discharge status: Home / Self Care | Payer: Medicare Other | Source: Ambulatory Visit | Attending: Family Medicine | Admitting: Family Medicine

## 2019-08-25 DIAGNOSIS — Z1231 Encounter for screening mammogram for malignant neoplasm of breast: Secondary | ICD-10-CM

## 2019-08-25 NOTE — Telephone Encounter (Signed)
Stephanie Gonzalez called back and wanted to apologize to everyone she spoke with about this issue. Says the situation is resolved and pt is aware that it was her fault and is sorry for causing a ruckus.

## 2019-09-20 ENCOUNTER — Ambulatory Visit: Payer: Medicare Other | Admitting: Physician Assistant

## 2019-10-13 ENCOUNTER — Ambulatory Visit: Payer: Medicare Other | Admitting: Family Medicine

## 2019-10-13 ENCOUNTER — Other Ambulatory Visit: Payer: Self-pay

## 2019-10-13 ENCOUNTER — Encounter: Payer: Self-pay | Admitting: Family Medicine

## 2019-10-13 VITALS — BP 130/81 | HR 71 | Temp 98.0°F | Ht 64.5 in | Wt 174.2 lb

## 2019-10-13 DIAGNOSIS — E119 Type 2 diabetes mellitus without complications: Secondary | ICD-10-CM

## 2019-10-13 DIAGNOSIS — E785 Hyperlipidemia, unspecified: Secondary | ICD-10-CM

## 2019-10-13 DIAGNOSIS — F339 Major depressive disorder, recurrent, unspecified: Secondary | ICD-10-CM | POA: Diagnosis not present

## 2019-10-13 DIAGNOSIS — I10 Essential (primary) hypertension: Secondary | ICD-10-CM

## 2019-10-13 DIAGNOSIS — Z79899 Other long term (current) drug therapy: Secondary | ICD-10-CM

## 2019-10-13 LAB — CBC WITH DIFFERENTIAL/PLATELET
Basophils Absolute: 0.1 10*3/uL (ref 0.0–0.2)
Basos: 1 %
EOS (ABSOLUTE): 0.2 10*3/uL (ref 0.0–0.4)
Eos: 3 %
Hematocrit: 39.1 % (ref 34.0–46.6)
Hemoglobin: 13.1 g/dL (ref 11.1–15.9)
Immature Grans (Abs): 0 10*3/uL (ref 0.0–0.1)
Immature Granulocytes: 0 %
Lymphocytes Absolute: 2.9 10*3/uL (ref 0.7–3.1)
Lymphs: 42 %
MCH: 28.5 pg (ref 26.6–33.0)
MCHC: 33.5 g/dL (ref 31.5–35.7)
MCV: 85 fL (ref 79–97)
Monocytes Absolute: 0.5 10*3/uL (ref 0.1–0.9)
Monocytes: 7 %
Neutrophils Absolute: 3.3 10*3/uL (ref 1.4–7.0)
Neutrophils: 47 %
Platelets: 220 10*3/uL (ref 150–450)
RBC: 4.59 x10E6/uL (ref 3.77–5.28)
RDW: 13.1 % (ref 11.7–15.4)
WBC: 6.8 10*3/uL (ref 3.4–10.8)

## 2019-10-13 LAB — CMP14+EGFR
ALT: 7 IU/L (ref 0–32)
AST: 15 IU/L (ref 0–40)
Albumin/Globulin Ratio: 1.4 (ref 1.2–2.2)
Albumin: 4.1 g/dL (ref 3.7–4.7)
Alkaline Phosphatase: 122 IU/L — ABNORMAL HIGH (ref 39–117)
BUN/Creatinine Ratio: 20 (ref 12–28)
BUN: 14 mg/dL (ref 8–27)
Bilirubin Total: 0.5 mg/dL (ref 0.0–1.2)
CO2: 26 mmol/L (ref 20–29)
Calcium: 9.4 mg/dL (ref 8.7–10.3)
Chloride: 100 mmol/L (ref 96–106)
Creatinine, Ser: 0.69 mg/dL (ref 0.57–1.00)
GFR calc Af Amer: 97 mL/min/{1.73_m2} (ref >59)
GFR calc non Af Amer: 84 mL/min/{1.73_m2} (ref >59)
Globulin, Total: 2.9 g/dL (ref 1.5–4.5)
Glucose: 89 mg/dL (ref 65–99)
Potassium: 3.8 mmol/L (ref 3.5–5.2)
Sodium: 140 mmol/L (ref 134–144)
Total Protein: 7 g/dL (ref 6.0–8.5)

## 2019-10-13 LAB — BAYER DCA HB A1C WAIVED: HB A1C (BAYER DCA - WAIVED): 5 % (ref <7.0)

## 2019-10-14 ENCOUNTER — Ambulatory Visit: Payer: Medicare Other | Admitting: Physician Assistant

## 2019-10-17 ENCOUNTER — Encounter: Payer: Self-pay | Admitting: Family Medicine

## 2019-10-17 LAB — TOXASSURE SELECT 13 (MW), URINE

## 2019-10-17 NOTE — Progress Notes (Signed)
PATIENT AWARE OF RESULTS, REQUESTS RESTLESS LEG MEDICATION BE SENT TO [facility]

## 2019-10-17 NOTE — Progress Notes (Signed)
Subjective:  Patient ID: Stephanie Gonzalez, female    DOB: 01-25-1941  Age: 79 y.o. MRN: [id number]  CC: Follow-up (6 month)   HPI Stephanie Gonzalez presents for to establish with me as her primary care after departure of her previous PCP, Stephanie Gonzalez.  She is under the care of Stephanie Gonzalez of orthopedics for chronic back pain.  She takes hydrocodone for this.  See med list.  Additionally she has been taking clonazepam at bedtime for many years for restless legs.  She has tried Permax and ropinirole as well as others for the restless legs.  Nothing was successful with regard to relieving the cramping or getting her a good night sleep until she started taking clonazepam as noted per her med list below.  Patient in for follow-up of elevated cholesterol. Doing well without complaints on current medication. Denies side effects of statin including myalgia and arthralgia and nausea. Also in today for liver function testing. Currently no chest pain, shortness of breath or other cardiovascular related symptoms noted.   Follow-up of hypertension. Patient has no history of headache chest pain or shortness of breath or recent cough. Patient also denies symptoms of TIA such as numbness weakness lateralizing. Patient checks  blood pressure at home and has not had any elevated readings recently. Patient denies side effects from his medication. States taking it regularly.  Follow-up of diabetes. Patient does not check blood sugar at home Patient denies symptoms such as polyuria, polydipsia, excessive hunger, nausea No significant hypoglycemic spells noted. Medications as noted below. Taking them regularly without complication/adverse reaction being reported today. Depression screen [facility] 2/9 10/13/2019 04/27/2019 04/18/2019  Decreased Interest 0 0 2  Down, Depressed, Hopeless 0 0 1  PHQ - 2 Score 0 0 3  Altered sleeping - - 1  Tired, decreased energy - - 2  Change in appetite - - 1  Feeling bad or failure about  yourself  - - 0  Trouble concentrating - - 0  Moving slowly or fidgety/restless - - 1  Suicidal thoughts - - 0  PHQ-9 Score - - 8  Difficult doing work/chores - - Not difficult at all    History Stephanie Gonzalez has a past medical history of Allergy, Arthritis, ASTHMA (10/06/2007), Bronchial asthma, BURSITIS, RIGHT HIP (02/04/2010), DIABETES MELLITUS, TYPE II (11/01/2009), Family history of adverse reaction to anesthesia, H/O vertigo, History of kidney stones, HYPERLIPIDEMIA (10/06/2007), HYPERTENSION (10/06/2007), Pneumonia, and Restless leg syndrome.   She has a past surgical history that includes Appendectomy; Cholecystectomy; Abdominal hysterectomy; Tubal ligation; Tonsillectomy; Knee arthroscopy (Right); Spinal cord stimulator insertion (N/A, 07/13/2014); Colonoscopy; Cardiac catheterization (1992); and Spine surgery.   Her family history includes Alcoholism in her father; Alzheimer's disease in her mother; Cancer in her father.She reports that she has never smoked. She has never used smokeless tobacco. She reports that she does not drink alcohol or use drugs.    ROS Review of Systems  Constitutional: Negative.   HENT: Negative for congestion.   Eyes: Negative for visual disturbance.  Respiratory: Negative for shortness of breath.   Cardiovascular: Negative for chest pain.  Gastrointestinal: Negative for abdominal pain, constipation, diarrhea, nausea and vomiting.  Genitourinary: Negative for difficulty urinating.  Musculoskeletal: Positive for arthralgias, back pain and myalgias.  Neurological: Negative for headaches.  Psychiatric/Behavioral: Positive for sleep disturbance.    Objective:  BP 130/81   Pulse 71   Temp 98 F (36.7 C) (Temporal)   Ht 5' 4.5" (1.638 m)   Wt 174 lb  3.2 oz (79 kg)   BMI 29.44 kg/m   BP Readings from Last 3 Encounters:  10/13/19 130/81  04/18/19 131/68  08/09/18 127/76    Wt Readings from Last 3 Encounters:  10/13/19 174 lb 3.2 oz (79 kg)  04/18/19 167 lb  3.2 oz (75.8 kg)  08/09/18 173 lb (78.5 kg)     Physical Exam Constitutional:      General: She is not in acute distress.    Appearance: She is well-developed.  HENT:     Head: Normocephalic and atraumatic.     Right Ear: External ear normal.     Left Ear: External ear normal.     Nose: Nose normal.  Eyes:     Conjunctiva/sclera: Conjunctivae normal.     Pupils: Pupils are equal, round, and reactive to light.  Neck:     Thyroid: No thyromegaly.  Cardiovascular:     Rate and Rhythm: Normal rate and regular rhythm.     Heart sounds: Normal heart sounds. No murmur.  Pulmonary:     Effort: Pulmonary effort is normal. No respiratory distress.     Breath sounds: Normal breath sounds. No wheezing or rales.  Abdominal:     General: Bowel sounds are normal. There is no distension.     Palpations: Abdomen is soft.     Tenderness: There is no abdominal tenderness.  Musculoskeletal:     Cervical back: Normal range of motion and neck supple.  Lymphadenopathy:     Cervical: No cervical adenopathy.  Skin:    General: Skin is warm and dry.  Neurological:     Mental Status: She is alert and oriented to person, place, and time.     Deep Tendon Reflexes: Reflexes are normal and symmetric.  Psychiatric:        Behavior: Behavior normal.        Thought Content: Thought content normal.        Judgment: Judgment normal.       Assessment & Plan:   Stephanie Gonzalez was seen today for follow-up.  Diagnoses and all orders for this visit:  Diabetes mellitus without complication ([place]) -     CMP14+EGFR -     CBC with Differential/Platelet -     Bayer DCA Hb A1c Waived  Depression, recurrent (HCC) -     CMP14+EGFR -     CBC with Differential/Platelet  Dyslipidemia -     CMP14+EGFR -     CBC with Differential/Platelet  Essential hypertension -     CMP14+EGFR -     CBC with Differential/Platelet  Controlled substance agreement signed -     ToxASSURE Select 13 (MW), Urine       I am  having Stephanie Gonzalez. [name] maintain her albuterol, freestyle, glucose blood, LYSINE PO, b complex vitamins, HYDROcodone-acetaminophen, valACYclovir, cetirizine, sertraline, metFORMIN, atorvastatin, amLODipine, famotidine, clonazePAM, and magic mouthwash.  Allergies as of 10/13/2019      Reactions   Other    Oranges; juice or nuts    Tetracycline Rash, Other (See Comments)   Agitation      Medication List       Accurate as of Oct 13, 2019 11:59 PM. If you have any questions, ask your nurse or doctor.        amLODipine 5 MG tablet Commonly known as: NORVASC Take 1 tablet (5 mg total) by mouth daily.   atorvastatin 40 MG tablet Commonly known as: LIPITOR Take 1 tablet (40 mg total) by mouth daily.  b complex vitamins tablet Take 1 tablet by mouth daily.   cetirizine 10 MG tablet Commonly known as: ZYRTEC Take 1 tablet (10 mg total) by mouth at bedtime.   clonazePAM 1 MG tablet Commonly known as: KLONOPIN Take 1 tablet (1 mg total) by mouth at bedtime. Needs to be seen in the every 6 months for controlled medications   famotidine 20 MG tablet Commonly known as: Pepcid Take 1 tablet (20 mg total) by mouth 2 (two) times daily.   freestyle lancets USE TO CHECK BLOOD SUGAR DAILY AND PRN   glucose blood test strip Commonly known as: FREESTYLE LITE USE TO CHECK BLOOD SUGAR DAILY AND PRN   HYDROcodone-acetaminophen 10-325 MG tablet Commonly known as: NORCO Take 0.5 tablets by mouth 2 (two) times daily as needed for moderate pain. What changed: when to take this   LYSINE PO Take 1 tablet by mouth daily as needed (for cold sores).   magic mouthwash Soln Take 5 mLs by mouth 4 (four) times daily.   metFORMIN 500 MG tablet Commonly known as: GLUCOPHAGE Take 1 tablet (500 mg total) by mouth 2 (two) times daily with a meal.   ProAir HFA 108 (90 Base) MCG/ACT inhaler Generic drug: albuterol Inhale 2 puffs into the lungs every 6 (six) hours as needed for wheezing.   sertraline  50 MG tablet Commonly known as: Zoloft Take 0.5-1 tablets (25-50 mg total) by mouth daily.   valACYclovir 500 MG tablet Commonly known as: VALTREX Take 1 tablet (500 mg total) by mouth 2 (two) times daily.        Follow-up: Return in about 3 months (around 01/13/2020).  Claretta Fraise, M.D.

## 2019-10-18 ENCOUNTER — Other Ambulatory Visit: Payer: Self-pay | Admitting: Family Medicine

## 2019-10-18 DIAGNOSIS — G2581 Restless legs syndrome: Secondary | ICD-10-CM

## 2019-10-18 MED ORDER — CLONAZEPAM 1 MG PO TABS: 1.0000 mg | ORAL_TABLET | Freq: Every day | ORAL | 1 refills | Status: DC

## 2019-12-22 ENCOUNTER — Other Ambulatory Visit: Payer: Self-pay | Admitting: *Deleted

## 2019-12-25 MED ORDER — MAGIC MOUTHWASH: 5.0000 mL | Freq: Four times a day (QID) | ORAL | 0 refills | Status: DC

## 2020-01-17 ENCOUNTER — Other Ambulatory Visit: Payer: Self-pay | Admitting: *Deleted

## 2020-01-17 ENCOUNTER — Telehealth: Payer: Self-pay | Admitting: Family Medicine

## 2020-01-17 DIAGNOSIS — G2581 Restless legs syndrome: Secondary | ICD-10-CM

## 2020-01-17 NOTE — Telephone Encounter (Signed)
[facility]. [facility] was [facility] to [facility] at her last OV. Confirmed this with pt and she is ok to get the next 90 days at [facility]. Scheduled appt for a follow up 02/08/20 with [facility] for a 48m follow up.

## 2020-02-08 ENCOUNTER — Encounter: Payer: Self-pay | Admitting: Family Medicine

## 2020-02-08 ENCOUNTER — Other Ambulatory Visit: Payer: Self-pay

## 2020-02-08 ENCOUNTER — Ambulatory Visit: Payer: Medicare Other | Admitting: Family Medicine

## 2020-02-08 VITALS — BP 129/65 | HR 72 | Temp 97.2°F | Resp 20 | Ht 64.5 in | Wt 168.0 lb

## 2020-02-08 DIAGNOSIS — R21 Rash and other nonspecific skin eruption: Secondary | ICD-10-CM

## 2020-02-08 DIAGNOSIS — E785 Hyperlipidemia, unspecified: Secondary | ICD-10-CM

## 2020-02-08 DIAGNOSIS — E119 Type 2 diabetes mellitus without complications: Secondary | ICD-10-CM

## 2020-02-08 DIAGNOSIS — Z1159 Encounter for screening for other viral diseases: Secondary | ICD-10-CM

## 2020-02-08 DIAGNOSIS — F339 Major depressive disorder, recurrent, unspecified: Secondary | ICD-10-CM

## 2020-02-08 DIAGNOSIS — L299 Pruritus, unspecified: Secondary | ICD-10-CM

## 2020-02-08 DIAGNOSIS — I10 Essential (primary) hypertension: Secondary | ICD-10-CM | POA: Diagnosis not present

## 2020-02-08 DIAGNOSIS — G2581 Restless legs syndrome: Secondary | ICD-10-CM

## 2020-02-08 DIAGNOSIS — L509 Urticaria, unspecified: Secondary | ICD-10-CM

## 2020-02-08 LAB — BAYER DCA HB A1C WAIVED: HB A1C (BAYER DCA - WAIVED): 5 % (ref <7.0)

## 2020-02-08 MED ORDER — CLONAZEPAM 1 MG PO TABS: 1.0000 mg | ORAL_TABLET | Freq: Every day | ORAL | 1 refills | Status: DC

## 2020-02-08 MED ORDER — ATORVASTATIN CALCIUM 40 MG PO TABS: 40.0000 mg | ORAL_TABLET | Freq: Every day | ORAL | 3 refills | Status: DC

## 2020-02-08 MED ORDER — BETAMETHASONE SOD PHOS & ACET 6 (3-3) MG/ML IJ SUSP
6.0000 mg | Freq: Once | INTRAMUSCULAR | Status: AC
  Administered 2020-02-08: 6 mg via INTRAMUSCULAR

## 2020-02-08 MED ORDER — FAMOTIDINE 20 MG PO TABS: 20.0000 mg | ORAL_TABLET | Freq: Two times a day (BID) | ORAL | 5 refills | Status: DC

## 2020-02-08 MED ORDER — AMLODIPINE BESYLATE 5 MG PO TABS: 5.0000 mg | ORAL_TABLET | Freq: Every day | ORAL | 3 refills | Status: DC

## 2020-02-08 MED ORDER — METFORMIN HCL 500 MG PO TABS: 500.0000 mg | ORAL_TABLET | Freq: Every day | ORAL | 3 refills | Status: DC

## 2020-02-08 MED ORDER — SERTRALINE HCL 50 MG PO TABS
25.0000 mg | ORAL_TABLET | Freq: Every day | ORAL | 1 refills | Status: DC
Start: 2020-02-08 — End: 2020-02-14

## 2020-02-08 NOTE — Progress Notes (Signed)
Subjective:  Patient ID: Stephanie Gonzalez,  female    DOB: [birth year]  Age: 79 y.o.    CC: Medical Management of Chronic Issues   HPI Stephanie Gonzalez presents for  follow-up of hypertension. Patient has no history of headache chest pain or shortness of breath or recent cough. Patient also denies symptoms of TIA such as numbness weakness lateralizing. Patient denies side effects from medication. States taking it regularly.  Patient also  in for follow-up of elevated cholesterol. Doing well without complaints on current medication. Denies side effects  including myalgia and arthralgia and nausea. Also in today for liver function testing. Currently no chest pain, shortness of breath or other cardiovascular related symptoms noted.  Follow-up of diabetes. Patient does check blood sugar at home. Readings run between 100 and 120 Patient denies symptoms such as excessive hunger or urinary frequency, excessive hunger, nausea No significant hypoglycemic spells noted. Medications reviewed. Pt reports taking them regularly. Pt. denies complication/adverse reaction today.   Also concern for restless leg syndrome.  She is not resting unless she takes her current medication.  Patient is under the care of pain clinic through Dr. Herma Mering for her lumbar pain.  She is currently taking hydrocodone prescribed by him.  History Stephanie Gonzalez has a past medical history of Allergy, Arthritis, ASTHMA (10/06/2007), Bronchial asthma, BURSITIS, RIGHT HIP (02/04/2010), DIABETES MELLITUS, TYPE II (11/01/2009), Family history of adverse reaction to anesthesia, H/O vertigo, History of kidney stones, HYPERLIPIDEMIA (10/06/2007), HYPERTENSION (10/06/2007), Pneumonia, and Restless leg syndrome.   She has a past surgical history that includes Appendectomy; Cholecystectomy; Abdominal hysterectomy; Tubal ligation; Tonsillectomy; Knee arthroscopy (Right); Spinal cord stimulator insertion (N/A, 07/13/2014); Colonoscopy; Cardiac catheterization (1992);  and Spine surgery.   Her family history includes Alcoholism in her father; Alzheimer's disease in her mother; Cancer in her father.She reports that she has never smoked. She has never used smokeless tobacco. She reports that she does not drink alcohol and does not use drugs.  Current Outpatient Medications on File Prior to Visit  Medication Sig Dispense Refill  . albuterol (PROAIR HFA) 108 (90 BASE) MCG/ACT inhaler Inhale 2 puffs into the lungs every 6 (six) hours as needed for wheezing.     Marland Kitchen b complex vitamins tablet Take 1 tablet by mouth daily.    . cetirizine (ZYRTEC) 10 MG tablet Take 1 tablet (10 mg total) by mouth at bedtime. 30 tablet 11  . glucose blood (FREESTYLE LITE) test strip USE TO CHECK BLOOD SUGAR DAILY AND PRN 100 each 12  . HYDROcodone-acetaminophen (NORCO) 10-325 MG tablet Take 0.5 tablets by mouth 2 (two) times daily as needed for moderate pain. (Patient taking differently: Take 0.5 tablets by mouth every 6 (six) hours as needed for moderate pain. ) 30 tablet 0  . Lancets (FREESTYLE) lancets USE TO CHECK BLOOD SUGAR DAILY AND PRN 100 each 12  . LYSINE PO Take 1 tablet by mouth daily as needed (for cold sores).      No current facility-administered medications on file prior to visit.    ROS Review of Systems  Constitutional: Negative.   HENT: Negative for congestion.   Respiratory: Negative for shortness of breath.   Cardiovascular: Negative for chest pain.  Gastrointestinal: Negative for abdominal pain, constipation, diarrhea, nausea and vomiting.  Genitourinary: Negative for difficulty urinating.  Musculoskeletal: Positive for back pain. Negative for arthralgias and myalgias.  Neurological: Negative for headaches.  Psychiatric/Behavioral: Negative for sleep disturbance.    Objective:  BP 129/65   Pulse 72  Temp (!) 97.2 F (36.2 C) (Temporal)   Resp 20   Ht 5' 4.5" (1.638 m)   Wt 168 lb (76.2 kg)   SpO2 97%   BMI 28.39 kg/m   BP Readings from Last 3  Encounters:  02/08/20 129/65  10/13/19 130/81  04/18/19 131/68    Wt Readings from Last 3 Encounters:  02/08/20 168 lb (76.2 kg)  10/13/19 174 lb 3.2 oz (79 kg)  04/18/19 167 lb 3.2 oz (75.8 kg)     Physical Exam Constitutional:      General: She is not in acute distress.    Appearance: She is well-developed.  HENT:     Head: Normocephalic and atraumatic.  Eyes:     Conjunctiva/sclera: Conjunctivae normal.     Pupils: Pupils are equal, round, and reactive to light.  Neck:     Thyroid: No thyromegaly.  Cardiovascular:     Rate and Rhythm: Normal rate and regular rhythm.     Heart sounds: Normal heart sounds. No murmur heard.   Pulmonary:     Effort: Pulmonary effort is normal. No respiratory distress.     Breath sounds: Normal breath sounds. No wheezing or rales.  Abdominal:     General: Bowel sounds are normal. There is no distension.     Palpations: Abdomen is soft.     Tenderness: There is no abdominal tenderness.  Musculoskeletal:        General: Normal range of motion.     Cervical back: Normal range of motion and neck supple.  Lymphadenopathy:     Cervical: No cervical adenopathy.  Skin:    General: Skin is warm and dry.  Neurological:     Mental Status: She is alert and oriented to person, place, and time.  Psychiatric:        Behavior: Behavior normal.        Thought Content: Thought content normal.        Judgment: Judgment normal.     Diabetic Foot Exam - Simple   No data filed        Assessment & Plan:   Stephanie Gonzalez was seen today for medical management of chronic issues.  Diagnoses and all orders for this visit:  Diabetes mellitus without complication ([place]) -     Bayer DCA Hb A1c Waived -     CBC with Differential/Platelet -     CMP14+EGFR -     Lipid panel -     metFORMIN (GLUCOPHAGE) 500 MG tablet; Take 1 tablet (500 mg total) by mouth daily with breakfast.  Dyslipidemia -     CBC with Differential/Platelet -     CMP14+EGFR -     Lipid  panel -     atorvastatin (LIPITOR) 40 MG tablet; Take 1 tablet (40 mg total) by mouth daily.  Essential hypertension -     CBC with Differential/Platelet -     CMP14+EGFR -     Lipid panel -     amLODipine (NORVASC) 5 MG tablet; Take 1 tablet (5 mg total) by mouth daily.  Encounter for hepatitis C screening test for low risk patient -     Hepatitis C antibody -     CBC with Differential/Platelet -     CMP14+EGFR -     Lipid panel  Restless legs syndrome (RLS) -     clonazePAM (KLONOPIN) 1 MG tablet; Take 1 tablet (1 mg total) by mouth at bedtime. Needs to be seen in the every 6 months for  controlled medications  Rash of entire body -     famotidine (PEPCID) 20 MG tablet; Take 1 tablet (20 mg total) by mouth 2 (two) times daily. -     betamethasone acetate-betamethasone sodium phosphate (CELESTONE) injection 6 mg  Severe itching -     famotidine (PEPCID) 20 MG tablet; Take 1 tablet (20 mg total) by mouth 2 (two) times daily.  Hives -     famotidine (PEPCID) 20 MG tablet; Take 1 tablet (20 mg total) by mouth 2 (two) times daily.  Depression, recurrent ([place]) -     sertraline (ZOLOFT) 50 MG tablet; Take 0.5-1 tablets (25-50 mg total) by mouth daily.   I have discontinued Stephanie Gonzalez. Stephanie Gonzalez's valACYclovir and magic mouthwash. I have also changed her metFORMIN. Additionally, I am having her maintain her albuterol, freestyle, glucose blood, LYSINE PO, b complex vitamins, HYDROcodone-acetaminophen, cetirizine, amLODipine, atorvastatin, clonazePAM, famotidine, and sertraline. We administered betamethasone acetate-betamethasone sodium phosphate.  Meds ordered this encounter  Medications  . amLODipine (NORVASC) 5 MG tablet    Sig: Take 1 tablet (5 mg total) by mouth daily.    Dispense:  90 tablet    Refill:  3  . atorvastatin (LIPITOR) 40 MG tablet    Sig: Take 1 tablet (40 mg total) by mouth daily.    Dispense:  90 tablet    Refill:  3  . clonazePAM (KLONOPIN) 1 MG tablet    Sig: Take  1 tablet (1 mg total) by mouth at bedtime. Needs to be seen in the every 6 months for controlled medications    Dispense:  90 tablet    Refill:  1    This request is for a new prescription for a controlled substance as required by Federal/State law.  . famotidine (PEPCID) 20 MG tablet    Sig: Take 1 tablet (20 mg total) by mouth 2 (two) times daily.    Dispense:  60 tablet    Refill:  5  . metFORMIN (GLUCOPHAGE) 500 MG tablet    Sig: Take 1 tablet (500 mg total) by mouth daily with breakfast.    Dispense:  90 tablet    Refill:  3  . sertraline (ZOLOFT) 50 MG tablet    Sig: Take 0.5-1 tablets (25-50 mg total) by mouth daily.    Dispense:  90 tablet    Refill:  1  . betamethasone acetate-betamethasone sodium phosphate (CELESTONE) injection 6 mg     Follow-up: Return in about 6 months (around 08/07/2020).  Claretta Fraise, M.D.

## 2020-02-10 LAB — CBC WITH DIFFERENTIAL/PLATELET
Basophils Absolute: 0.1 10*3/uL (ref 0.0–0.2)
Basos: 1 %
EOS (ABSOLUTE): 0.3 10*3/uL (ref 0.0–0.4)
Eos: 4 %
Hematocrit: 37.9 % (ref 34.0–46.6)
Hemoglobin: 13.2 g/dL (ref 11.1–15.9)
Immature Grans (Abs): 0 10*3/uL (ref 0.0–0.1)
Immature Granulocytes: 0 %
Lymphocytes Absolute: 3.1 10*3/uL (ref 0.7–3.1)
Lymphs: 39 %
MCH: 29.6 pg (ref 26.6–33.0)
MCHC: 34.8 g/dL (ref 31.5–35.7)
MCV: 85 fL (ref 79–97)
Monocytes Absolute: 0.4 10*3/uL (ref 0.1–0.9)
Monocytes: 6 %
Neutrophils Absolute: 4 10*3/uL (ref 1.4–7.0)
Neutrophils: 50 %
Platelets: 226 10*3/uL (ref 150–450)
RBC: 4.46 x10E6/uL (ref 3.77–5.28)
RDW: 13.3 % (ref 11.7–15.4)
WBC: 7.8 10*3/uL (ref 3.4–10.8)

## 2020-02-10 LAB — CMP14+EGFR
ALT: 10 IU/L (ref 0–32)
AST: 14 IU/L (ref 0–40)
Albumin/Globulin Ratio: 1.4 (ref 1.2–2.2)
Albumin: 4 g/dL (ref 3.7–4.7)
Alkaline Phosphatase: 104 IU/L (ref 48–121)
BUN/Creatinine Ratio: 16 (ref 12–28)
BUN: 11 mg/dL (ref 8–27)
Bilirubin Total: 0.7 mg/dL (ref 0.0–1.2)
CO2: 29 mmol/L (ref 20–29)
Calcium: 9.4 mg/dL (ref 8.7–10.3)
Chloride: 103 mmol/L (ref 96–106)
Creatinine, Ser: 0.67 mg/dL (ref 0.57–1.00)
GFR calc Af Amer: 98 mL/min/{1.73_m2} (ref >59)
GFR calc non Af Amer: 85 mL/min/{1.73_m2} (ref >59)
Globulin, Total: 2.9 g/dL (ref 1.5–4.5)
Glucose: 78 mg/dL (ref 65–99)
Potassium: 4.1 mmol/L (ref 3.5–5.2)
Sodium: 142 mmol/L (ref 134–144)
Total Protein: 6.9 g/dL (ref 6.0–8.5)

## 2020-02-10 LAB — LIPID PANEL
Chol/HDL Ratio: 4.8 ratio — ABNORMAL HIGH (ref 0.0–4.4)
Cholesterol, Total: 193 mg/dL (ref 100–199)
HDL: 40 mg/dL (ref >39)
LDL Chol Calc (NIH): 101 mg/dL — ABNORMAL HIGH (ref 0–99)
Triglycerides: 305 mg/dL — ABNORMAL HIGH (ref 0–149)
VLDL Cholesterol Cal: 52 mg/dL — ABNORMAL HIGH (ref 5–40)

## 2020-02-10 LAB — HEPATITIS C ANTIBODY: Hep C Virus Ab: <0.1 s/co ratio (ref 0.0–0.9)

## 2020-02-14 ENCOUNTER — Other Ambulatory Visit: Payer: Self-pay | Admitting: Family Medicine

## 2020-02-14 DIAGNOSIS — F339 Major depressive disorder, recurrent, unspecified: Secondary | ICD-10-CM

## 2020-02-14 DIAGNOSIS — I10 Essential (primary) hypertension: Secondary | ICD-10-CM

## 2020-02-14 DIAGNOSIS — E785 Hyperlipidemia, unspecified: Secondary | ICD-10-CM

## 2020-02-14 DIAGNOSIS — E119 Type 2 diabetes mellitus without complications: Secondary | ICD-10-CM

## 2020-02-14 MED ORDER — ATORVASTATIN CALCIUM 40 MG PO TABS: 40.0000 mg | ORAL_TABLET | Freq: Every day | ORAL | 3 refills | Status: DC

## 2020-02-14 MED ORDER — SERTRALINE HCL 50 MG PO TABS
25.0000 mg | ORAL_TABLET | Freq: Every day | ORAL | 1 refills | Status: DC
Start: 2020-02-14 — End: 2020-08-07

## 2020-02-14 MED ORDER — METFORMIN HCL 500 MG PO TABS: 500.0000 mg | ORAL_TABLET | Freq: Every day | ORAL | 3 refills | Status: DC

## 2020-02-14 MED ORDER — AMLODIPINE BESYLATE 5 MG PO TABS: 5.0000 mg | ORAL_TABLET | Freq: Every day | ORAL | 3 refills | Status: DC

## 2020-02-17 ENCOUNTER — Telehealth: Payer: Self-pay | Admitting: Family Medicine

## 2020-02-17 NOTE — Telephone Encounter (Signed)
Dr Dettinger (coverage for [facility])   Please review last [facility] note and see what else we may can do.

## 2020-02-17 NOTE — Telephone Encounter (Signed)
That is a hard thing to gauge without actually seeing the rash or being able to see what to do.  I would continue taking the famotidine like he said and continue taking an antihistamine such as Claritin or Benadryl, if the rash persists the next step may be that she is to go to her dermatologist.

## 2020-02-17 NOTE — Telephone Encounter (Signed)
Patient aware and verbalizes understanding. 

## 2020-02-20 ENCOUNTER — Telehealth: Payer: Self-pay | Admitting: Family Medicine

## 2020-02-20 NOTE — Telephone Encounter (Signed)
Appt made

## 2020-02-21 ENCOUNTER — Other Ambulatory Visit: Payer: Self-pay

## 2020-02-21 ENCOUNTER — Encounter: Payer: Self-pay | Admitting: Family Medicine

## 2020-02-21 ENCOUNTER — Ambulatory Visit: Payer: Medicare Other | Admitting: Family Medicine

## 2020-02-21 VITALS — BP 132/74 | HR 68 | Temp 97.8°F | Resp 20 | Ht 64.5 in | Wt 165.0 lb

## 2020-02-21 DIAGNOSIS — L282 Other prurigo: Secondary | ICD-10-CM

## 2020-02-21 MED ORDER — PREDNISONE 10 MG PO TABS: ORAL_TABLET | ORAL | 0 refills | Status: DC

## 2020-02-21 MED ORDER — AMOXICILLIN-POT CLAVULANATE 875-125 MG PO TABS: 1.0000 | ORAL_TABLET | Freq: Two times a day (BID) | ORAL | 0 refills | Status: DC

## 2020-02-21 MED ORDER — BETAMETHASONE SOD PHOS & ACET 6 (3-3) MG/ML IJ SUSP
6.0000 mg | Freq: Once | INTRAMUSCULAR | Status: AC
  Administered 2020-02-21: 6 mg via INTRAMUSCULAR

## 2020-02-21 NOTE — Progress Notes (Signed)
Chief Complaint  Patient presents with   Itchy rash    HPI  Patient presents today for intensely pruritic rash.  It has not eased up since she was here before.  It is not associated with any systemic symptoms otherwise such as fever chills sweats upper respiratory changes cough etc.  She merely has erythematous papules that sometimes get a whitehead on them and are highly pruritic so she can help to scratch them.  PMH: Smoking status noted ROS: Per HPI  Objective: BP 132/74    Pulse 68    Temp 97.8 F (36.6 C) (Temporal)    Resp 20    Ht 5' 4.5" (1.638 m)    Wt 165 lb (74.8 kg)    SpO2 96%    BMI 27.88 kg/m  Gen: NAD, alert, cooperative with exam HEENT: NCAT, EOMI, PERRL CV: RRR, good S1/S2, no murmur Resp: CTABL, no wheezes, non-labored Skin: There are scattered papules that have been excoriated scattered over the abdomen back and extremities.  There are some morbilliform raised hive-like areas on the chest and shoulders and upper back. Neuro: Alert and oriented, No gross deficits  Assessment and plan:  1. Pruritic rash     Meds ordered this encounter  Medications   DISCONTD: amoxicillin-clavulanate (AUGMENTIN) 875-125 MG tablet    Sig: Take 1 tablet by mouth 2 (two) times daily. Take all of this medication    Dispense:  20 tablet    Refill:  0   DISCONTD: predniSONE (DELTASONE) 10 MG tablet    Sig: Take 5 daily for 3 days followed by 4,3,2 and 1 for 3 days each.    Dispense:  45 tablet    Refill:  0   betamethasone acetate-betamethasone sodium phosphate (CELESTONE) injection 6 mg   predniSONE (DELTASONE) 10 MG tablet    Sig: Take 5 daily for 3 days followed by 4,3,2 and 1 for 3 days each.    Dispense:  45 tablet    Refill:  0   amoxicillin-clavulanate (AUGMENTIN) 875-125 MG tablet    Sig: Take 1 tablet by mouth 2 (two) times daily. Take all of this medication    Dispense:  20 tablet    Refill:  0    Orders Placed This Encounter  Procedures   CBC with  Differential/Platelet   CMP14+EGFR    Order Specific Question:   Has the patient fasted?    Answer:   Yes   Sedimentation rate   RPR    Follow up as needed.  Claretta Fraise, MD

## 2020-02-21 NOTE — Patient Instructions (Signed)
I would like to place in temporary hold on your medications.  Keep in mind that allergic reactions do not have to happen just when a prescription is new.  They can happen at any time.  As a result I would like for you to place a 42-week old on all of your current medications with the exceptions of the clonazepam, the famotidine, the Zyrtec and the ones prescribed today-the prednisone and Augmentin.

## 2020-03-01 ENCOUNTER — Other Ambulatory Visit: Payer: Self-pay | Admitting: Family

## 2020-03-01 DIAGNOSIS — Z1231 Encounter for screening mammogram for malignant neoplasm of breast: Secondary | ICD-10-CM

## 2020-03-06 ENCOUNTER — Telehealth: Payer: Self-pay | Admitting: Family Medicine

## 2020-03-06 ENCOUNTER — Other Ambulatory Visit: Payer: Self-pay | Admitting: Family Medicine

## 2020-03-06 DIAGNOSIS — L282 Other prurigo: Secondary | ICD-10-CM

## 2020-03-06 NOTE — Telephone Encounter (Signed)
Left message on home voicemail. Informed pt that she should receive a phone call from Dr. Orvan Falconer office to schedule.

## 2020-03-06 NOTE — Telephone Encounter (Signed)
I sent in the referral

## 2020-03-06 NOTE — Telephone Encounter (Signed)
REFERRAL REQUEST Telephone Note  Have you been seen at our office for this problem?yes (Advise that they may need an appointment with their PCP before a referral can be done)  Reason for Referral: rash Referral discussed with patient: yes Best contact number of patient for referral team:   [phone] Has patient been seen by a specialist for this issue before: no Patient provider preference for referral: dr.Beaver  Patient location preference for referral: [place]   Patient notified that referrals can take up to a week or longer to process. If they haven't heard anything within a week they should call back and speak with the referral department.

## 2020-03-08 ENCOUNTER — Ambulatory Visit: Payer: Medicare Other | Admitting: Family Medicine

## 2020-04-04 ENCOUNTER — Other Ambulatory Visit: Payer: Self-pay

## 2020-04-04 ENCOUNTER — Ambulatory Visit (INDEPENDENT_AMBULATORY_CARE_PROVIDER_SITE_OTHER): Payer: Medicare Other

## 2020-04-04 DIAGNOSIS — Z23 Encounter for immunization: Secondary | ICD-10-CM

## 2020-04-18 ENCOUNTER — Other Ambulatory Visit: Payer: Self-pay | Admitting: Family Medicine

## 2020-04-18 DIAGNOSIS — G2581 Restless legs syndrome: Secondary | ICD-10-CM

## 2020-04-21 ENCOUNTER — Other Ambulatory Visit: Payer: Self-pay | Admitting: Family Medicine

## 2020-04-21 DIAGNOSIS — G2581 Restless legs syndrome: Secondary | ICD-10-CM

## 2020-05-04 ENCOUNTER — Other Ambulatory Visit: Payer: Self-pay | Admitting: Family Medicine

## 2020-05-04 DIAGNOSIS — L299 Pruritus, unspecified: Secondary | ICD-10-CM

## 2020-05-04 DIAGNOSIS — R21 Rash and other nonspecific skin eruption: Secondary | ICD-10-CM

## 2020-05-04 DIAGNOSIS — L509 Urticaria, unspecified: Secondary | ICD-10-CM

## 2020-05-09 ENCOUNTER — Ambulatory Visit (INDEPENDENT_AMBULATORY_CARE_PROVIDER_SITE_OTHER): Payer: Medicare Other

## 2020-05-09 DIAGNOSIS — Z0000 Encounter for general adult medical examination without abnormal findings: Secondary | ICD-10-CM

## 2020-05-09 NOTE — Progress Notes (Signed)
MEDICARE ANNUAL WELLNESS VISIT  05/09/2020  Telephone Visit Disclaimer This Medicare AWV was conducted by telephone due to national recommendations for restrictions regarding the COVID-19 Pandemic (e.g. social distancing).  I verified, using two identifiers, that I am speaking with Stephanie Gonzalez or their authorized healthcare agent. I discussed the limitations, risks, security, and privacy concerns of performing an evaluation and management service by telephone and the potential availability of an in-person appointment in the future. The patient expressed understanding and agreed to proceed.  Location of Patient: Home Location of Provider (nurse):  [facility]  Subjective:    Stephanie Gonzalez is a 79 y.o. female patient of Stacks, Broadus John, MD who had a Medicare Annual Wellness Visit today via telephone. Remedy is Retired and lives with their spouse. She has three children, five grandchildren, and three great grandchildren.. She reports that she is socially active and does interact with friends/family regularly. She is minimally physically active and enjoys spending time with her family, cooking and canning in the summer.  Patient Care Team: Mechele Claude, MD as PCP - General (Family Medicine)  Advanced Directives 05/09/2020 04/27/2019 08/04/2017 12/25/2016 12/24/2016 07/01/2016 07/10/2014  Does Patient Have a Medical Advance Directive? No Yes No No No No No  Type of Advance Directive - Living will - - - - -  Does patient want to make changes to medical advance directive? - No - Patient declined - - - - -  Would patient like information on creating a medical advance directive? No - Patient declined - - No - Patient declined No - Patient declined - No - patient declined information    Hospital Utilization Over the Past 12 Months: # of hospitalizations or ER visits: 0 # of surgeries: 0  Review of Systems    Patient reports that her overall health is worse compared to last year.  History obtained from  chart review and the patient  Patient Reported Readings (BP, Pulse, CBG, Weight, etc) none  Pain Assessment Pain : No/denies pain     Current Medications & Allergies (verified) Allergies as of 05/09/2020      Reactions   Other    Oranges; juice or nuts    Tetracycline Rash, Other (See Comments)   Agitation      Medication List       Accurate as of May 09, 2020  1:34 PM. If you have any questions, ask your nurse or doctor.        STOP taking these medications   amoxicillin-clavulanate 875-125 MG tablet Commonly known as: AUGMENTIN   predniSONE 10 MG tablet Commonly known as: DELTASONE     TAKE these medications   amLODipine 5 MG tablet Commonly known as: NORVASC Take 1 tablet (5 mg total) by mouth daily.   atorvastatin 40 MG tablet Commonly known as: LIPITOR Take 1 tablet (40 mg total) by mouth daily.   b complex vitamins tablet Take 1 tablet by mouth daily.   cetirizine 10 MG tablet Commonly known as: ZYRTEC TAKE 1 TABLET BY MOUTH EVERYDAY AT BEDTIME   clonazePAM 1 MG tablet Commonly known as: KLONOPIN Take 1 tablet (1 mg total) by mouth at bedtime. Needs to be seen in the every 6 months for controlled medications   famotidine 20 MG tablet Commonly known as: Pepcid Take 1 tablet (20 mg total) by mouth 2 (two) times daily.   freestyle lancets USE TO CHECK BLOOD SUGAR DAILY AND PRN   glucose blood test strip Commonly known as:  FREESTYLE LITE USE TO CHECK BLOOD SUGAR DAILY AND PRN   HYDROcodone-acetaminophen 10-325 MG tablet Commonly known as: NORCO Take 0.5 tablets by mouth 2 (two) times daily as needed for moderate pain. What changed: when to take this   LYSINE PO Take 1 tablet by mouth daily as needed (for cold sores).   metFORMIN 500 MG tablet Commonly known as: GLUCOPHAGE Take 1 tablet (500 mg total) by mouth daily with breakfast.   ProAir HFA 108 (90 Base) MCG/ACT inhaler Generic drug: albuterol Inhale 2 puffs into the lungs every 6  (six) hours as needed for wheezing.   sertraline 50 MG tablet Commonly known as: Zoloft Take 0.5-1 tablets (25-50 mg total) by mouth daily.       History (reviewed): Past Medical History:  Diagnosis Date  . Allergy   . Arthritis   . ASTHMA 10/06/2007  . Bronchial asthma   . BURSITIS, RIGHT HIP 02/04/2010  . DIABETES MELLITUS, TYPE II 11/01/2009  . Family history of adverse reaction to anesthesia    Mother had n/v  . H/O vertigo   . History of kidney stones    passed on her own  . HYPERLIPIDEMIA 10/06/2007  . HYPERTENSION 10/06/2007  . Pneumonia   . Restless leg syndrome    Past Surgical History:  Procedure Laterality Date  . ABDOMINAL HYSTERECTOMY    . APPENDECTOMY    . CARDIAC CATHETERIZATION  1992   ? 90's  . CHOLECYSTECTOMY    . COLONOSCOPY    . KNEE ARTHROSCOPY Right   . SPINAL CORD STIMULATOR INSERTION N/A 07/13/2014   Procedure: LUMBAR SPINAL CORD STIMULATOR INSERTION;  Surgeon: Venita Lick, MD;  Location: [facility] OR;  Service: Orthopedics;  Laterality: N/A;  . SPINE SURGERY    . TONSILLECTOMY    . TUBAL LIGATION     Family History  Problem Relation Age of Onset  . Alzheimer's disease Mother   . Cancer Father   . Alcoholism Father   . Breast cancer Neg Hx    Social History   Socioeconomic History  . Marital status: Married    Spouse name: Fayrene Fearing  . Number of children: 3  . Years of education: 49  . Highest education level: Some college, no degree  Occupational History    Employer: RETIRED  Tobacco Use  . Smoking status: Never Smoker  . Smokeless tobacco: Never Used  Vaping Use  . Vaping Use: Never used  Substance and Sexual Activity  . Alcohol use: No  . Drug use: No  . Sexual activity: Not Currently    Birth control/protection: Surgical  Other Topics Concern  . Not on file  Social History Narrative  . Not on file   Social Determinants of Health   Financial Resource Strain:   . Difficulty of Paying Living Expenses: Not on file  Food  Insecurity:   . Worried About Programme researcher, broadcasting/film/video in the Last Year: Not on file  . Ran Out of Food in the Last Year: Not on file  Transportation Needs:   . Lack of Transportation (Medical): Not on file  . Lack of Transportation (Non-Medical): Not on file  Physical Activity:   . Days of Exercise per Week: Not on file  . Minutes of Exercise per Session: Not on file  Stress:   . Feeling of Stress : Not on file  Social Connections:   . Frequency of Communication with Friends and Family: Not on file  . Frequency of Social Gatherings with Friends and Family:  Not on file  . Attends Religious Services: Not on file  . Active Member of Clubs or Organizations: Not on file  . Attends Banker Meetings: Not on file  . Marital Status: Not on file    Activities of Daily Living In your present state of health, do you have any difficulty performing the following activities: 05/09/2020  Hearing? N  Vision? N  Difficulty concentrating or making decisions? N  Walking or climbing stairs? N  Dressing or bathing? N  Doing errands, shopping? N  Preparing Food and eating ? N  Using the Toilet? N  In the past six months, have you accidently leaked urine? N  Do you have problems with loss of bowel control? N  Managing your Medications? N  Managing your Finances? N  Housekeeping or managing your Housekeeping? N  Some recent data might be hidden    Patient Education/ Literacy How often do you need to have someone help you when you read instructions, pamphlets, or other written materials from your doctor or pharmacy?: 1 - Never What is the last grade level you completed in school?: 2 years college  Exercise Current Exercise Habits: The patient does not participate in regular exercise at present, Exercise limited by: orthopedic condition(s)  Diet Patient reports consuming 2 meals a day and 1 snack(s) a day Patient reports that her primary diet is: Regular Patient reports that she does  have regular access to food.   Depression Screen PHQ 2/9 Scores 05/09/2020 02/21/2020 02/08/2020 10/13/2019 04/27/2019 04/18/2019 08/09/2018  PHQ - 2 Score 0 0 0 0 0 3 0  PHQ- 9 Score - - - - - 8 -     Fall Risk Fall Risk  05/09/2020 02/21/2020 02/08/2020 10/13/2019 04/27/2019  Falls in the past year? 0 0 0 0 0  Number falls in past yr: - - - 0 -  Injury with Fall? - - - 0 -  Risk for fall due to : - - - No Fall Risks -  Follow up Falls evaluation completed Falls evaluation completed Falls evaluation completed Falls evaluation completed -     Objective:  VERITY GILCREST seemed alert and oriented and she participated appropriately during our telephone visit.  Blood Pressure Weight BMI  BP Readings from Last 3 Encounters:  02/21/20 132/74  02/08/20 129/65  10/13/19 130/81   Wt Readings from Last 3 Encounters:  02/21/20 165 lb (74.8 kg)  02/08/20 168 lb (76.2 kg)  10/13/19 174 lb 3.2 oz (79 kg)   BMI Readings from Last 1 Encounters:  02/21/20 27.88 kg/m    *Unable to obtain current vital signs, weight, and BMI due to telephone visit type  Hearing/Vision  . Hilliary did not seem to have difficulty with hearing/understanding during the telephone conversation . Reports that she has had a formal eye exam by an eye care professional within the past year . Reports that she has not had a formal hearing evaluation within the past year *Unable to fully assess hearing and vision during telephone visit type  Cognitive Function: 6CIT Screen 05/09/2020 04/27/2019  What Year? 0 points 0 points  What month? 0 points 0 points  What time? 0 points 0 points  Count back from 20 0 points 0 points  Months in reverse 0 points 0 points  Repeat phrase 0 points 0 points  Total Score 0 0   (Normal:0-7, Significant for Dysfunction: >8)  Normal Cognitive Function Screening: Yes   Immunization & Health Maintenance Record Immunization  History  Administered Date(s) Administered  . Fluad Quad(high Dose 65+)  04/07/2019, 04/04/2020  . Influenza Split 04/16/2012  . Influenza Whole 03/24/2007, 03/09/2008, 03/06/2009, 02/04/2010, 02/18/2011  . Influenza, High Dose Seasonal PF 03/26/2016, 03/24/2017, 03/23/2018  . Influenza,inj,Quad PF,6+ Mos 04/04/2013, 04/10/2014, 03/09/2015  . Influenza,inj,quad, With Preservative 03/09/2017  . Pneumococcal Conjugate-13 06/13/2014  . Pneumococcal Polysaccharide-23 04/02/2010  . Td 04/02/2010  . Zoster 02/18/2011    Health Maintenance  Topic Date Due  . FOOT EXAM  08/04/2018  . OPHTHALMOLOGY EXAM  10/29/2018  . TETANUS/TDAP  04/02/2020  . URINE MICROALBUMIN  04/17/2020  . HEMOGLOBIN A1C  08/07/2020  . INFLUENZA VACCINE  Completed  . DEXA SCAN  Completed  . Hepatitis C Screening  Completed  . PNA vac Low Risk Adult  Completed  . COVID-19 Vaccine  Discontinued       Assessment  This is a routine wellness examination for NICOLLETTE WILHELMI.  Health Maintenance: Due or Overdue Health Maintenance Due  Topic Date Due  . FOOT EXAM  08/04/2018  . OPHTHALMOLOGY EXAM  10/29/2018  . TETANUS/TDAP  04/02/2020  . URINE MICROALBUMIN  04/17/2020    Stephanie Gonzalez does not need a referral for Community Assistance: Care Management:   no Social Work:    no Prescription Assistance:  no Nutrition/Diabetes Education:  no   Plan:  Personalized Goals Goals Addressed            This Visit's Progress   . Patient Stated       05/09/2020 AWV Goal: Exercise for General Health   Patient will verbalize understanding of the benefits of increased physical activity:  Exercising regularly is important. It will improve your overall fitness, flexibility, and endurance.  Regular exercise also will improve your overall health. It can help you control your weight, reduce stress, and improve your bone density.  Over the next year, patient will increase physical activity as tolerated with a goal of at least 150 minutes of moderate physical activity per week.   You can tell  that you are exercising at a moderate intensity if your heart starts beating faster and you start breathing faster but can still hold a conversation.  Moderate-intensity exercise ideas include:  Walking 1 mile (1.6 km) in about 15 minutes  Biking  Hiking  Golfing  Dancing  Water aerobics  Patient will verbalize understanding of everyday activities that increase physical activity by providing examples like the following: ? Yard work, such as: ? Pushing a Surveyor, mining ? Raking and bagging leaves ? Washing your car ? Pushing a stroller ? Shoveling snow ? Gardening ? Washing windows or floors  Patient will be able to explain general safety guidelines for exercising:   Before you start a new exercise program, talk with your health care provider.  Do not exercise so much that you hurt yourself, feel dizzy, or get very short of breath.  Wear comfortable clothes and wear shoes with good support.  Drink plenty of water while you exercise to prevent dehydration or heat stroke.  Work out until your breathing and your heartbeat get faster.       Personalized Health Maintenance & Screening Recommendations  Td vaccine Bone densitometry screening  Lung Cancer Screening Recommended: no (Low Dose CT Chest recommended if Age 39-80 years, 30 pack-year currently smoking OR have quit w/in past 15 years) Hepatitis C Screening recommended: no HIV Screening recommended: no  Advanced Directives: Written information was not prepared per patient's request.  Referrals &  Orders No orders of the defined types were placed in this encounter.   Follow-up Plan . Follow-up with Mechele ClaudeStacks, [place], MD as planned . Schedule bone density screening    I have personally reviewed and noted the following in the patient's chart:   . Medical and social history . Use of alcohol, tobacco or illicit drugs  . Current medications and supplements . Functional ability and status . Nutritional  status . Physical activity . Advanced directives . List of other physicians . Hospitalizations, surgeries, and ER visits in previous 12 months . Vitals . Screenings to include cognitive, depression, and falls . Referrals and appointments  In addition, I have reviewed and discussed with Stephanie RamusSue M Winch certain preventive protocols, quality metrics, and best practice recommendations. A written personalized care plan for preventive services as well as general preventive health recommendations is available and can be mailed to the patient at her request.      Mariam DollarJan Malaak Stach, LPN    [id number]/06/2019

## 2020-07-30 ENCOUNTER — Other Ambulatory Visit: Payer: Self-pay | Admitting: Family Medicine

## 2020-07-30 DIAGNOSIS — Z1231 Encounter for screening mammogram for malignant neoplasm of breast: Secondary | ICD-10-CM

## 2020-08-07 ENCOUNTER — Other Ambulatory Visit: Payer: Self-pay

## 2020-08-07 ENCOUNTER — Encounter: Payer: Self-pay | Admitting: Family Medicine

## 2020-08-07 ENCOUNTER — Ambulatory Visit: Payer: Medicare Other | Admitting: Family Medicine

## 2020-08-07 VITALS — BP 132/83 | HR 71 | Temp 97.8°F | Resp 20 | Ht 64.5 in | Wt 167.0 lb

## 2020-08-07 DIAGNOSIS — E119 Type 2 diabetes mellitus without complications: Secondary | ICD-10-CM

## 2020-08-07 DIAGNOSIS — G2581 Restless legs syndrome: Secondary | ICD-10-CM | POA: Diagnosis not present

## 2020-08-07 DIAGNOSIS — E785 Hyperlipidemia, unspecified: Secondary | ICD-10-CM

## 2020-08-07 DIAGNOSIS — L299 Pruritus, unspecified: Secondary | ICD-10-CM

## 2020-08-07 DIAGNOSIS — I10 Essential (primary) hypertension: Secondary | ICD-10-CM

## 2020-08-07 DIAGNOSIS — F339 Major depressive disorder, recurrent, unspecified: Secondary | ICD-10-CM

## 2020-08-07 DIAGNOSIS — L509 Urticaria, unspecified: Secondary | ICD-10-CM

## 2020-08-07 DIAGNOSIS — R21 Rash and other nonspecific skin eruption: Secondary | ICD-10-CM

## 2020-08-07 LAB — BAYER DCA HB A1C WAIVED: HB A1C (BAYER DCA - WAIVED): 5.1 % (ref <7.0)

## 2020-08-07 MED ORDER — FREESTYLE LANCETS MISC
12 refills | Status: DC
Start: 2020-08-07 — End: 2020-08-10

## 2020-08-07 MED ORDER — SERTRALINE HCL 50 MG PO TABS: 25.0000 mg | ORAL_TABLET | Freq: Every day | ORAL | 1 refills | Status: DC

## 2020-08-07 MED ORDER — CLONAZEPAM 1 MG PO TABS: 1.0000 mg | ORAL_TABLET | Freq: Every day | ORAL | 1 refills | Status: DC

## 2020-08-07 MED ORDER — FAMOTIDINE 20 MG PO TABS: 20.0000 mg | ORAL_TABLET | Freq: Two times a day (BID) | ORAL | 1 refills | Status: DC

## 2020-08-07 NOTE — Progress Notes (Signed)
Subjective:  Patient ID: Stephanie Gonzalez,  female    DOB: 1940/07/05  Age: 80 y.o.    CC: Medical Management of Chronic Issues   HPI Stephanie Gonzalez presents for  follow-up of hypertension. Patient has no history of headache chest pain or shortness of breath or recent cough. Patient also denies symptoms of TIA such as numbness weakness lateralizing. Patient denies side effects from medication. States taking it regularly.  Patient also  in for follow-up of elevated cholesterol. Doing well without complaints on current medication. Denies side effects  including myalgia and arthralgia and nausea. Also in today for liver function testing. Currently no chest pain, shortness of breath or other cardiovascular related symptoms noted.  Follow-up of diabetes. Patient does check blood sugar at home.. Not every day. Denies highs or lows. Usually aaround 100. Patient denies symptoms such as excessive hunger or urinary frequency, excessive hunger, nausea No significant hypoglycemic spells noted. Medications reviewed. Pt reports taking them regularly. Pt. denies complication/adverse reaction today.   Takes clonazepam at around 10 pm to calm her restless legs. Works well.    History Stephanie Gonzalez has a past medical history of Allergy, Arthritis, ASTHMA (10/06/2007), Bronchial asthma, BURSITIS, RIGHT HIP (02/04/2010), DIABETES MELLITUS, TYPE II (11/01/2009), Family history of adverse reaction to anesthesia, H/O vertigo, History of kidney stones, HYPERLIPIDEMIA (10/06/2007), HYPERTENSION (10/06/2007), Pneumonia, and Restless leg syndrome.   She has a past surgical history that includes Appendectomy; Cholecystectomy; Abdominal hysterectomy; Tubal ligation; Tonsillectomy; Knee arthroscopy (Right); Spinal cord stimulator insertion (N/A, 07/13/2014); Colonoscopy; Cardiac catheterization (1992); and Spine surgery.   Her family history includes Alcoholism in her father; Alzheimer's disease in her mother; Cancer in her father.She  reports that she has never smoked. She has never used smokeless tobacco. She reports that she does not drink alcohol and does not use drugs.  Current Outpatient Medications on File Prior to Visit  Medication Sig Dispense Refill  . albuterol (VENTOLIN HFA) 108 (90 Base) MCG/ACT inhaler Inhale 2 puffs into the lungs every 6 (six) hours as needed for wheezing.    Marland Kitchen amLODipine (NORVASC) 5 MG tablet Take 1 tablet (5 mg total) by mouth daily. 90 tablet 3  . atorvastatin (LIPITOR) 40 MG tablet Take 1 tablet (40 mg total) by mouth daily. 90 tablet 3  . b complex vitamins tablet Take 1 tablet by mouth daily.    . cetirizine (ZYRTEC) 10 MG tablet TAKE 1 TABLET BY MOUTH EVERYDAY AT BEDTIME 90 tablet 3  . glucose blood (FREESTYLE LITE) test strip USE TO CHECK BLOOD SUGAR DAILY AND PRN 100 each 12  . HYDROcodone-acetaminophen (NORCO) 10-325 MG tablet Take 0.5 tablets by mouth 2 (two) times daily as needed for moderate pain. (Patient taking differently: Take 0.5 tablets by mouth every 6 (six) hours as needed for moderate pain.) 30 tablet 0  . LYSINE PO Take 1 tablet by mouth daily as needed (for cold sores).     . metFORMIN (GLUCOPHAGE) 500 MG tablet Take 1 tablet (500 mg total) by mouth daily with breakfast. 90 tablet 3   No current facility-administered medications on file prior to visit.    ROS Review of Systems  Constitutional: Negative.   HENT: Negative.   Eyes: Negative for visual disturbance.  Respiratory: Negative for shortness of breath.   Cardiovascular: Negative for chest pain.  Gastrointestinal: Negative for abdominal pain.  Musculoskeletal: Positive for arthralgias and back pain (controlled with hydrocodone).  Psychiatric/Behavioral: The patient is nervous/anxious.     Objective:  BP  132/83   Pulse 71   Temp 97.8 F (36.6 C) (Temporal)   Resp 20   Ht 5' 4.5" (1.638 m)   Wt 167 lb (75.8 kg)   SpO2 98%   BMI 28.22 kg/m   BP Readings from Last 3 Encounters:  08/07/20 132/83   02/21/20 132/74  02/08/20 129/65    Wt Readings from Last 3 Encounters:  08/07/20 167 lb (75.8 kg)  02/21/20 165 lb (74.8 kg)  02/08/20 168 lb (76.2 kg)     Physical Exam Constitutional:      General: She is not in acute distress.    Appearance: She is well-developed and well-nourished.  Cardiovascular:     Rate and Rhythm: Normal rate and regular rhythm.  Pulmonary:     Breath sounds: Normal breath sounds.  Skin:    General: Skin is warm and dry.  Neurological:     Mental Status: She is alert and oriented to person, place, and time.  Psychiatric:        Mood and Affect: Mood and affect normal.     Diabetic Foot Exam - Simple   Simple Foot Form Diabetic Foot exam was performed with the following findings: Yes 08/07/2020  3:44 PM  Visual Inspection No deformities, no ulcerations, no other skin breakdown bilaterally: Yes Sensation Testing Intact to touch and monofilament testing bilaterally: Yes Pulse Check Posterior Tibialis and Dorsalis pulse intact bilaterally: Yes Comments       Assessment & Plan:   Stephanie Gonzalez was seen today for medical management of chronic issues.  Diagnoses and all orders for this visit:  Diabetes mellitus without complication ([place]) -     Microalbumin / creatinine urine ratio -     Bayer DCA Hb A1c Waived -     CBC with Differential/Platelet -     CMP14+EGFR -     Lipid panel  Dyslipidemia -     CBC with Differential/Platelet -     CMP14+EGFR -     Lipid panel  Essential hypertension -     CBC with Differential/Platelet -     CMP14+EGFR -     Lipid panel  Restless legs syndrome (RLS) -     clonazePAM (KLONOPIN) 1 MG tablet; Take 1 tablet (1 mg total) by mouth at bedtime.  Rash of entire body -     famotidine (PEPCID) 20 MG tablet; Take 1 tablet (20 mg total) by mouth 2 (two) times daily.  Severe itching -     famotidine (PEPCID) 20 MG tablet; Take 1 tablet (20 mg total) by mouth 2 (two) times daily.  Hives -     famotidine  (PEPCID) 20 MG tablet; Take 1 tablet (20 mg total) by mouth 2 (two) times daily.  Depression, recurrent ([place]) -     sertraline (ZOLOFT) 50 MG tablet; Take 0.5-1 tablets (25-50 mg total) by mouth daily.  Other orders -     Lancets (FREESTYLE) lancets; USE TO CHECK BLOOD SUGAR DAILY AND PRN   I have changed Stephanie Gonzalez. Stephanie Gonzalez's clonazePAM. I am also having her maintain her albuterol, glucose blood, LYSINE PO, b complex vitamins, HYDROcodone-acetaminophen, metFORMIN, atorvastatin, amLODipine, cetirizine, famotidine, freestyle, and sertraline.  Meds ordered this encounter  Medications  . clonazePAM (KLONOPIN) 1 MG tablet    Sig: Take 1 tablet (1 mg total) by mouth at bedtime.    Dispense:  90 tablet    Refill:  1    This request is for a new prescription for a controlled substance  as required by [employer].  . famotidine (PEPCID) 20 MG tablet    Sig: Take 1 tablet (20 mg total) by mouth 2 (two) times daily.    Dispense:  180 tablet    Refill:  1  . Lancets (FREESTYLE) lancets    Sig: USE TO CHECK BLOOD SUGAR DAILY AND PRN    Dispense:  100 each    Refill:  12    Dx: E11.9  . sertraline (ZOLOFT) 50 MG tablet    Sig: Take 0.5-1 tablets (25-50 mg total) by mouth daily.    Dispense:  90 tablet    Refill:  1     Follow-up: Return in about 6 months (around 02/07/2021).  Claretta Fraise, M.D.

## 2020-08-08 ENCOUNTER — Other Ambulatory Visit: Payer: Medicare Other

## 2020-08-08 LAB — CMP14+EGFR
ALT: 8 IU/L (ref 0–32)
AST: 15 IU/L (ref 0–40)
Albumin/Globulin Ratio: 1.7 (ref 1.2–2.2)
Albumin: 4 g/dL (ref 3.7–4.7)
Alkaline Phosphatase: 115 IU/L (ref 44–121)
BUN/Creatinine Ratio: 31 — ABNORMAL HIGH (ref 12–28)
BUN: 18 mg/dL (ref 8–27)
Bilirubin Total: 0.5 mg/dL (ref 0.0–1.2)
CO2: 23 mmol/L (ref 20–29)
Calcium: 9.5 mg/dL (ref 8.7–10.3)
Chloride: 102 mmol/L (ref 96–106)
Creatinine, Ser: 0.58 mg/dL (ref 0.57–1.00)
Globulin, Total: 2.4 g/dL (ref 1.5–4.5)
Glucose: 107 mg/dL — ABNORMAL HIGH (ref 65–99)
Potassium: 4.2 mmol/L (ref 3.5–5.2)
Sodium: 142 mmol/L (ref 134–144)
Total Protein: 6.4 g/dL (ref 6.0–8.5)
eGFR: 93 mL/min/{1.73_m2} (ref >59)

## 2020-08-08 LAB — CBC WITH DIFFERENTIAL/PLATELET
Basophils Absolute: 0.1 10*3/uL (ref 0.0–0.2)
Basos: 1 %
EOS (ABSOLUTE): 0.2 10*3/uL (ref 0.0–0.4)
Eos: 2 %
Hematocrit: 38.9 % (ref 34.0–46.6)
Hemoglobin: 13.1 g/dL (ref 11.1–15.9)
Immature Grans (Abs): 0 10*3/uL (ref 0.0–0.1)
Immature Granulocytes: 0 %
Lymphocytes Absolute: 3.5 10*3/uL — ABNORMAL HIGH (ref 0.7–3.1)
Lymphs: 43 %
MCH: 28.3 pg (ref 26.6–33.0)
MCHC: 33.7 g/dL (ref 31.5–35.7)
MCV: 84 fL (ref 79–97)
Monocytes Absolute: 0.5 10*3/uL (ref 0.1–0.9)
Monocytes: 6 %
Neutrophils Absolute: 4 10*3/uL (ref 1.4–7.0)
Neutrophils: 48 %
Platelets: 222 10*3/uL (ref 150–450)
RBC: 4.63 x10E6/uL (ref 3.77–5.28)
RDW: 13.2 % (ref 11.7–15.4)
WBC: 8.3 10*3/uL (ref 3.4–10.8)

## 2020-08-08 LAB — LIPID PANEL
Chol/HDL Ratio: 5.2 ratio — ABNORMAL HIGH (ref 0.0–4.4)
Cholesterol, Total: 207 mg/dL — ABNORMAL HIGH (ref 100–199)
HDL: 40 mg/dL (ref >39)
LDL Chol Calc (NIH): 98 mg/dL (ref 0–99)
Triglycerides: 413 mg/dL — ABNORMAL HIGH (ref 0–149)
VLDL Cholesterol Cal: 69 mg/dL — ABNORMAL HIGH (ref 5–40)

## 2020-08-09 LAB — MICROALBUMIN / CREATININE URINE RATIO
Creatinine, Urine: 146.7 mg/dL
Microalb/Creat Ratio: 12 mg/g creat (ref 0–29)
Microalbumin, Urine: 18 ug/mL

## 2020-08-10 ENCOUNTER — Telehealth: Payer: Self-pay

## 2020-08-10 ENCOUNTER — Other Ambulatory Visit: Payer: Self-pay | Admitting: Family Medicine

## 2020-08-10 DIAGNOSIS — L299 Pruritus, unspecified: Secondary | ICD-10-CM

## 2020-08-10 DIAGNOSIS — L509 Urticaria, unspecified: Secondary | ICD-10-CM

## 2020-08-10 DIAGNOSIS — R21 Rash and other nonspecific skin eruption: Secondary | ICD-10-CM

## 2020-08-10 DIAGNOSIS — F339 Major depressive disorder, recurrent, unspecified: Secondary | ICD-10-CM

## 2020-08-10 MED ORDER — FREESTYLE LANCETS MISC: 12 refills | Status: DC

## 2020-08-10 MED ORDER — SERTRALINE HCL 50 MG PO TABS: 25.0000 mg | ORAL_TABLET | Freq: Every day | ORAL | 1 refills | Status: DC

## 2020-08-10 MED ORDER — FAMOTIDINE 20 MG PO TABS: 20.0000 mg | ORAL_TABLET | Freq: Two times a day (BID) | ORAL | 3 refills | Status: DC

## 2020-08-10 MED ORDER — FENOFIBRATE 160 MG PO TABS: 160.0000 mg | ORAL_TABLET | Freq: Every day | ORAL | 1 refills | Status: DC

## 2020-08-10 MED ORDER — FENOFIBRATE 160 MG PO TABS
160.0000 mg | ORAL_TABLET | Freq: Every day | ORAL | 3 refills | Status: DC
Start: 2020-08-10 — End: 2020-08-10

## 2020-08-10 NOTE — Telephone Encounter (Signed)
Patient aware that Fenofibrate, Famotidine, Zoloft and lancets sent to mail order pharmacy.

## 2020-09-18 ENCOUNTER — Inpatient Hospital Stay: Admission: RE | Admit: 2020-09-18 | Payer: Medicare Other | Source: Ambulatory Visit

## 2020-10-10 ENCOUNTER — Other Ambulatory Visit: Payer: Self-pay | Admitting: Family Medicine

## 2020-10-10 DIAGNOSIS — G2581 Restless legs syndrome: Secondary | ICD-10-CM

## 2020-10-29 ENCOUNTER — Other Ambulatory Visit: Payer: Self-pay

## 2020-10-29 ENCOUNTER — Ambulatory Visit: Payer: Medicare Other | Admitting: Family Medicine

## 2020-10-29 ENCOUNTER — Encounter: Payer: Self-pay | Admitting: Family Medicine

## 2020-10-29 VITALS — BP 136/79 | HR 64 | Temp 97.9°F | Ht 64.5 in | Wt 166.0 lb

## 2020-10-29 DIAGNOSIS — H1011 Acute atopic conjunctivitis, right eye: Secondary | ICD-10-CM | POA: Diagnosis not present

## 2020-10-29 MED ORDER — BETAMETHASONE SOD PHOS & ACET 6 (3-3) MG/ML IJ SUSP
6.0000 mg | Freq: Once | INTRAMUSCULAR | Status: AC
  Administered 2020-10-29: 6 mg via INTRAMUSCULAR

## 2020-10-29 NOTE — Progress Notes (Signed)
Chief Complaint  Patient presents with  . [place]dereye swelling and redness    HPI  Patient presents today for intermittent redness and puffiness. Itching . Onset several weeks ago with putting flowers at graves. Got better with keflex and a shot. This time she was tending flowers in her garden and sx started the next day.   PMH: Smoking status noted ROS: Per HPI  Objective: BP 136/79   Pulse 64   Temp 97.9 F (36.6 C)   Ht 5' 4.5" (1.638 m)   Wt 166 lb (75.3 kg)   SpO2 98%   BMI 28.05 kg/m  Gen: NAD, alert, cooperative with exam HEENT: NCAT, EOMI, PERRL there is erythema and chemosis at the lower lids CV: RRR, good S1/S2, no murmur Resp: CTABL, no wheezes, non-labored Ext: No edema, warm Neuro: Alert and oriented, No gross deficits  Assessment and plan:  1. Allergic conjunctivitis of right eye     Meds ordered this encounter  Medications  . betamethasone acetate-betamethasone sodium phosphate (CELESTONE) injection 6 mg    Follow up as needed.  Mechele Claude, MD

## 2020-11-07 ENCOUNTER — Other Ambulatory Visit: Payer: Self-pay

## 2020-11-07 ENCOUNTER — Ambulatory Visit
Admission: RE | Admit: 2020-11-07 | Discharge: 2020-11-07 | Disposition: A | Discharge status: Home / Self Care | Payer: Medicare Other | Source: Ambulatory Visit | Attending: Family Medicine | Admitting: Family Medicine

## 2020-11-07 DIAGNOSIS — Z1231 Encounter for screening mammogram for malignant neoplasm of breast: Secondary | ICD-10-CM

## 2021-04-10 ENCOUNTER — Ambulatory Visit: Payer: Medicare Other

## 2021-04-11 ENCOUNTER — Ambulatory Visit (INDEPENDENT_AMBULATORY_CARE_PROVIDER_SITE_OTHER): Payer: Medicare Other | Admitting: *Deleted

## 2021-04-11 ENCOUNTER — Other Ambulatory Visit: Payer: Self-pay

## 2021-04-11 DIAGNOSIS — Z23 Encounter for immunization: Secondary | ICD-10-CM

## 2021-04-21 ENCOUNTER — Telehealth: Payer: Self-pay | Admitting: Family Medicine

## 2021-04-21 DIAGNOSIS — G2581 Restless legs syndrome: Secondary | ICD-10-CM

## 2021-04-22 NOTE — Telephone Encounter (Signed)
Pt aware when she made appt that refill will not be done until her appt on 04/24/21

## 2021-04-24 ENCOUNTER — Encounter: Payer: Self-pay | Admitting: Family Medicine

## 2021-04-24 ENCOUNTER — Other Ambulatory Visit: Payer: Self-pay

## 2021-04-24 ENCOUNTER — Ambulatory Visit: Payer: Medicare Other | Admitting: Family Medicine

## 2021-04-24 VITALS — BP 145/67 | HR 70 | Temp 97.8°F | Ht 64.5 in | Wt 170.6 lb

## 2021-04-24 DIAGNOSIS — G2581 Restless legs syndrome: Secondary | ICD-10-CM

## 2021-04-24 DIAGNOSIS — I10 Essential (primary) hypertension: Secondary | ICD-10-CM | POA: Diagnosis not present

## 2021-04-24 DIAGNOSIS — E119 Type 2 diabetes mellitus without complications: Secondary | ICD-10-CM

## 2021-04-24 DIAGNOSIS — F339 Major depressive disorder, recurrent, unspecified: Secondary | ICD-10-CM

## 2021-04-24 DIAGNOSIS — E785 Hyperlipidemia, unspecified: Secondary | ICD-10-CM | POA: Diagnosis not present

## 2021-04-24 LAB — BAYER DCA HB A1C WAIVED: HB A1C (BAYER DCA - WAIVED): 5.1 % (ref 4.8–5.6)

## 2021-04-24 MED ORDER — SERTRALINE HCL 50 MG PO TABS: 25.0000 mg | ORAL_TABLET | Freq: Every day | ORAL | 2 refills | Status: DC

## 2021-04-24 MED ORDER — METFORMIN HCL 500 MG PO TABS
500.0000 mg | ORAL_TABLET | Freq: Every day | ORAL | 3 refills | Status: DC
Start: 2021-04-24 — End: 2021-10-22

## 2021-04-24 MED ORDER — ATORVASTATIN CALCIUM 40 MG PO TABS: 40.0000 mg | ORAL_TABLET | Freq: Every day | ORAL | 3 refills | Status: DC

## 2021-04-24 MED ORDER — CLONAZEPAM 1 MG PO TABS: 1.0000 mg | ORAL_TABLET | Freq: Every day | ORAL | 1 refills | Status: DC

## 2021-04-24 MED ORDER — AMLODIPINE BESYLATE 5 MG PO TABS: 5.0000 mg | ORAL_TABLET | Freq: Every day | ORAL | 3 refills | Status: DC

## 2021-04-24 NOTE — Progress Notes (Signed)
Subjective:  Patient ID: Stephanie Gonzalez, female    DOB: [birth year]  Age: 80 y.o. MRN: [id number]  CC: Medical Management of Chronic Issues   HPI Stephanie Gonzalez presents forFollow-up of diabetes. Patient checks blood sugar at home.   110 fasting. Patient denies symptoms such as polyuria, polydipsia, excessive hunger, nausea No significant hypoglycemic spells noted. Occasionally gets shaky and has to eat something. LEss than once a month.   Medications reviewed. Pt reports taking them regularly without complication/adverse reaction being reported today.   Restless legs responds wonderfully to the medication she is taking at bedtime.    She presents for  follow-up of hypertension. Patient has no history of headache chest pain or shortness of breath or recent cough. Patient also denies symptoms of TIA such as focal numbness or weakness. Patient denies side effects from medication. States taking it regularly.  BP up due to missing dose of med, getting up early Today and funeral for son in law last weekend   History Stephanie Gonzalez has a past medical history of Allergy, Arthritis, ASTHMA (10/06/2007), Bronchial asthma, BURSITIS, RIGHT HIP (02/04/2010), DIABETES MELLITUS, TYPE II (11/01/2009), Family history of adverse reaction to anesthesia, H/O vertigo, History of kidney stones, HYPERLIPIDEMIA (10/06/2007), HYPERTENSION (10/06/2007), Pneumonia, and Restless leg syndrome.   She has a past surgical history that includes Appendectomy; Cholecystectomy; Abdominal hysterectomy; Tubal ligation; Tonsillectomy; Knee arthroscopy (Right); Spinal cord stimulator insertion (N/A, 07/13/2014); Colonoscopy; Cardiac catheterization (1992); and Spine surgery.   Her family history includes Alcoholism in her father; Alzheimer's disease in her mother; Cancer in her father.She reports that she has never smoked. She has never used smokeless tobacco. She reports that she does not drink alcohol and does not use drugs.  Current Outpatient  Medications on File Prior to Visit  Medication Sig Dispense Refill   albuterol (VENTOLIN HFA) 108 (90 Base) MCG/ACT inhaler Inhale 2 puffs into the lungs every 6 (six) hours as needed for wheezing.     b complex vitamins tablet Take 1 tablet by mouth daily.     cetirizine (ZYRTEC) 10 MG tablet TAKE 1 TABLET BY MOUTH EVERYDAY AT BEDTIME 90 tablet 3   famotidine (PEPCID) 20 MG tablet Take 1 tablet (20 mg total) by mouth 2 (two) times daily. 180 tablet 3   fenofibrate 160 MG tablet Take 1 tablet (160 mg total) by mouth daily. For cholesterol and triglyceride 90 tablet 1   glucose blood (FREESTYLE LITE) test strip USE TO CHECK BLOOD SUGAR DAILY AND PRN 100 each 12   HYDROcodone-acetaminophen (NORCO) 10-325 MG tablet Take 0.5 tablets by mouth 2 (two) times daily as needed for moderate pain. (Patient taking differently: Take 0.5 tablets by mouth every 6 (six) hours as needed for moderate pain.) 30 tablet 0   Lancets (FREESTYLE) lancets USE TO CHECK BLOOD SUGAR DAILY AND PRN 100 each 12   LYSINE PO Take 1 tablet by mouth daily as needed (for cold sores).      No current facility-administered medications on file prior to visit.    ROS Review of Systems  Constitutional: Negative.   HENT: Negative.    Eyes:  Negative for visual disturbance.  Respiratory:  Negative for shortness of breath.   Cardiovascular:  Negative for chest pain.  Gastrointestinal:  Negative for abdominal pain.  Musculoskeletal:  Negative for arthralgias.   Objective:  BP (!) 145/67   Pulse 70   Temp 97.8 F (36.6 C)   Ht 5' 4.5" (1.638 m)   Wt 170 lb  9.6 oz (77.4 kg)   SpO2 96%   BMI 28.83 kg/m   BP Readings from Last 3 Encounters:  04/24/21 (!) 145/67  10/29/20 136/79  08/07/20 132/83    Wt Readings from Last 3 Encounters:  04/24/21 170 lb 9.6 oz (77.4 kg)  10/29/20 166 lb (75.3 kg)  08/07/20 167 lb (75.8 kg)     Physical Exam Constitutional:      General: She is not in acute distress.    Appearance: She  is well-developed.  Cardiovascular:     Rate and Rhythm: Normal rate and regular rhythm.  Pulmonary:     Breath sounds: Normal breath sounds.  Musculoskeletal:        General: Normal range of motion.  Skin:    General: Skin is warm and dry.  Neurological:     Mental Status: She is alert and oriented to person, place, and time.      Assessment & Plan:   Stephanie Gonzalez was seen today for medical management of chronic issues.  Diagnoses and all orders for this visit:  Diabetes mellitus without complication ([place]) -     Bayer DCA Hb A1c Waived -     CBC with Differential/Platelet -     CMP14+EGFR -     metFORMIN (GLUCOPHAGE) 500 MG tablet; Take 1 tablet (500 mg total) by mouth daily with breakfast.  Essential hypertension -     CMP14+EGFR -     amLODipine (NORVASC) 5 MG tablet; Take 1 tablet (5 mg total) by mouth daily.  Dyslipidemia -     Lipid panel -     atorvastatin (LIPITOR) 40 MG tablet; Take 1 tablet (40 mg total) by mouth daily.  Restless legs syndrome (RLS) -     clonazePAM (KLONOPIN) 1 MG tablet; Take 1 tablet (1 mg total) by mouth at bedtime.  Depression, recurrent ([place]) -     sertraline (ZOLOFT) 50 MG tablet; Take 0.5-1 tablets (25-50 mg total) by mouth daily.     I am having Stephanie Gonzalez maintain her albuterol, glucose blood, LYSINE PO, b complex vitamins, HYDROcodone-acetaminophen, cetirizine, famotidine, fenofibrate, freestyle, clonazePAM, amLODipine, atorvastatin, metFORMIN, and sertraline.  Meds ordered this encounter  Medications   clonazePAM (KLONOPIN) 1 MG tablet    Sig: Take 1 tablet (1 mg total) by mouth at bedtime.    Dispense:  90 tablet    Refill:  1    This request is for a new prescription for a controlled substance as required by Federal/State law.   amLODipine (NORVASC) 5 MG tablet    Sig: Take 1 tablet (5 mg total) by mouth daily.    Dispense:  90 tablet    Refill:  3   atorvastatin (LIPITOR) 40 MG tablet    Sig: Take 1 tablet (40 mg total) by  mouth daily.    Dispense:  90 tablet    Refill:  3   metFORMIN (GLUCOPHAGE) 500 MG tablet    Sig: Take 1 tablet (500 mg total) by mouth daily with breakfast.    Dispense:  90 tablet    Refill:  3   sertraline (ZOLOFT) 50 MG tablet    Sig: Take 0.5-1 tablets (25-50 mg total) by mouth daily.    Dispense:  90 tablet    Refill:  2     Follow-up: Return in about 3 months (around 07/25/2021).  Claretta Fraise, M.D.

## 2021-04-25 LAB — CMP14+EGFR
ALT: 9 IU/L (ref 0–32)
AST: 15 IU/L (ref 0–40)
Albumin/Globulin Ratio: 1.7 (ref 1.2–2.2)
Albumin: 4 g/dL (ref 3.7–4.7)
Alkaline Phosphatase: 97 IU/L (ref 44–121)
BUN/Creatinine Ratio: 20 (ref 12–28)
BUN: 15 mg/dL (ref 8–27)
Bilirubin Total: 0.6 mg/dL (ref 0.0–1.2)
CO2: 25 mmol/L (ref 20–29)
Calcium: 8.8 mg/dL (ref 8.7–10.3)
Chloride: 106 mmol/L (ref 96–106)
Creatinine, Ser: 0.76 mg/dL (ref 0.57–1.00)
Globulin, Total: 2.3 g/dL (ref 1.5–4.5)
Glucose: 94 mg/dL (ref 70–99)
Potassium: 3.9 mmol/L (ref 3.5–5.2)
Sodium: 143 mmol/L (ref 134–144)
Total Protein: 6.3 g/dL (ref 6.0–8.5)
eGFR: 80 mL/min/{1.73_m2} (ref >59)

## 2021-04-25 LAB — LIPID PANEL
Chol/HDL Ratio: 7.2 ratio — ABNORMAL HIGH (ref 0.0–4.4)
Cholesterol, Total: 222 mg/dL — ABNORMAL HIGH (ref 100–199)
HDL: 31 mg/dL — ABNORMAL LOW (ref >39)
LDL Chol Calc (NIH): 126 mg/dL — ABNORMAL HIGH (ref 0–99)
Triglycerides: 366 mg/dL — ABNORMAL HIGH (ref 0–149)
VLDL Cholesterol Cal: 65 mg/dL — ABNORMAL HIGH (ref 5–40)

## 2021-04-25 LAB — CBC WITH DIFFERENTIAL/PLATELET
Basophils Absolute: 0 10*3/uL (ref 0.0–0.2)
Basos: 1 %
EOS (ABSOLUTE): 0.3 10*3/uL (ref 0.0–0.4)
Eos: 4 %
Hematocrit: 38.2 % (ref 34.0–46.6)
Hemoglobin: 12.8 g/dL (ref 11.1–15.9)
Immature Grans (Abs): 0 10*3/uL (ref 0.0–0.1)
Immature Granulocytes: 0 %
Lymphocytes Absolute: 3.2 10*3/uL — ABNORMAL HIGH (ref 0.7–3.1)
Lymphs: 46 %
MCH: 28.4 pg (ref 26.6–33.0)
MCHC: 33.5 g/dL (ref 31.5–35.7)
MCV: 85 fL (ref 79–97)
Monocytes Absolute: 0.5 10*3/uL (ref 0.1–0.9)
Monocytes: 7 %
Neutrophils Absolute: 3 10*3/uL (ref 1.4–7.0)
Neutrophils: 42 %
Platelets: 209 10*3/uL (ref 150–450)
RBC: 4.5 x10E6/uL (ref 3.77–5.28)
RDW: 13.5 % (ref 11.7–15.4)
WBC: 7.1 10*3/uL (ref 3.4–10.8)

## 2021-05-10 ENCOUNTER — Ambulatory Visit (INDEPENDENT_AMBULATORY_CARE_PROVIDER_SITE_OTHER): Payer: Medicare Other

## 2021-05-10 VITALS — Ht 65.0 in | Wt 170.0 lb

## 2021-05-10 DIAGNOSIS — Z0000 Encounter for general adult medical examination without abnormal findings: Secondary | ICD-10-CM | POA: Diagnosis not present

## 2021-05-10 NOTE — Progress Notes (Signed)
Subjective:   Stephanie Gonzalez is a 80 y.o. female who presents for Medicare Annual (Subsequent) preventive examination.  Virtual Visit via Telephone Note  I connected with  Stephanie Gonzalez on 05/10/21 at  1:15 PM EST by telephone and verified that I am speaking with the correct person using two identifiers.  Location: Patient: Home Provider: [facility] Persons participating in the virtual visit: patient/Nurse Health Advisor   I discussed the limitations, risks, security and privacy concerns of performing an evaluation and management service by telephone and the availability of in person appointments. The patient expressed understanding and agreed to proceed.  Interactive audio and video telecommunications were attempted between this nurse and patient, however failed, due to patient having technical difficulties OR patient did not have access to video capability.  We continued and completed visit with audio only.  Some vital signs may be absent or patient reported.   Farrin Shadle E Calina Patrie, LPN   Review of Systems     Cardiac Risk Factors include: advanced age (>56men, >32 women);diabetes mellitus;dyslipidemia;hypertension;sedentary lifestyle     Objective:    Today's Vitals   05/10/21 1319 05/10/21 1321  Weight: 170 lb (77.1 kg)   Height: 5\' 5"  (1.651 m)   PainSc:  4    Body mass index is 28.29 kg/m.  Advanced Directives 05/10/2021 05/09/2020 04/27/2019 08/04/2017 12/25/2016 12/24/2016 07/01/2016  Does Patient Have a Medical Advance Directive? Yes No Yes No No No No  Type of 07/03/2016 of [place];Living will - Living will - - - -  Does patient want to make changes to medical advance directive? - - No - Patient declined - - - -  Copy of Healthcare Power of Attorney in Chart? No - copy requested - - - - - -  Would patient like information on creating a medical advance directive? - No - Patient declined - - No - Patient declined No - Patient declined -    Current  Medications (verified) Outpatient Encounter Medications as of 05/10/2021  Medication Sig   albuterol (VENTOLIN HFA) 108 (90 Base) MCG/ACT inhaler Inhale 2 puffs into the lungs every 6 (six) hours as needed for wheezing.   amLODipine (NORVASC) 5 MG tablet Take 1 tablet (5 mg total) by mouth daily.   atorvastatin (LIPITOR) 40 MG tablet Take 1 tablet (40 mg total) by mouth daily.   b complex vitamins tablet Take 1 tablet by mouth daily.   cetirizine (ZYRTEC) 10 MG tablet TAKE 1 TABLET BY MOUTH EVERYDAY AT BEDTIME   clonazePAM (KLONOPIN) 1 MG tablet Take 1 tablet (1 mg total) by mouth at bedtime.   famotidine (PEPCID) 20 MG tablet Take 1 tablet (20 mg total) by mouth 2 (two) times daily.   fenofibrate 160 MG tablet Take 1 tablet (160 mg total) by mouth daily. For cholesterol and triglyceride   glucose blood (FREESTYLE LITE) test strip USE TO CHECK BLOOD SUGAR DAILY AND PRN   HYDROcodone-acetaminophen (NORCO) 10-325 MG tablet Take 0.5 tablets by mouth 2 (two) times daily as needed for moderate pain. (Patient taking differently: Take 0.5 tablets by mouth every 6 (six) hours as needed for moderate pain.)   Lancets (FREESTYLE) lancets USE TO CHECK BLOOD SUGAR DAILY AND PRN   LYSINE PO Take 1 tablet by mouth daily as needed (for cold sores).    metFORMIN (GLUCOPHAGE) 500 MG tablet Take 1 tablet (500 mg total) by mouth daily with breakfast.   sertraline (ZOLOFT) 50 MG tablet Take 0.5-1 tablets (25-50 mg  total) by mouth daily.   No facility-administered encounter medications on file as of 05/10/2021.    Allergies (verified) Other and Tetracycline   History: Past Medical History:  Diagnosis Date   Allergy    Arthritis    ASTHMA 10/06/2007   Bronchial asthma    BURSITIS, RIGHT HIP 02/04/2010   DIABETES MELLITUS, TYPE II 11/01/2009   Family history of adverse reaction to anesthesia    Mother had n/v   H/O vertigo    History of kidney stones    passed on her own   HYPERLIPIDEMIA 10/06/2007    HYPERTENSION 10/06/2007   Pneumonia    Restless leg syndrome    Past Surgical History:  Procedure Laterality Date   ABDOMINAL HYSTERECTOMY     APPENDECTOMY     CARDIAC CATHETERIZATION  1992   ? 90's   CHOLECYSTECTOMY     COLONOSCOPY     KNEE ARTHROSCOPY Right    SPINAL CORD STIMULATOR INSERTION N/A 07/13/2014   Procedure: LUMBAR SPINAL CORD STIMULATOR INSERTION;  Surgeon: Venita Lick, MD;  Location: [facility] OR;  Service: Orthopedics;  Laterality: N/A;   SPINE SURGERY     TONSILLECTOMY     TUBAL LIGATION     Family History  Problem Relation Age of Onset   Alzheimer's disease Mother    Cancer Father    Alcoholism Father    Breast cancer Neg Hx    Social History   Socioeconomic History   Marital status: Married    Spouse name: Fayrene Fearing   Number of children: 3   Years of education: 13   Highest education level: Some college, no degree  Occupational History    Employer: RETIRED  Tobacco Use   Smoking status: Never   Smokeless tobacco: Never  Vaping Use   Vaping Use: Never used  Substance and Sexual Activity   Alcohol use: No   Drug use: No   Sexual activity: Not Currently    Birth control/protection: Surgical  Other Topics Concern   Not on file  Social History Narrative   Not on file   Social Determinants of Health   Financial Resource Strain: Low Risk    Difficulty of Paying Living Expenses: Not hard at all  Food Insecurity: No Food Insecurity   Worried About Programme researcher, broadcasting/film/video in the Last Year: Never true   Ran Out of Food in the Last Year: Never true  Transportation Needs: No Transportation Needs   Lack of Transportation (Medical): No   Lack of Transportation (Non-Medical): No  Physical Activity: Insufficiently Active   Days of Exercise per Week: 7 days   Minutes of Exercise per Session: 20 min  Stress: No Stress Concern Present   Feeling of Stress : Not at all  Social Connections: Socially Integrated   Frequency of Communication with Friends and Family: More  than three times a week   Frequency of Social Gatherings with Friends and Family: More than three times a week   Attends Religious Services: More than 4 times per year   Active Member of [employer] or Organizations: Yes   Attends Engineer, structural: More than 4 times per year   Marital Status: Married    Tobacco Counseling Counseling given: Not Answered   Clinical Intake:  Pre-visit preparation completed: Yes  Pain : 0-10 Pain Score: 4  Pain Type: Chronic pain Pain Location: Back Pain Orientation: Mid, Lower Pain Descriptors / Indicators: Aching, Discomfort, Sore Pain Onset: More than a month ago Pain Frequency: Intermittent  BMI - recorded: 28.29 Nutritional Status: BMI 25 -29 Overweight Nutritional Risks: None Diabetes: Yes CBG done?: No Did pt. bring in CBG monitor from home?: No  How often do you need to have someone help you when you read instructions, pamphlets, or other written materials from your doctor or pharmacy?: 1 - Never  Diabetic? Nutrition Risk Assessment:  Has the patient had any N/V/D within the last 2 months?  No  Does the patient have any non-healing wounds?  No  Has the patient had any unintentional weight loss or weight gain?  No   Diabetes:  Is the patient diabetic?  Yes  If diabetic, was a CBG obtained today?  No  Did the patient bring in their glucometer from home?  No  How often do you monitor your CBG's? never.   Financial Strains and Diabetes Management:  Are you having any financial strains with the device, your supplies or your medication? No .  Does the patient want to be seen by Chronic Care Management for management of their diabetes?  No  Would the patient like to be referred to a Nutritionist or for Diabetic Management?  No   Diabetic Exams:  Diabetic Eye Exam: Completed 05/03/2021.   Diabetic Foot Exam: Completed 08/07/2020. Pt has been advised about the importance in completing this exam. Pt is scheduled for  diabetic foot exam on next 10/22/2021.    Interpreter Needed?: No  Information entered by :: Charlise Giovanetti, LPN   Activities of Daily Living In your present state of health, do you have any difficulty performing the following activities: 05/10/2021  Hearing? N  Vision? Y  Comment macular degeneration  Difficulty concentrating or making decisions? Y  Comment mild  Walking or climbing stairs? Y  Comment has to hold on and take one at a time, slowly  Dressing or bathing? N  Doing errands, shopping? N  Preparing Food and eating ? N  Using the Toilet? N  In the past six months, have you accidently leaked urine? Y  Comment mild -only if she hears water or holds too long  Do you have problems with loss of bowel control? N  Managing your Medications? N  Managing your Finances? N  Housekeeping or managing your Housekeeping? N  Some recent data might be hidden    Patient Care Team: Mechele Claude, MD as PCP - General (Family Medicine)  Indicate any recent Medical Services you may have received from other than [facility] providers in the past year (date may be approximate).     Assessment:   This is a routine wellness examination for Stephanie Gonzalez.  Hearing/Vision screen Hearing Screening - Comments:: Denies hearing difficulties  Vision Screening - Comments:: Wears rx glasses - up to date with annual eye exams - [facility] - macular degeneration  Dietary issues and exercise activities discussed: Current Exercise Habits: Home exercise routine, Type of exercise: walking;Other - see comments (house work, yard work), Time (Minutes): 20, Frequency (Times/Week): 7, Weekly Exercise (Minutes/Week): 140, Intensity: Mild, Exercise limited by: orthopedic condition(s)   Goals Addressed             This Visit's Progress    DIET - INCREASE WATER INTAKE   On track    Try to drink 6-8 glasses of water daily     Patient Stated   On track    05/09/2020 AWV Goal: Exercise for General Health  Patient  will verbalize understanding of the benefits of increased physical activity: Exercising regularly is important. It  will improve your overall fitness, flexibility, and endurance. Regular exercise also will improve your overall health. It can help you control your weight, reduce stress, and improve your bone density. Over the next year, patient will increase physical activity as tolerated with a goal of at least 150 minutes of moderate physical activity per week.  You can tell that you are exercising at a moderate intensity if your heart starts beating faster and you start breathing faster but can still hold a conversation. Moderate-intensity exercise ideas include: Walking 1 mile (1.6 km) in about 15 minutes Biking Hiking Golfing Dancing Water aerobics Patient will verbalize understanding of everyday activities that increase physical activity by providing examples like the following: Yard work, such as: Insurance underwriter Gardening Washing windows or floors Patient will be able to explain general safety guidelines for exercising:  Before you start a new exercise program, talk with your health care provider. Do not exercise so much that you hurt yourself, feel dizzy, or get very short of breath. Wear comfortable clothes and wear shoes with good support. Drink plenty of water while you exercise to prevent dehydration or heat stroke. Work out until your breathing and your heartbeat get faster.        Depression Screen PHQ 2/9 Scores 05/10/2021 04/24/2021 10/29/2020 08/07/2020 05/09/2020 02/21/2020 02/08/2020  PHQ - 2 Score 0 0 0 0 0 0 0  PHQ- 9 Score - - - - - - -    Fall Risk Fall Risk  05/10/2021 04/24/2021 10/29/2020 08/07/2020 05/09/2020  Falls in the past year? 0 0 0 0 0  Number falls in past yr: 0 - - - -  Injury with Fall? 0 - - - -  Risk for fall due to : No Fall Risks - - - -  Follow up Falls prevention  discussed - - Falls evaluation completed Falls evaluation completed    FALL RISK PREVENTION PERTAINING TO THE HOME:  Any stairs in or around the home? Yes  If so, are there any without handrails? No  Home free of loose throw rugs in walkways, pet beds, electrical cords, etc? Yes  Adequate lighting in your home to reduce risk of falls? Yes   ASSISTIVE DEVICES UTILIZED TO PREVENT FALLS:  Life alert? No  Use of a cane, walker or w/c? No  Grab bars in the bathroom? Yes  Shower chair or bench in shower? Yes  Elevated toilet seat or a handicapped toilet? Yes   TIMED UP AND GO:  Was the test performed? No . Telephonic visit  Cognitive Function: Normal cognitive status assessed by direct observation by this Nurse Health Advisor. No abnormalities found.    MMSE - Mini Mental State Exam 08/04/2017 07/01/2016  Not completed: (No Data) (No Data)     6CIT Screen 05/09/2020 04/27/2019  What Year? 0 points 0 points  What month? 0 points 0 points  What time? 0 points 0 points  Count back from 20 0 points 0 points  Months in reverse 0 points 0 points  Repeat phrase 0 points 0 points  Total Score 0 0    Immunizations Immunization History  Administered Date(s) Administered   Fluad Quad(high Dose 65+) 04/07/2019, 04/04/2020, 04/11/2021   Influenza Split 04/16/2012   Influenza Whole 03/24/2007, 03/09/2008, 03/06/2009, 02/04/2010, 02/18/2011   Influenza, High Dose Seasonal PF 03/26/2016, 03/24/2017, 03/23/2018   Influenza,inj,Quad PF,6+ Mos 04/04/2013, 04/10/2014, 03/09/2015   Influenza,inj,quad, With Preservative 03/09/2017  PFIZER Comirnaty(Gray Top)Covid-19 Tri-Sucrose Vaccine 07/19/2019, 08/16/2019, 06/06/2020   Pneumococcal Conjugate-13 06/13/2014   Pneumococcal Polysaccharide-23 04/02/2010   Td 04/02/2010   Zoster, Live 02/18/2011    TDAP status: Due, Education has been provided regarding the importance of this vaccine. Advised may receive this vaccine at local pharmacy or Health  Dept. Aware to provide a copy of the vaccination record if obtained from local pharmacy or Health Dept. Verbalized acceptance and understanding.  Flu Vaccine status: Up to date  Pneumococcal vaccine status: Up to date  Covid-19 vaccine status: Completed vaccines  Qualifies for Shingles Vaccine? Yes   Zostavax completed Yes   Shingrix Completed?: No.    Education has been provided regarding the importance of this vaccine. Patient has been advised to call insurance company to determine out of pocket expense if they have not yet received this vaccine. Advised may also receive vaccine at local pharmacy or Health Dept. Verbalized acceptance and understanding.  Screening Tests Health Maintenance  Topic Date Due   Zoster Vaccines- Shingrix (1 of 2) Never done   OPHTHALMOLOGY EXAM  10/29/2018   TETANUS/TDAP  08/07/2021 (Originally 04/02/2020)   FOOT EXAM  08/07/2021   URINE MICROALBUMIN  08/08/2021   HEMOGLOBIN A1C  10/22/2021   Pneumonia Vaccine 64+ Years old  Completed   INFLUENZA VACCINE  Completed   DEXA SCAN  Completed   Hepatitis C Screening  Completed   HPV VACCINES  Aged Out   COVID-19 Vaccine  Discontinued    Health Maintenance  Health Maintenance Due  Topic Date Due   Zoster Vaccines- Shingrix (1 of 2) Never done   OPHTHALMOLOGY EXAM  10/29/2018    Colorectal cancer screening: No longer required.   Mammogram status: Completed 11/07/2020. Repeat every year  Bone Density status: Completed 07/25/2016. Results reflect: Bone density results: OSTEOPENIA. Repeat every 2 years. declines  Lung Cancer Screening: (Low Dose CT Chest recommended if Age 53-80 years, 30 pack-year currently smoking OR have quit w/in 15years.) does not qualify.   Additional Screening:  Hepatitis C Screening: does not qualify  Vision Screening: Recommended annual ophthalmology exams for early detection of glaucoma and other disorders of the eye. Is the patient up to date with their annual eye exam?   Yes  Who is the provider or what is the name of the office in which the patient attends annual eye exams? [facility] If pt is not established with a provider, would they like to be referred to a provider to establish care? No .   Dental Screening: Recommended annual dental exams for proper oral hygiene  Community Resource Referral / Chronic Care Management: CRR required this visit?  No   CCM required this visit?  No      Plan:     I have personally reviewed and noted the following in the patient's chart:   Medical and social history Use of alcohol, tobacco or illicit drugs  Current medications and supplements including opioid prescriptions.  Functional ability and status Nutritional status Physical activity Advanced directives List of other physicians Hospitalizations, surgeries, and ER visits in previous 12 months Vitals Screenings to include cognitive, depression, and falls Referrals and appointments  In addition, I have reviewed and discussed with patient certain preventive protocols, quality metrics, and best practice recommendations. A written personalized care plan for preventive services as well as general preventive health recommendations were provided to patient.     [place] Constable, LPN   16/06/958   Nurse Notes: None

## 2021-05-10 NOTE — Patient Instructions (Signed)
Stephanie Gonzalez , Thank you for taking time to come for your Medicare Wellness Visit. I appreciate your ongoing commitment to your health goals. Please review the following plan we discussed and let me know if I can assist you in the future.   Screening recommendations/referrals: Colonoscopy: No longer required Mammogram: Done 11/07/2020 - Repeat annually  Bone Density: Done 07/25/2016 - Repeat every 2 years *Due Recommended yearly ophthalmology/optometry visit for glaucoma screening and checkup Recommended yearly dental visit for hygiene and checkup  Vaccinations: Influenza vaccine: Done 04/11/2021 - Repeat annually  Pneumococcal vaccine: Done 06/13/2014 & 04/02/2010 Tdap vaccine: Done 04/02/2010 - Repeat in 10 years *due Shingles vaccine: Zostavax done 2012 - Shingrix #1 and #2 due   Covid-19: Done 07/19/2019, 08/16/2019, & 06/06/2020  Advanced directives: Please bring a copy of your health care power of attorney and living will to the office to be added to your chart at your convenience.   Conditions/risks identified: Aim for 30 minutes of exercise or brisk walking each day, drink 6-8 glasses of water and eat lots of fruits and vegetables.   Next appointment: Follow up in one year for your annual wellness visit    Preventive Care 65 Years and Older, Female Preventive care refers to lifestyle choices and visits with your health care provider that can promote health and wellness. What does preventive care include? A yearly physical exam. This is also called an annual well check. Dental exams once or twice a year. Routine eye exams. Ask your health care provider how often you should have your eyes checked. Personal lifestyle choices, including: Daily care of your teeth and gums. Regular physical activity. Eating a healthy diet. Avoiding tobacco and drug use. Limiting alcohol use. Practicing safe sex. Taking low-dose aspirin every day. Taking vitamin and mineral supplements as recommended by  your health care provider. What happens during an annual well check? The services and screenings done by your health care provider during your annual well check will depend on your age, overall health, lifestyle risk factors, and family history of disease. Counseling  Your health care provider may ask you questions about your: Alcohol use. Tobacco use. Drug use. Emotional well-being. Home and relationship well-being. Sexual activity. Eating habits. History of falls. Memory and ability to understand (cognition). Work and work Astronomer. Reproductive health. Screening  You may have the following tests or measurements: Height, weight, and BMI. Blood pressure. Lipid and cholesterol levels. These may be checked every 5 years, or more frequently if you are over 24 years old. Skin check. Lung cancer screening. You may have this screening every year starting at age 62 if you have a 30-pack-year history of smoking and currently smoke or have quit within the past 15 years. Fecal occult blood test (FOBT) of the stool. You may have this test every year starting at age 17. Flexible sigmoidoscopy or colonoscopy. You may have a sigmoidoscopy every 5 years or a colonoscopy every 10 years starting at age 73. Hepatitis C blood test. Hepatitis B blood test. Sexually transmitted disease (STD) testing. Diabetes screening. This is done by checking your blood sugar (glucose) after you have not eaten for a while (fasting). You may have this done every 1-3 years. Bone density scan. This is done to screen for osteoporosis. You may have this done starting at age 30. Mammogram. This may be done every 1-2 years. Talk to your health care provider about how often you should have regular mammograms. Talk with your health care provider about your test  results, treatment options, and if necessary, the need for more tests. Vaccines  Your health care provider may recommend certain vaccines, such as: Influenza  vaccine. This is recommended every year. Tetanus, diphtheria, and acellular pertussis (Tdap, Td) vaccine. You may need a Td booster every 10 years. Zoster vaccine. You may need this after age 61. Pneumococcal 13-valent conjugate (PCV13) vaccine. One dose is recommended after age 5. Pneumococcal polysaccharide (PPSV23) vaccine. One dose is recommended after age 47. Talk to your health care provider about which screenings and vaccines you need and how often you need them. This information is not intended to replace advice given to you by your health care provider. Make sure you discuss any questions you have with your health care provider. Document Released: 06/22/2015 Document Revised: 02/13/2016 Document Reviewed: 03/27/2015 Elsevier Interactive Patient Education  2017 [place] Prevention in the Home Falls can cause injuries. They can happen to people of all ages. There are many things you can do to make your home safe and to help prevent falls. What can I do on the outside of my home? Regularly fix the edges of walkways and driveways and fix any cracks. Remove anything that might make you trip as you walk through a door, such as a raised step or threshold. Trim any bushes or trees on the path to your home. Use bright outdoor lighting. Clear any walking paths of anything that might make someone trip, such as rocks or tools. Regularly check to see if handrails are loose or broken. Make sure that both sides of any steps have handrails. Any raised decks and porches should have guardrails on the edges. Have any leaves, snow, or ice cleared regularly. Use sand or salt on walking paths during winter. Clean up any spills in your garage right away. This includes oil or grease spills. What can I do in the bathroom? Use night lights. Install grab bars by the toilet and in the tub and shower. Do not use towel bars as grab bars. Use non-skid mats or decals in the tub or shower. If you  need to sit down in the shower, use a plastic, non-slip stool. Keep the floor dry. Clean up any water that spills on the floor as soon as it happens. Remove soap buildup in the tub or shower regularly. Attach bath mats securely with double-sided non-slip rug tape. Do not have throw rugs and other things on the floor that can make you trip. What can I do in the bedroom? Use night lights. Make sure that you have a light by your bed that is easy to reach. Do not use any sheets or blankets that are too big for your bed. They should not hang down onto the floor. Have a firm chair that has side arms. You can use this for support while you get dressed. Do not have throw rugs and other things on the floor that can make you trip. What can I do in the kitchen? Clean up any spills right away. Avoid walking on wet floors. Keep items that you use a lot in easy-to-reach places. If you need to reach something above you, use a strong step stool that has a grab bar. Keep electrical cords out of the way. Do not use floor polish or wax that makes floors slippery. If you must use wax, use non-skid floor wax. Do not have throw rugs and other things on the floor that can make you trip. What can I do with my stairs?  Do not leave any items on the stairs. Make sure that there are handrails on both sides of the stairs and use them. Fix handrails that are broken or loose. Make sure that handrails are as long as the stairways. Check any carpeting to make sure that it is firmly attached to the stairs. Fix any carpet that is loose or worn. Avoid having throw rugs at the top or bottom of the stairs. If you do have throw rugs, attach them to the floor with carpet tape. Make sure that you have a light switch at the top of the stairs and the bottom of the stairs. If you do not have them, ask someone to add them for you. What else can I do to help prevent falls? Wear shoes that: Do not have high heels. Have rubber  bottoms. Are comfortable and fit you well. Are closed at the toe. Do not wear sandals. If you use a stepladder: Make sure that it is fully opened. Do not climb a closed stepladder. Make sure that both sides of the stepladder are locked into place. Ask someone to hold it for you, if possible. Clearly mark and make sure that you can see: Any grab bars or handrails. First and last steps. Where the edge of each step is. Use tools that help you move around (mobility aids) if they are needed. These include: Canes. Walkers. Scooters. Crutches. Turn on the lights when you go into a dark area. Replace any light bulbs as soon as they burn out. Set up your furniture so you have a clear path. Avoid moving your furniture around. If any of your floors are uneven, fix them. If there are any pets around you, be aware of where they are. Review your medicines with your doctor. Some medicines can make you feel dizzy. This can increase your chance of falling. Ask your doctor what other things that you can do to help prevent falls. This information is not intended to replace advice given to you by your health care provider. Make sure you discuss any questions you have with your health care provider. Document Released: 03/22/2009 Document Revised: 11/01/2015 Document Reviewed: 06/30/2014 Elsevier Interactive Patient Education  2017 [employer].

## 2021-08-05 LAB — HM DIABETES EYE EXAM

## 2021-09-03 ENCOUNTER — Other Ambulatory Visit: Payer: Self-pay | Admitting: Physician Assistant

## 2021-09-03 DIAGNOSIS — M25562 Pain in left knee: Secondary | ICD-10-CM

## 2021-09-19 ENCOUNTER — Other Ambulatory Visit: Payer: Self-pay | Admitting: Physician Assistant

## 2021-09-19 ENCOUNTER — Ambulatory Visit
Admission: RE | Admit: 2021-09-19 | Discharge: 2021-09-19 | Disposition: A | Discharge status: Home / Self Care | Payer: Medicare Other | Source: Ambulatory Visit | Attending: Physician Assistant | Admitting: Physician Assistant

## 2021-09-19 DIAGNOSIS — M25562 Pain in left knee: Secondary | ICD-10-CM

## 2021-09-19 MED ORDER — IOPAMIDOL (ISOVUE-M 200) INJECTION 41%
40.0000 mL | Freq: Once | INTRAMUSCULAR | Status: AC
  Administered 2021-09-19: 40 mL via INTRA_ARTICULAR

## 2021-09-26 ENCOUNTER — Encounter: Payer: Self-pay | Admitting: Physical Therapy

## 2021-09-26 ENCOUNTER — Ambulatory Visit: Payer: Medicare Other | Attending: Orthopedic Surgery | Admitting: Physical Therapy

## 2021-09-26 DIAGNOSIS — M25562 Pain in left knee: Secondary | ICD-10-CM | POA: Insufficient documentation

## 2021-09-26 NOTE — Therapy (Signed)
[place] ?[facility] ?[address] ?[place], [place], [address] ?Phone: [phone]   Fax:  [phone] ? ?Physical Therapy Evaluation ? ?Patient Details  ?Name: Stephanie Gonzalez ?MRN: [id number] ?Date of Birth: [birth year] ?Referring Provider (PT): Malon Kindle MD ? ? ?Encounter Date: 09/26/2021 ? ? PT End of Session - 09/26/21 1332   ? ? Visit Number 1   ? Number of Visits 4   ? Date for PT Re-Evaluation 10/24/21   ? PT Start Time 1215   ? PT Stop Time 1312   ? PT Time Calculation (min) 57 min   ? Activity Tolerance Patient tolerated treatment well   ? Behavior During Therapy [facility] for tasks assessed/performed   ? ?  ?  ? ?  ? ? ?Past Medical History:  ?Diagnosis Date  ? Allergy   ? Arthritis   ? ASTHMA 10/06/2007  ? Bronchial asthma   ? BURSITIS, RIGHT HIP 02/04/2010  ? DIABETES MELLITUS, TYPE II 11/01/2009  ? Family history of adverse reaction to anesthesia   ? Mother had n/v  ? H/O vertigo   ? History of kidney stones   ? passed on her own  ? HYPERLIPIDEMIA 10/06/2007  ? HYPERTENSION 10/06/2007  ? Pneumonia   ? Restless leg syndrome   ? ? ?Past Surgical History:  ?Procedure Laterality Date  ? ABDOMINAL HYSTERECTOMY    ? APPENDECTOMY    ? CARDIAC CATHETERIZATION  1992  ? ? 94's  ? CHOLECYSTECTOMY    ? COLONOSCOPY    ? KNEE ARTHROSCOPY Right   ? SPINAL CORD STIMULATOR INSERTION N/A 07/13/2014  ? Procedure: LUMBAR SPINAL CORD STIMULATOR INSERTION;  Surgeon: Venita Lick, MD;  Location: [facility] OR;  Service: Orthopedics;  Laterality: N/A;  ? SPINE SURGERY    ? TONSILLECTOMY    ? TUBAL LIGATION    ? ? ?There were no vitals filed for this visit. ? ? ? Subjective Assessment - 09/26/21 1337   ? ? Subjective The patient presents to the clinci today with c/o left knee pain that began on 08/15/21.  Initially she was in a great deal of pain.  She has a knee brace now with a patellar orifice and is using a cane which has helped.  She also stays active and has been doing range of motion exercises.  Her pain  is rated at a 4/10 today and can be higher when she first gets up but after she walks a short distance her pain goes down.  Icing also helps decrease her pain.   ? Pertinent History OA, right knee surgery, spinal cord stimulator, lumbar surgery, RLS, DM.   ? How long can you sit comfortably? Unlimited.   ? How long can you stand comfortably? Not problematic.   ? How long can you walk comfortably? Initially walking is quite painful but gets better as she walks.  A straight cane helps.   ? Diagnostic tests CT:  1. Complex tear of the posterior horn of the medial meniscus with  peripheral meniscal extrusion. Small radial tear of the free edge of  the body of the medial meniscus.  2. 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   ? Patient Stated Goals Get out of pain.   ? Currently in Pain? Yes   ? Pain Score 4    ? Pain Location Knee   ? Pain Orientation Left   ? Pain Descriptors / Indicators Throbbing   ? Pain Type Acute pain   ?  Pain Onset More than a month ago   ? Pain Frequency Constant   ? Aggravating Factors  See above.   ? Pain Relieving Factors See above.   ? ?  ?  ? ?  ? ? ? ? ? [facility] PT Assessment - 09/26/21 0001   ? ?  ? Assessment  ? Medical Diagnosis Pain in left knee.   ? Referring Provider (PT) Esmond Plants MD   ? Onset Date/Surgical Date 08/15/21   ?  ? Precautions  ? Precaution Comments Pain-free ther ex.   ?  ? Restrictions  ? Weight Bearing Restrictions No   ?  ? Balance Screen  ? Has the patient fallen in the past 6 months No   ? Has the patient had a decrease in activity level because of a fear of falling?  No   ? Is the patient reluctant to leave their home because of a fear of falling?  No   ?  ? Home Environment  ? Living Environment Private residence   ?  ? Prior Function  ? Level of Independence Independent   ?  ? Observation/Other Assessments-Edema   ? Edema Circumferential   ?  ? Circumferential Edema  ? Circumferential - Left  LT is 1 cm > RT.   ?   ? ROM / Strength  ? AROM / PROM / Strength AROM;Strength   ?  ? AROM  ? Overall AROM Comments Full active left knee flexion and extension.   ?  ? Strength  ? Overall Strength Comments Left knee extension is 4+/5, left hip flexion is 4 to 4+/5.   ?  ? Palpation  ? Palpation comment Tender to palpation over left knee medial joint line.   ?  ? Ambulation/Gait  ? Gait Comments Mild gait antalgia observed with patient using a straight cane.   ? ?  ?  ? ?  ? ? ? ? ? ? ? ? ? ? ? ? ? ?Objective measurements completed on examination: See above findings.  ? ? ? ? ? [place] Adult PT Treatment/Exercise - 09/26/21 0001   ? ?  ? Modalities  ? Modalities Electrical Stimulation;Vasopneumatic   ?  ? Electrical Stimulation  ? Electrical Stimulation Location Left medial knee.   ? Electrical Stimulation Action Pre-mod.   ? Electrical Stimulation Parameters 80-150 Hz. x 20 minutes.   ? Electrical Stimulation Goals Edema;Pain   ?  ? Vasopneumatic  ? Number Minutes Vasopneumatic  20 minutes   ? Vasopnuematic Location  --   Left knee.  ? Vasopneumatic Pressure Low   ? ?  ?  ? ?  ? ? ? ? ? ? ? ? ? ? ? ? ? ? ? PT Long Term Goals - 09/26/21 1406   ? ?  ? PT LONG TERM GOAL #1  ? Title Independent with a HEP.   ? Time 4   ? Period Weeks   ? Status New   ?  ? PT LONG TERM GOAL #2  ? Title Perform ADL's with left knee pain not > 2/10.   ? Time 4   ? Period Weeks   ? Status New   ? ?  ?  ? ?  ? ? ? ? ? ? ? ? ? Plan - 09/26/21 1400   ? ? Clinical Impression Statement The patient presents to [facility] with c/o left knee pain that has ongoing since 08/15/21.  Her knee has improved with a brace and  use of a stright cane.  She had minimal edema today.  She has some minimal weakness but her active range of motion is full.  She is tender to palpation over her left medial joint line.  She may only attende today's visit.  She was provided with an HEP, information of how to obtain a TENS unit and performing a short duration ice massage should she prefer this over  using an ice pack which she has also been performing at home.  I told her to call the clinic should she feels she needs further treatment.  Patient will benefit from skilled physical therapy intervention to address pain and deficits.   ? Personal Factors and Comorbidities Other;Comorbidity 1;Comorbidity 2   ? Comorbidities OA, right knee surgery, spinal cord stimulator, lumbar surgery, RLS, DM.   ? Examination-Activity Limitations Other;Locomotion Level   ? Examination-Participation Restrictions Other   ? Stability/Clinical Decision Making Stable/Uncomplicated   ? Clinical Decision Making Low   ? Rehab Potential Excellent   ? PT Frequency 1x / week   ? PT Duration 4 weeks   ? PT Treatment/Interventions ADLs/Self Care Home Management;Cryotherapy;Electrical Stimulation;Ultrasound;Moist Heat;Iontophoresis 4mg /ml Dexamethasone;Gait training;Functional mobility training;Therapeutic activities;Therapeutic exercise;Manual techniques;Patient/family education;Vasopneumatic Device   ? PT Next Visit Plan Pain-free left LE ther ex.  Modalitiesand STW/M as needed.   ? Consulted and Agree with Plan of Care Patient   ? ?  ?  ? ?  ? ? ?Patient will benefit from skilled therapeutic intervention in order to improve the following deficits and impairments:  Pain, Abnormal gait, Increased edema, Decreased strength, Decreased activity tolerance ? ?Visit Diagnosis: ?Acute pain of left knee - Plan: PT plan of care cert/re-cert ? ? ? ? ?Problem List ?Patient Active Problem List  ? Diagnosis Date Noted  ? Degeneration of lumbar intervertebral disc 06/07/2018  ? Spinal stenosis of lumbar region 06/07/2018  ? Restless legs syndrome (RLS) 01/28/2018  ? 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 S/P lumbar spinal fusion 12/25/2016  ? Chronic pain 07/13/2014  ? Shingles 09/16/2010  ? Nephrolithiasis 09/03/2010  ? BURSITIS, RIGHT HIP 02/04/2010  ? Diabetes mellitus without complication ([place]) 0000000  ? Dyslipidemia 10/06/2007  ?  Essential hypertension 10/06/2007  ? Asthma 10/06/2007  ? ? ?Tamkia Temples, [place], PT ?09/26/2021, 2:08 PM ? ?[place] ?[facility] ?[address] ?[place], [place], [address] ?P

## 2021-09-26 NOTE — Patient Instructions (Signed)
PROGHROAMME EXERCISE PROGRAM ?Created by [place] Darren Caldron Apr 20th, 2023 ?View at www.my-exercise-code.com using code: [email or web address] ?Total 3 Page 1 of 1 ?ICE MASSAGE TO PATELLAR LIGAMENT ?Place direct ice from an ice massage cup to the inside of your ?knee where it hurts. Move the ice in a circular motion for up to 2 to ?3 minutes (no more). Use towels to catch the water drippings. ?You should feel 4 stages of sensations starting with... ?1. Uncomfortable sensation of cold, then ?2. Stinging, then ?3. Burning or aching feeling, then ?4. Numbness ?If the pain is too great to handle, lift it off your skin for a few ?seconds, dab with towel and then place it back on for a few ?circular motions and repeat. ?Do not perform for more than 3 minutes. Use a timer to be safe. ?Duration 3 Minutes ?Perform 2 Times a Day ?PEANUT BALL - SHORT ARC QUAD - SAQ ?Start by placing a peanut ball (rolled up pillow) under your knee. ?Next, slowly raise your lower leg and foot as you straighten your ?knee. Return to starting position and repeat. ?Repeat 20 Times Hold 3 Seconds ?Complete 2 Sets Perform 2 Times a Day ?

## 2021-10-02 ENCOUNTER — Other Ambulatory Visit: Payer: Self-pay | Admitting: Family Medicine

## 2021-10-22 ENCOUNTER — Ambulatory Visit: Payer: Medicare Other | Admitting: Family Medicine

## 2021-10-22 ENCOUNTER — Encounter: Payer: Self-pay | Admitting: Family Medicine

## 2021-10-22 VITALS — BP 161/63 | HR 62 | Temp 98.2°F | Ht 65.0 in | Wt 163.6 lb

## 2021-10-22 DIAGNOSIS — E785 Hyperlipidemia, unspecified: Secondary | ICD-10-CM | POA: Diagnosis not present

## 2021-10-22 DIAGNOSIS — I10 Essential (primary) hypertension: Secondary | ICD-10-CM | POA: Diagnosis not present

## 2021-10-22 DIAGNOSIS — G2581 Restless legs syndrome: Secondary | ICD-10-CM

## 2021-10-22 DIAGNOSIS — Z23 Encounter for immunization: Secondary | ICD-10-CM

## 2021-10-22 DIAGNOSIS — E119 Type 2 diabetes mellitus without complications: Secondary | ICD-10-CM

## 2021-10-22 DIAGNOSIS — Z79899 Other long term (current) drug therapy: Secondary | ICD-10-CM

## 2021-10-22 DIAGNOSIS — H3530 Unspecified macular degeneration: Secondary | ICD-10-CM

## 2021-10-22 LAB — BAYER DCA HB A1C WAIVED: HB A1C (BAYER DCA - WAIVED): 4.7 % — ABNORMAL LOW (ref 4.8–5.6)

## 2021-10-22 MED ORDER — CLONAZEPAM 1 MG PO TABS: 1.0000 mg | ORAL_TABLET | Freq: Every day | ORAL | 1 refills | Status: DC

## 2021-10-22 NOTE — Addendum Note (Signed)
Addended by: Adella Hare B on: 10/22/2021 04:21 PM ? ? Modules accepted: Orders ? ?

## 2021-10-22 NOTE — Progress Notes (Signed)
? ?Subjective:  ?Patient ID: Stephanie Gonzalez,  ?female    DOB: [birth year]  Age: 81 y.o.  ? ? ?CC: Medical Management of Chronic Issues ? ? ?HPI ?Stephanie Gonzalez presents for  follow-up of hypertension. Patient has no history of headache chest pain or shortness of breath or recent cough. Patient also denies symptoms of TIA such as numbness weakness lateralizing. Patient denies side effects from medication. States not taking it regularly. Missed medication this morning and was awakened this morning by two calls that upset her. Misses a couple of days a week.  ? ?Patient also  in for follow-up of elevated cholesterol. Doing well without complaints on current medication. Denies side effects  including myalgia and arthralgia and nausea. Also in today for liver function testing. Currently no chest pain, shortness of breath or other cardiovascular related symptoms noted. Misses taking it a couple of days a week.  ? ?Follow-up of diabetes. Patient does not check blood sugar at home.Patient denies symptoms such as excessive hunger or urinary frequency, excessive hunger, nausea ?No significant hypoglycemic spells noted. ?Medications reviewed. Pt reports taking them regularly. Pt. denies complication/adverse reaction today.  ? ? ?History ?Stephanie Gonzalez has a past medical history of Allergy, Arthritis, ASTHMA (10/06/2007), Bronchial asthma, BURSITIS, RIGHT HIP (02/04/2010), DIABETES MELLITUS, TYPE II (11/01/2009), Family history of adverse reaction to anesthesia, H/O vertigo, History of kidney stones, HYPERLIPIDEMIA (10/06/2007), HYPERTENSION (10/06/2007), Pneumonia, and Restless leg syndrome.  ? ?She has a past surgical history that includes Appendectomy; Cholecystectomy; Abdominal hysterectomy; Tubal ligation; Tonsillectomy; Knee arthroscopy (Right); Spinal cord stimulator insertion (N/A, 07/13/2014); Colonoscopy; Cardiac catheterization (1992); and Spine surgery.  ? ?Her family history includes Alcoholism in her father; Alzheimer's disease in  her mother; Cancer in her father.She reports that she has never smoked. She has never used smokeless tobacco. She reports that she does not drink alcohol and does not use drugs. ? ?Current Outpatient Medications on File Prior to Visit  ?Medication Sig Dispense Refill  ? albuterol (VENTOLIN HFA) 108 (90 Base) MCG/ACT inhaler Inhale 2 puffs into the lungs every 6 (six) hours as needed for wheezing.    ? amLODipine (NORVASC) 5 MG tablet Take 1 tablet (5 mg total) by mouth daily. 90 tablet 3  ? atorvastatin (LIPITOR) 40 MG tablet Take 1 tablet (40 mg total) by mouth daily. 90 tablet 3  ? b complex vitamins tablet Take 1 tablet by mouth daily.    ? cetirizine (ZYRTEC) 10 MG tablet TAKE 1 TABLET BY MOUTH EVERYDAY AT BEDTIME 90 tablet 3  ? famotidine (PEPCID) 20 MG tablet Take 1 tablet (20 mg total) by mouth 2 (two) times daily. 180 tablet 3  ? fenofibrate 160 MG tablet TAKE 1 TABLET BY MOUTH  DAILY FOR CHOLESTEROL AND  TRIGLYCERIDE 90 tablet 0  ? glucose blood (FREESTYLE LITE) test strip USE TO CHECK BLOOD SUGAR DAILY AND PRN 100 each 12  ? HYDROcodone-acetaminophen (NORCO) 10-325 MG tablet Take 0.5 tablets by mouth 2 (two) times daily as needed for moderate pain. (Patient taking differently: Take 0.5 tablets by mouth every 6 (six) hours as needed for moderate pain.) 30 tablet 0  ? Lancets (FREESTYLE) lancets USE TO CHECK BLOOD SUGAR DAILY AND PRN 100 each 12  ? LYSINE PO Take 1 tablet by mouth daily as needed (for cold sores).     ? sertraline (ZOLOFT) 50 MG tablet Take 0.5-1 tablets (25-50 mg total) by mouth daily. 90 tablet 2  ? ?No current facility-administered medications on file prior to  visit.  ? ? ?ROS ?Review of Systems  ?Constitutional: Negative.   ?HENT: Negative.    ?Eyes:  Negative for visual disturbance.  ?Respiratory:  Negative for shortness of breath.   ?Cardiovascular:  Negative for chest pain.  ?Gastrointestinal:  Negative for abdominal pain.  ?Musculoskeletal:  Negative for arthralgias.  ? ?Objective:   ?BP (!) 161/63   Pulse 62   Temp 98.2 ?F (36.8 ?C)   Ht _0  (1.651 m)   Wt 163 lb 9.6 oz (74.2 kg)   SpO2 96%   BMI 27.22 kg/m?  ? ?BP Readings from Last 3 Encounters:  ?10/22/21 (!) 161/63  ?04/24/21 (!) 145/67  ?10/29/20 136/79  ? ? ?Wt Readings from Last 3 Encounters:  ?10/22/21 163 lb 9.6 oz (74.2 kg)  ?05/10/21 170 lb (77.1 kg)  ?04/24/21 170 lb 9.6 oz (77.4 kg)  ? ? ? ?Physical Exam ?Constitutional:   ?   General: She is not in acute distress. ?   Appearance: She is well-developed.  ?Cardiovascular:  ?   Rate and Rhythm: Normal rate and regular rhythm.  ?Pulmonary:  ?   Breath sounds: Normal breath sounds.  ?Musculoskeletal:     ?   General: Normal range of motion.  ?Skin: ?   General: Skin is warm and dry.  ?Neurological:  ?   Mental Status: She is alert and oriented to person, place, and time.  ? ? ?Diabetic Foot Exam - Simple   ?No data filed ?  ? ? ?Lab Results  ?Component Value Date  ? HGBA1C 5.1 04/24/2021  ? HGBA1C 5.1 08/07/2020  ? HGBA1C 5.0 02/08/2020  ? ? ?Assessment & Plan:  ? ?Stephanie Gonzalez was seen today for medical management of chronic issues. ? ?Diagnoses and all orders for this visit: ? ?Diabetes mellitus without complication ([place]) ?-     Bayer DCA Hb A1c Waived ?-     Microalbumin / creatinine urine ratio ? ?Essential hypertension ?-     CBC with Differential/Platelet ?-     CMP14+EGFR ? ?Dyslipidemia ?-     Lipid panel ? ?Restless legs syndrome ([place]S) ?-     clonazePAM (KLONOPIN) 1 MG tablet; Take 1 tablet (1 mg total) by mouth at bedtime. For restless legs ? ?Controlled substance agreement signed ?-     ToxASSURE [place] 13 (MW), Urine ? ?Macular degeneration of both eyes, unspecified type ? ? ?I have discontinued Stephanie Gonzalez. Stephanie Gonzalez's Stephanie Gonzalez. I have also changed her clonazePAM. Additionally, I am having her maintain her albuterol, glucose blood, LYSINE PO, b complex vitamins, HYDROcodone-acetaminophen, cetirizine, famotidine, freestyle, amLODipine, atorvastatin, sertraline, and  fenofibrate. ? ?Meds ordered this encounter  ?Medications  ? clonazePAM (KLONOPIN) 1 MG tablet  ?  Sig: Take 1 tablet (1 mg total) by mouth at bedtime. For restless legs  ?  Dispense:  90 tablet  ?  Refill:  1  ?  This request is for a new prescription for a controlled substance as required by Federal/State law.  ? ? ? ?Follow-up: Return in about 1 month (around 11/22/2021) for hypertension. ? ?Claretta Fraise, M.D. ? ?

## 2021-10-23 LAB — CBC WITH DIFFERENTIAL/PLATELET
Basophils Absolute: 0 10*3/uL (ref 0.0–0.2)
Basos: 0 %
EOS (ABSOLUTE): 0.1 10*3/uL (ref 0.0–0.4)
Eos: 1 %
Hematocrit: 41.2 % (ref 34.0–46.6)
Hemoglobin: 13.6 g/dL (ref 11.1–15.9)
Immature Grans (Abs): 0 10*3/uL (ref 0.0–0.1)
Immature Granulocytes: 0 %
Lymphocytes Absolute: 3.2 10*3/uL — ABNORMAL HIGH (ref 0.7–3.1)
Lymphs: 34 %
MCH: 28.7 pg (ref 26.6–33.0)
MCHC: 33 g/dL (ref 31.5–35.7)
MCV: 87 fL (ref 79–97)
Monocytes Absolute: 0.5 10*3/uL (ref 0.1–0.9)
Monocytes: 5 %
Neutrophils Absolute: 5.7 10*3/uL (ref 1.4–7.0)
Neutrophils: 60 %
Platelets: 228 10*3/uL (ref 150–450)
RBC: 4.74 x10E6/uL (ref 3.77–5.28)
RDW: 13.6 % (ref 11.7–15.4)
WBC: 9.5 10*3/uL (ref 3.4–10.8)

## 2021-10-23 LAB — LIPID PANEL
Chol/HDL Ratio: 3.1 ratio (ref 0.0–4.4)
Cholesterol, Total: 174 mg/dL (ref 100–199)
HDL: 57 mg/dL (ref >39)
LDL Chol Calc (NIH): 83 mg/dL (ref 0–99)
Triglycerides: 207 mg/dL — ABNORMAL HIGH (ref 0–149)
VLDL Cholesterol Cal: 34 mg/dL (ref 5–40)

## 2021-10-23 LAB — CMP14+EGFR
ALT: 8 IU/L (ref 0–32)
AST: 14 IU/L (ref 0–40)
Albumin/Globulin Ratio: 1.6 (ref 1.2–2.2)
Albumin: 4.2 g/dL (ref 3.7–4.7)
Alkaline Phosphatase: 111 IU/L (ref 44–121)
BUN/Creatinine Ratio: 12 (ref 12–28)
BUN: 9 mg/dL (ref 8–27)
Bilirubin Total: 0.6 mg/dL (ref 0.0–1.2)
CO2: 27 mmol/L (ref 20–29)
Calcium: 9.7 mg/dL (ref 8.7–10.3)
Chloride: 104 mmol/L (ref 96–106)
Creatinine, Ser: 0.74 mg/dL (ref 0.57–1.00)
Globulin, Total: 2.7 g/dL (ref 1.5–4.5)
Glucose: 79 mg/dL (ref 70–99)
Potassium: 4.7 mmol/L (ref 3.5–5.2)
Sodium: 144 mmol/L (ref 134–144)
Total Protein: 6.9 g/dL (ref 6.0–8.5)
eGFR: 82 mL/min/{1.73_m2} (ref >59)

## 2021-10-23 LAB — MICROALBUMIN / CREATININE URINE RATIO
Creatinine, Urine: 26.9 mg/dL
Microalb/Creat Ratio: <11 mg/g creat (ref 0–29)
Microalbumin, Urine: <3 ug/mL

## 2021-10-23 NOTE — Progress Notes (Signed)
Hello Stephanie Gonzalez,  Your lab result is normal and/or stable.Some minor variations that are not significant are commonly marked abnormal, but do not represent any medical problem for you.  Best regards, Zong Mcquarrie, M.D.

## 2021-10-27 LAB — TOXASSURE SELECT 13 (MW), URINE

## 2021-11-13 ENCOUNTER — Other Ambulatory Visit: Payer: Self-pay | Admitting: Family Medicine

## 2021-11-13 DIAGNOSIS — Z1231 Encounter for screening mammogram for malignant neoplasm of breast: Secondary | ICD-10-CM

## 2021-12-04 ENCOUNTER — Ambulatory Visit: Payer: Medicare Other

## 2021-12-12 ENCOUNTER — Ambulatory Visit: Payer: Medicare Other | Admitting: Family Medicine

## 2021-12-13 ENCOUNTER — Encounter: Payer: Self-pay | Admitting: Family Medicine

## 2021-12-18 ENCOUNTER — Ambulatory Visit: Payer: Medicare Other

## 2021-12-19 ENCOUNTER — Ambulatory Visit
Admission: RE | Admit: 2021-12-19 | Discharge: 2021-12-19 | Disposition: A | Discharge status: Home / Self Care | Payer: Medicare Other | Source: Ambulatory Visit | Attending: Family Medicine | Admitting: Family Medicine

## 2021-12-19 DIAGNOSIS — Z1231 Encounter for screening mammogram for malignant neoplasm of breast: Secondary | ICD-10-CM

## 2022-02-09 IMAGING — MG MM DIGITAL SCREENING BILAT W/ TOMO AND CAD
8 series · 8 of 24 positions shown · non-contrast
Comparison: Previous exam(s).

CLINICAL DATA: Screening.

EXAM:
DIGITAL SCREENING BILATERAL MAMMOGRAM WITH TOMOSYNTHESIS AND CAD
TECHNIQUE: Bilateral screening digital craniocaudal and mediolateral oblique
mammograms were obtained. Bilateral screening digital breast
tomosynthesis was performed. The images were evaluated with
computer-aided detection.

[L CC synth-2D]
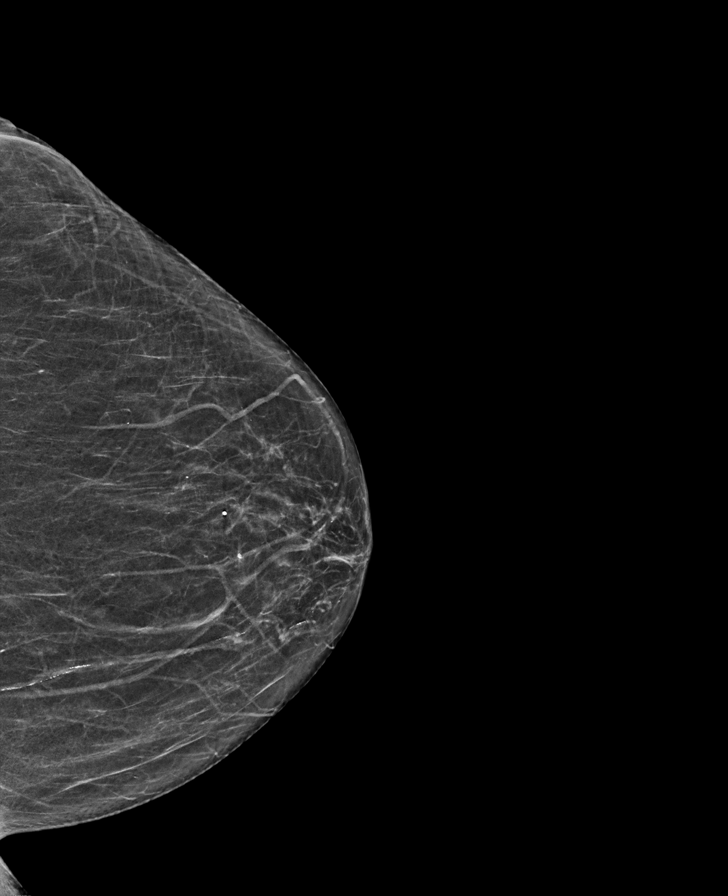

[L MLO synth-2D]
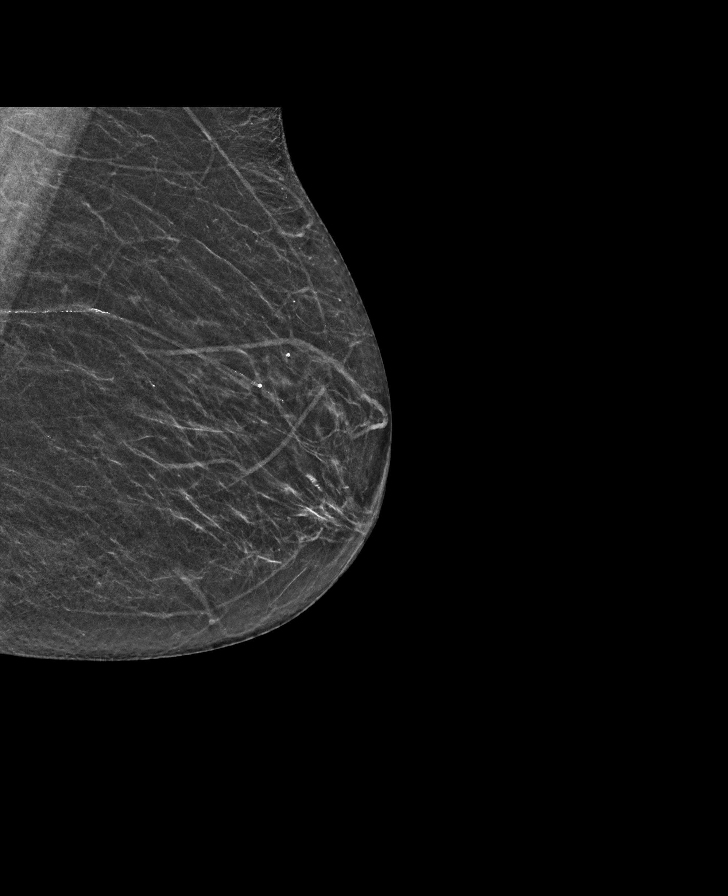

[R MLO synth-2D]
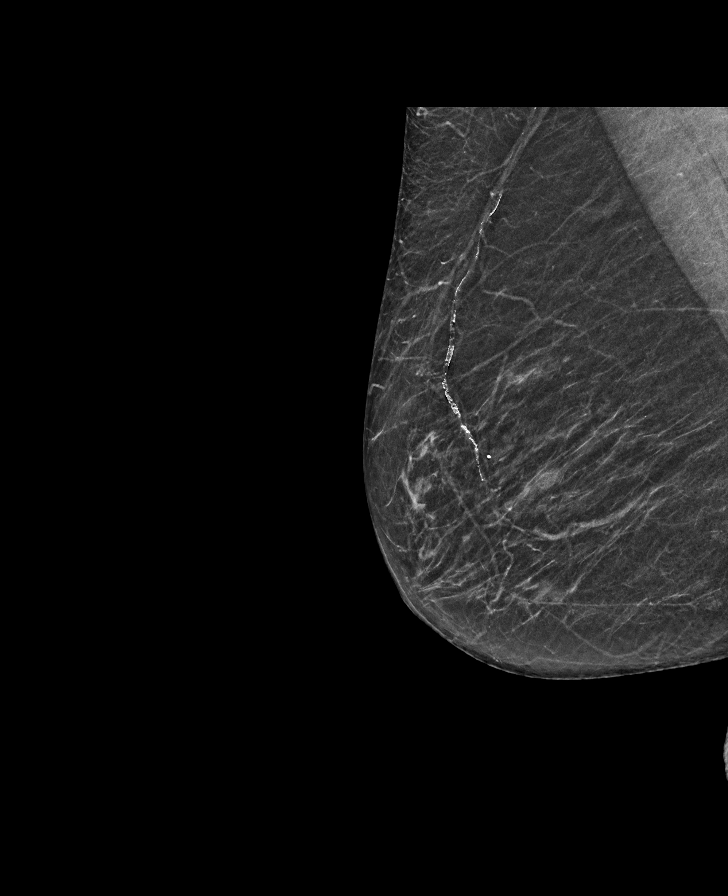

[R CC synth-2D]
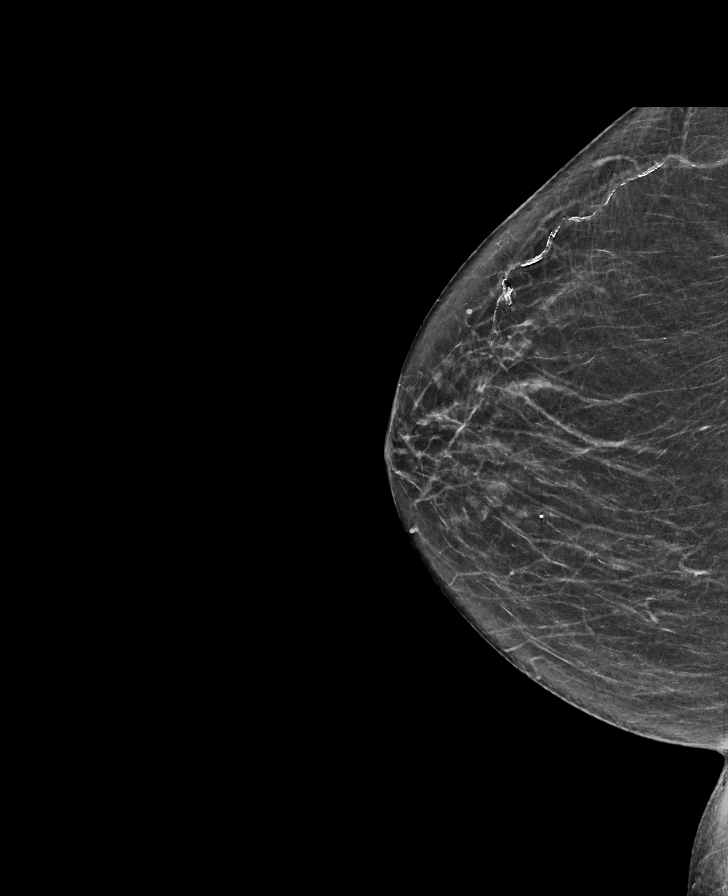

[R MLO tomo · tomo slice 29/56.0]
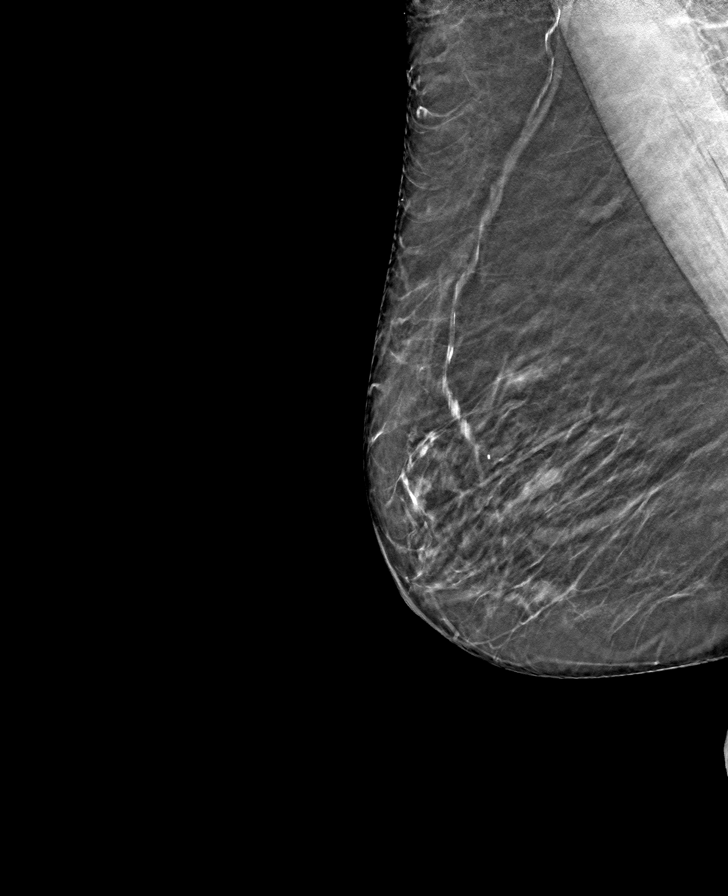

[R CC tomo · tomo slice 29/58.0]
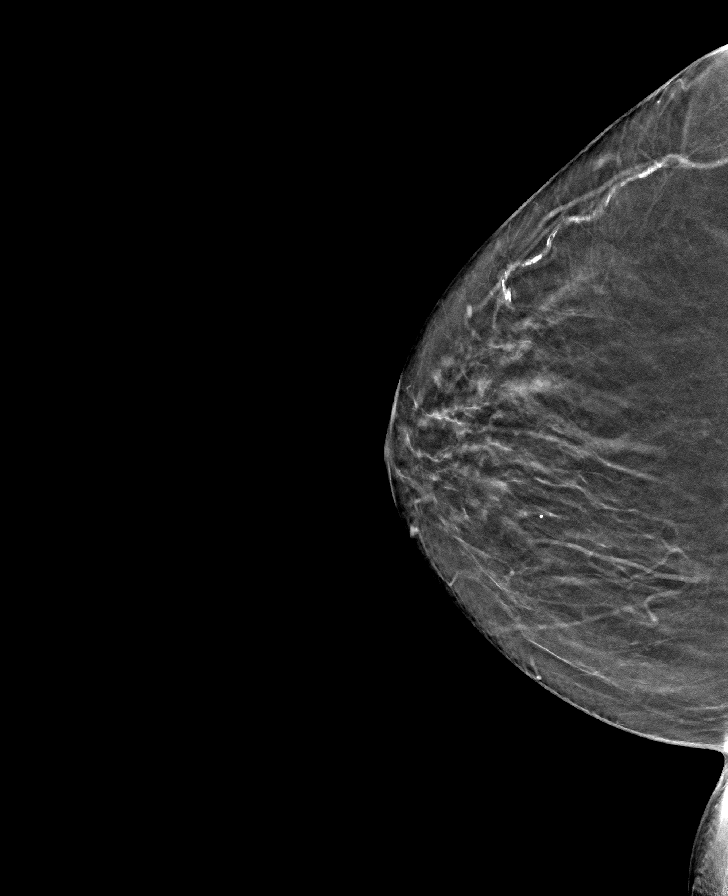

[L MLO tomo · tomo slice 26/51.0]
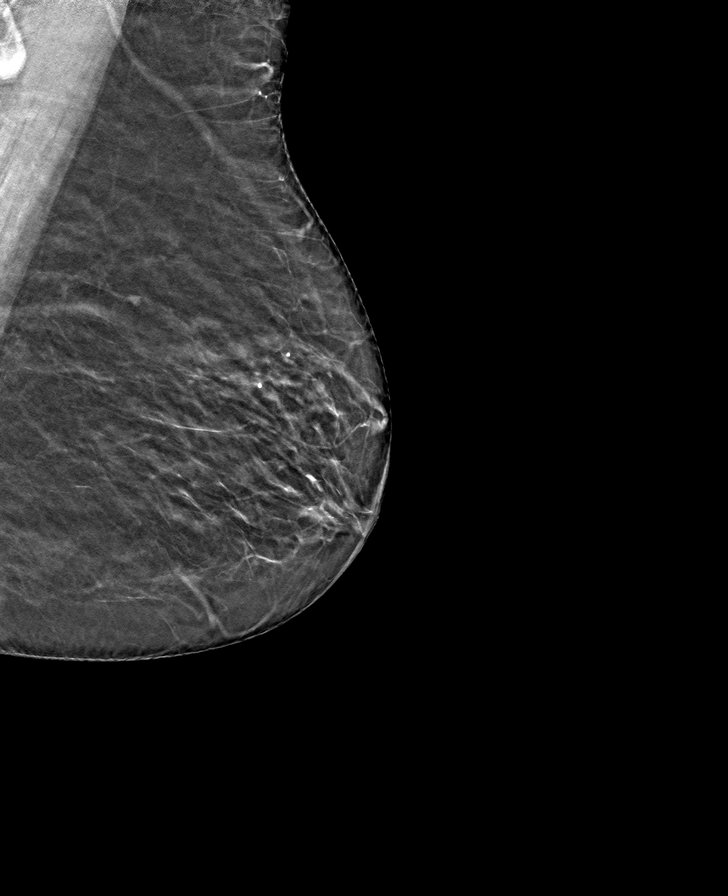

[L CC tomo · tomo slice 28/55.0]
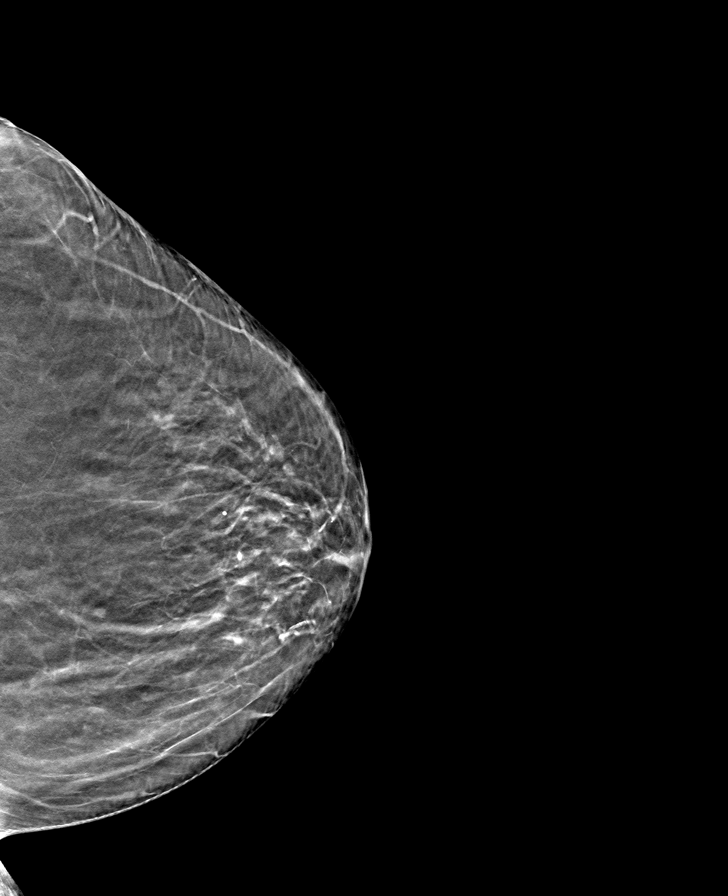

[8 of 24 positions shown; findings below may reference images not displayed]

ACR Breast Density Category b: There are scattered areas of
fibroglandular density.
FINDINGS: There are no findings suspicious for malignancy. The images were
evaluated with computer-aided detection.
IMPRESSION: No mammographic evidence of malignancy. A result letter of this
screening mammogram will be mailed directly to the patient.

RECOMMENDATION:
Screening mammogram in one year. (Code:WJ-[id number])

BI-RADS CATEGORY  1: Negative.

## 2022-02-27 ENCOUNTER — Observation Stay (HOSPITAL_BASED_OUTPATIENT_CLINIC_OR_DEPARTMENT_OTHER)
Admission: EM | Admit: 2022-02-27 | Discharge: 2022-02-28 | Disposition: A | Discharge status: Home / Self Care | Payer: Medicare Other | Attending: Internal Medicine | Admitting: Internal Medicine

## 2022-02-27 ENCOUNTER — Encounter (HOSPITAL_COMMUNITY): Payer: Self-pay

## 2022-02-27 ENCOUNTER — Other Ambulatory Visit: Payer: Self-pay

## 2022-02-27 ENCOUNTER — Encounter (HOSPITAL_BASED_OUTPATIENT_CLINIC_OR_DEPARTMENT_OTHER): Payer: Self-pay

## 2022-02-27 ENCOUNTER — Emergency Department (HOSPITAL_BASED_OUTPATIENT_CLINIC_OR_DEPARTMENT_OTHER): Payer: Medicare Other | Admitting: Radiology

## 2022-02-27 DIAGNOSIS — F419 Anxiety disorder, unspecified: Secondary | ICD-10-CM | POA: Insufficient documentation

## 2022-02-27 DIAGNOSIS — Z79899 Other long term (current) drug therapy: Secondary | ICD-10-CM | POA: Insufficient documentation

## 2022-02-27 DIAGNOSIS — R0609 Other forms of dyspnea: Secondary | ICD-10-CM | POA: Diagnosis present

## 2022-02-27 DIAGNOSIS — E119 Type 2 diabetes mellitus without complications: Secondary | ICD-10-CM | POA: Diagnosis not present

## 2022-02-27 DIAGNOSIS — J45909 Unspecified asthma, uncomplicated: Secondary | ICD-10-CM | POA: Insufficient documentation

## 2022-02-27 DIAGNOSIS — G8929 Other chronic pain: Secondary | ICD-10-CM

## 2022-02-27 DIAGNOSIS — I472 Ventricular tachycardia, unspecified: Secondary | ICD-10-CM | POA: Insufficient documentation

## 2022-02-27 DIAGNOSIS — I4891 Unspecified atrial fibrillation: Secondary | ICD-10-CM | POA: Diagnosis not present

## 2022-02-27 DIAGNOSIS — I10 Essential (primary) hypertension: Secondary | ICD-10-CM | POA: Diagnosis not present

## 2022-02-27 DIAGNOSIS — I480 Paroxysmal atrial fibrillation: Secondary | ICD-10-CM | POA: Diagnosis present

## 2022-02-27 LAB — BASIC METABOLIC PANEL
Anion gap: 11 (ref 5–15)
BUN: 14 mg/dL (ref 8–23)
CO2: 24 mmol/L (ref 22–32)
Calcium: 9.8 mg/dL (ref 8.9–10.3)
Chloride: 106 mmol/L (ref 98–111)
Creatinine, Ser: 0.84 mg/dL (ref 0.44–1.00)
GFR, Estimated: >60 mL/min (ref >60)
Glucose, Bld: 133 mg/dL — ABNORMAL HIGH (ref 70–99)
Potassium: 3.4 mmol/L — ABNORMAL LOW (ref 3.5–5.1)
Sodium: 141 mmol/L (ref 135–145)

## 2022-02-27 LAB — MRSA NEXT GEN BY PCR, NASAL: MRSA by PCR Next Gen: NOT DETECTED

## 2022-02-27 LAB — TSH: TSH: 1.112 u[IU]/mL (ref 0.350–4.500)

## 2022-02-27 LAB — MAGNESIUM: Magnesium: 2.1 mg/dL (ref 1.7–2.4)

## 2022-02-27 LAB — CBC
HCT: 41.4 % (ref 36.0–46.0)
Hemoglobin: 14 g/dL (ref 12.0–15.0)
MCH: 28.2 pg (ref 26.0–34.0)
MCHC: 33.8 g/dL (ref 30.0–36.0)
MCV: 83.5 fL (ref 80.0–100.0)
Platelets: 231 10*3/uL (ref 150–400)
RBC: 4.96 MIL/uL (ref 3.87–5.11)
RDW: 13 % (ref 11.5–15.5)
WBC: 10.5 10*3/uL (ref 4.0–10.5)
nRBC: 0 % (ref 0.0–0.2)

## 2022-02-27 LAB — TROPONIN I (HIGH SENSITIVITY)
Troponin I (High Sensitivity): 10 ng/L (ref <18)
Troponin I (High Sensitivity): 26 ng/L — ABNORMAL HIGH (ref <18)

## 2022-02-27 LAB — BRAIN NATRIURETIC PEPTIDE: B Natriuretic Peptide: 135.9 pg/mL — ABNORMAL HIGH (ref 0.0–100.0)

## 2022-02-27 MED ORDER — HYDROCODONE-ACETAMINOPHEN 10-325 MG PO TABS
0.5000 | ORAL_TABLET | Freq: Four times a day (QID) | ORAL | Status: DC | PRN
  Administered 2022-02-27 – 2022-02-28 (×2): 0.5 via ORAL
  Filled 2022-02-27 (×2): qty 1

## 2022-02-27 MED ORDER — POTASSIUM CHLORIDE CRYS ER 20 MEQ PO TBCR
60.0000 meq | EXTENDED_RELEASE_TABLET | Freq: Once | ORAL | Status: AC
  Administered 2022-02-27: 60 meq via ORAL
  Filled 2022-02-27: qty 3

## 2022-02-27 MED ORDER — DILTIAZEM HCL-DEXTROSE 125-5 MG/125ML-% IV SOLN (PREMIX)
5.0000 mg/h | INTRAVENOUS | Status: DC
  Administered 2022-02-27: 5 mg/h via INTRAVENOUS
  Filled 2022-02-27: qty 125

## 2022-02-27 MED ORDER — METOPROLOL TARTRATE 25 MG PO TABS
25.0000 mg | ORAL_TABLET | Freq: Two times a day (BID) | ORAL | Status: DC
  Administered 2022-02-27 – 2022-02-28 (×2): 25 mg via ORAL
  Filled 2022-02-27 (×2): qty 1

## 2022-02-27 MED ORDER — DILTIAZEM HCL-DEXTROSE 125-5 MG/125ML-% IV SOLN (PREMIX)
5.0000 mg/h | INTRAVENOUS | Status: DC
  Filled 2022-02-27: qty 125

## 2022-02-27 MED ORDER — DILTIAZEM HCL 25 MG/5ML IV SOLN
10.0000 mg | Freq: Once | INTRAVENOUS | Status: AC
  Administered 2022-02-27: 10 mg via INTRAVENOUS
  Filled 2022-02-27: qty 5

## 2022-02-27 MED ORDER — SODIUM CHLORIDE 0.9 % IV BOLUS
1000.0000 mL | Freq: Once | INTRAVENOUS | Status: AC
  Administered 2022-02-27: 1000 mL via INTRAVENOUS

## 2022-02-27 MED ORDER — ACETAMINOPHEN 325 MG PO TABS: 650.0000 mg | ORAL_TABLET | ORAL | Status: DC | PRN

## 2022-02-27 MED ORDER — SODIUM CHLORIDE 0.9 % IV SOLN: INTRAVENOUS | Status: DC

## 2022-02-27 MED ORDER — APIXABAN 5 MG PO TABS
5.0000 mg | ORAL_TABLET | Freq: Two times a day (BID) | ORAL | Status: DC
  Administered 2022-02-27 – 2022-02-28 (×2): 5 mg via ORAL
  Filled 2022-02-27: qty 1
  Filled 2022-02-27: qty 2

## 2022-02-27 MED ORDER — DILTIAZEM LOAD VIA INFUSION
10.0000 mg | Freq: Once | INTRAVENOUS | Status: AC
  Administered 2022-02-27: 10 mg via INTRAVENOUS
  Filled 2022-02-27: qty 10

## 2022-02-27 MED ORDER — CLONAZEPAM 0.5 MG PO TABS
1.0000 mg | ORAL_TABLET | Freq: Every day | ORAL | Status: DC
  Administered 2022-02-27: 1 mg via ORAL
  Filled 2022-02-27: qty 2

## 2022-02-27 NOTE — ED Triage Notes (Addendum)
Pt states that EMS came to her house and was told she was in a fib. Pt c/o SHOB. Pt states shoulder, jaw, chest pain.  Pt had 325 ASA prior to arrival

## 2022-02-27 NOTE — Progress Notes (Signed)
Patient arrived to [facility] via [facility]. [facility] RN notified this RN that patient converted back to Sinus rhythm/sinus brady during transportation and they turned her Cardizem drip off. Patient settled in and an EKG was obtained which showed Sinus Bradycardia, rate in 50's. EKG placed in patient's chart and showed it to Dr. Myna Hidalgo during his visit.

## 2022-02-27 NOTE — ED Notes (Signed)
Handoff report given to Risa Grill RN on 2C16  at [facility]

## 2022-02-27 NOTE — ED Notes (Signed)
Report given to [facility]. 

## 2022-02-27 NOTE — ED Notes (Signed)
Called [facility] to tx patient to [place] for direct admit nurse made aware

## 2022-02-27 NOTE — H&P (Signed)
History and Physical    Stephanie Gonzalez HYQ:[id number] DOB: [birth year] DOA: 02/27/2022  PCP: Claretta Fraise, MD   Patient coming from: Home   Chief Complaint: DOE, fatigue, neck and jaw pain   HPI: Stephanie Gonzalez is a very pleasant 81 y.o. female with medical history significant for hypertension and chronic back pain, presented to the emergency department with exertional dyspnea, fatigue, and neck and jaw pain.  Patient reports 3 to 4 weeks of exertional dyspnea and fatigue, and then developed an aching pain in her bilateral jaw and neck this morning.  She denies any palpitations or chest pain associated with this.  She denies any leg swelling.  She denies cough, fever, or chills.  [facility] ED Course: Upon arrival to the ED, patient is found to be afebrile and in atrial fibrillation with rate as high as 160s.  EKG features atrial fibrillation with RVR.  Chest x-ray is negative for acute cardiopulmonary disease.  BNP is 136 and troponin was 10 and then 26.    Patient was given a liter of saline, 60 mill equivalents oral potassium, Eliquis, 10 mg IV diltiazem x2, and was started on diltiazem infusion.  Cardiology was consulted by the ED physician and the patient was transferred to [facility] for admission.  Review of Systems:  All other systems reviewed and apart from HPI, are negative.  Past Medical History:  Diagnosis Date   Allergy    Arthritis    ASTHMA 10/06/2007   Bronchial asthma    BURSITIS, RIGHT HIP 02/04/2010   DIABETES MELLITUS, TYPE II 11/01/2009   Family history of adverse reaction to anesthesia    Mother had n/v   H/O vertigo    History of kidney stones    passed on her own   HYPERLIPIDEMIA 10/06/2007   HYPERTENSION 10/06/2007   Pneumonia    Restless leg syndrome     Past Surgical History:  Procedure Laterality Date   ABDOMINAL HYSTERECTOMY     APPENDECTOMY     CARDIAC CATHETERIZATION  1992   ? 90's   CHOLECYSTECTOMY     COLONOSCOPY     KNEE  ARTHROSCOPY Right    SPINAL CORD STIMULATOR INSERTION N/A 07/13/2014   Procedure: LUMBAR SPINAL CORD STIMULATOR INSERTION;  Surgeon: Melina Schools, MD;  Location: [facility];  Service: Orthopedics;  Laterality: N/A;   SPINE SURGERY     TONSILLECTOMY     TUBAL LIGATION      Social History:   reports that she has never smoked. She has never used smokeless tobacco. She reports that she does not drink alcohol and does not use drugs.  Allergies  Allergen Reactions   Other     Oranges; juice or nuts    Tetracycline Rash and Other (See Comments)    Agitation    Family History  Problem Relation Age of Onset   Alzheimer's disease Mother    Cancer Father    Alcoholism Father    Breast cancer Neg Hx      Prior to Admission medications   Medication Sig Start Date End Date Taking? Authorizing Provider  albuterol (VENTOLIN HFA) 108 (90 Base) MCG/ACT inhaler Inhale 2 puffs into the lungs every 6 (six) hours as needed for wheezing.    [provider]  amLODipine (NORVASC) 5 MG tablet Take 1 tablet (5 mg total) by mouth daily. 04/24/21   Claretta Fraise, MD  atorvastatin (LIPITOR) 40 MG tablet Take 1 tablet (40 mg total) by mouth daily. 04/24/21  Claretta Fraise, MD  b complex vitamins tablet Take 1 tablet by mouth daily.    [provider]  cetirizine (ZYRTEC) 10 MG tablet TAKE 1 TABLET BY MOUTH EVERYDAY AT BEDTIME 05/07/20   Claretta Fraise, MD  clonazePAM (KLONOPIN) 1 MG tablet Take 1 tablet (1 mg total) by mouth at bedtime. For restless legs 10/22/21   Claretta Fraise, MD  famotidine (PEPCID) 20 MG tablet Take 1 tablet (20 mg total) by mouth 2 (two) times daily. 08/10/20   Claretta Fraise, MD  fenofibrate 160 MG tablet TAKE 1 TABLET BY MOUTH  DAILY FOR CHOLESTEROL AND  TRIGLYCERIDE 10/02/21   Claretta Fraise, MD  glucose blood (FREESTYLE LITE) test strip USE TO CHECK BLOOD SUGAR DAILY AND PRN 05/15/14   Marletta Lor, MD  HYDROcodone-acetaminophen [facility]) 10-325 MG tablet Take 0.5  tablets by mouth 2 (two) times daily as needed for moderate pain. Patient taking differently: Take 0.5 tablets by mouth every 6 (six) hours as needed for moderate pain. 03/04/16   Marletta Lor, MD  Lancets (FREESTYLE) lancets USE TO CHECK BLOOD SUGAR DAILY AND PRN 08/10/20   Claretta Fraise, MD  LYSINE PO Take 1 tablet by mouth daily as needed (for cold sores).     [provider]  sertraline (ZOLOFT) 50 MG tablet Take 0.5-1 tablets (25-50 mg total) by mouth daily. 04/24/21   Claretta Fraise, MD    Physical Exam: Vitals:   02/27/22 1600 02/27/22 1814 02/27/22 1930 02/27/22 2104  BP: (!) 143/103  (!) 154/67 (!) 152/59  Pulse: 77  62 61  Resp: 19  15 20   Temp:  98.2 F (36.8 C)  98.4 F (36.9 C)  TempSrc:  Oral  Oral  SpO2: 96%  97% 95%  Weight:      Height:        Constitutional: NAD, calm  Eyes: PERTLA, lids and conjunctivae normal ENMT: Mucous membranes are moist. Posterior pharynx clear of any exudate or lesions.   Neck: supple, no masses  Respiratory: no wheezing, no crackles. No accessory muscle use.  Cardiovascular: S1 & S2 heard, regular rate and rhythm. No extremity edema.  Abdomen: No distension, no tenderness, soft. Bowel sounds active.  Musculoskeletal: no clubbing / cyanosis. No joint deformity upper and lower extremities.   Skin: no significant rashes, lesions, ulcers. Warm, dry, well-perfused. Neurologic: CN 2-12 grossly intact. Moving all extremities. Alert and oriented.  Psychiatric: Pleasant. Cooperative.    Labs and Imaging on Admission: I have personally reviewed following labs and imaging studies  CBC: Recent Labs  Lab 02/27/22 1358  WBC 10.5  HGB 14.0  HCT 41.4  MCV 83.5  PLT [id number]   Basic Metabolic Panel: Recent Labs  Lab 02/27/22 1358 02/27/22 1410  NA 141  --   K 3.4*  --   CL 106  --   CO2 24  --   GLUCOSE 133*  --   BUN 14  --   CREATININE 0.84  --   CALCIUM 9.8  --   MG  --  2.1   GFR: Estimated Creatinine Clearance:  55 mL/min (by C-G formula based on SCr of 0.84 mg/dL). Liver Function Tests: No results for input(s): "AST", "ALT", "ALKPHOS", "BILITOT", "PROT", "ALBUMIN" in the last 168 hours. No results for input(s): "LIPASE", "AMYLASE" in the last 168 hours. No results for input(s): "AMMONIA" in the last 168 hours. Coagulation Profile: No results for input(s): "INR", "PROTIME" in the last 168 hours. Cardiac Enzymes: No results for input(s): "CKTOTAL", "  CKMB", "CKMBINDEX", "TROPONINI" in the last 168 hours. BNP (last 3 results) No results for input(s): "PROBNP" in the last 8760 hours. HbA1C: No results for input(s): "HGBA1C" in the last 72 hours. CBG: No results for input(s): "GLUCAP" in the last 168 hours. Lipid Profile: No results for input(s): "CHOL", "HDL", "LDLCALC", "TRIG", "CHOLHDL", "LDLDIRECT" in the last 72 hours. Thyroid Function Tests: Recent Labs    02/27/22 1411  TSH 1.112   Anemia Panel: No results for input(s): "VITAMINB12", "FOLATE", "FERRITIN", "TIBC", "IRON", "RETICCTPCT" in the last 72 hours. Urine analysis:    Component Value Date/Time   COLORURINE YELLOW 09/22/2010 [place] 09/22/2010 1134   LABSPEC 1.007 09/22/2010 1134   PHURINE 6.0 09/22/2010 1134   GLUCOSEU NEGATIVE 09/22/2010 1134   HGBUR NEGATIVE 09/22/2010 1134   BILIRUBINUR n 07/01/2016 1041   KETONESUR NEGATIVE 09/22/2010 1134   PROTEINUR n 07/01/2016 1041   PROTEINUR NEGATIVE 09/22/2010 1134   UROBILINOGEN 0.2 07/01/2016 1041   UROBILINOGEN 0.2 09/22/2010 1134   NITRITE n 07/01/2016 1041   NITRITE NEGATIVE 09/22/2010 1134   LEUKOCYTESUR Trace (A) 07/01/2016 1041   Sepsis Labs: @LABRCNTIP (procalcitonin:4,lacticidven:4) )No results found for this or any previous visit (from the past 240 hour(s)).   Radiological Exams on Admission: DG Chest Port 1 View  Result Date: 02/27/2022 CLINICAL DATA:  Shortness of breath. EXAM: PORTABLE CHEST 1 VIEW COMPARISON:  December 24, 2016. FINDINGS: The  heart size and mediastinal contours are within normal limits. Both lungs are clear. The visualized skeletal structures are unremarkable. IMPRESSION: No active disease. Electronically Signed   By: Marijo Conception M.D.   On: 02/27/2022 14:07    EKG: Independently reviewed. Atrial fibrillation with RVR, rate 168.   Assessment/Plan   1. New-onset atrial fibrillation with RVR  - Presents with b/l jaw and neck discomfort after 3-4 wks of DOE and fatigue and is found to be in rapid atrial fibrillation  - She was given 10 mg IV diltiazem x2 and started on diltiazem infusion and Eliquis in ED  - She is in SR on arrival to [facility]  - In terms of etiology, TSH is normal, she does not drink alcohol, has not had echo  - Plan to continue Eliquis, start metoprolol, check echocardiogram    2. Hypertension  - She is prescribed Norvasc but has not been taking  - Starting metoprolol as above, hold Norvasc    3. Chronic back pain  - Prescription database reviewed, plan to continue home regimen    DVT prophylaxis: Eliquis  Code Status: Full  Level of Care: Level of care: Telemetry Cardiac Family Communication: husband and daughter at bedside  Disposition Plan:  Patient is from: home  Anticipated d/c is to: Home  Anticipated d/c date is: 9/22 or 03/01/22  Patient currently: pending echocardiogram, cardiology consultation  Consults called: cardiology  Admission status: Observation     Vianne Bulls, MD [facility]  02/27/2022, 10:01 PM

## 2022-02-27 NOTE — ED Notes (Signed)
Pt states that she needs to use the restroom. Delay in EKG and vitals.

## 2022-02-27 NOTE — ED Provider Notes (Signed)
[facility] EMERGENCY DEPT Provider Note   CSN: [id number] Arrival date & time: 02/27/22  1332     History  Chief Complaint  Patient presents with   Atrial Fibrillation    Stephanie Gonzalez is a 81 y.o. female.   Atrial Fibrillation     Patient has history of asthma diabetes hyperlipidemia hypertension status post appendectomy cholecystectomy hysterectomy who presents to the ED for evaluation of irregular heartbeat.  Patient states she has noticed for the last several days she has been having trouble with feeling more fatigued.  She has been feeling somewhat short of breath.  This morning she started having some discomfort in her chest and her jaw.  She also felt short of breath.  She called EMS and they noted that she was in atrial fibrillation.  Patient was given aspirin prior to arrival.  Patient has not noticed that her heart is racing.  She does not have any history of irregular heartbeat.  Home Medications Prior to Admission medications   Medication Sig Start Date End Date Taking? Authorizing Provider  albuterol (VENTOLIN HFA) 108 (90 Base) MCG/ACT inhaler Inhale 2 puffs into the lungs every 6 (six) hours as needed for wheezing.    [provider]  amLODipine (NORVASC) 5 MG tablet Take 1 tablet (5 mg total) by mouth daily. 04/24/21   Claretta Fraise, MD  atorvastatin (LIPITOR) 40 MG tablet Take 1 tablet (40 mg total) by mouth daily. 04/24/21   Claretta Fraise, MD  b complex vitamins tablet Take 1 tablet by mouth daily.    [provider]  cetirizine (ZYRTEC) 10 MG tablet TAKE 1 TABLET BY MOUTH EVERYDAY AT BEDTIME 05/07/20   Claretta Fraise, MD  clonazePAM (KLONOPIN) 1 MG tablet Take 1 tablet (1 mg total) by mouth at bedtime. For restless legs 10/22/21   Claretta Fraise, MD  famotidine (PEPCID) 20 MG tablet Take 1 tablet (20 mg total) by mouth 2 (two) times daily. 08/10/20   Claretta Fraise, MD  fenofibrate 160 MG tablet TAKE 1 TABLET BY MOUTH  DAILY FOR  CHOLESTEROL AND  TRIGLYCERIDE 10/02/21   Claretta Fraise, MD  glucose blood (FREESTYLE LITE) test strip USE TO CHECK BLOOD SUGAR DAILY AND PRN 05/15/14   Marletta Lor, MD  HYDROcodone-acetaminophen [facility]) 10-325 MG tablet Take 0.5 tablets by mouth 2 (two) times daily as needed for moderate pain. Patient taking differently: Take 0.5 tablets by mouth every 6 (six) hours as needed for moderate pain. 03/04/16   Marletta Lor, MD  Lancets (FREESTYLE) lancets USE TO CHECK BLOOD SUGAR DAILY AND PRN 08/10/20   Claretta Fraise, MD  LYSINE PO Take 1 tablet by mouth daily as needed (for cold sores).     [provider]  sertraline (ZOLOFT) 50 MG tablet Take 0.5-1 tablets (25-50 mg total) by mouth daily. 04/24/21   Claretta Fraise, MD      Allergies    Other and Tetracycline    Review of Systems   Review of Systems  Physical Exam Updated Vital Signs BP (!) 143/103   Pulse 77   Temp 99 F (37.2 C) (Oral)   Resp 19   Ht 1.651 m (5\' 5" )   Wt 74.8 kg   SpO2 96%   BMI 27.46 kg/m  Physical Exam Vitals and nursing note reviewed.  Constitutional:      General: She is not in acute distress.    Appearance: She is well-developed.  HENT:     Head: Normocephalic and atraumatic.  Right Ear: External ear normal.     Left Ear: External ear normal.  Eyes:     General: No scleral icterus.       Right eye: No discharge.        Left eye: No discharge.     Conjunctiva/sclera: Conjunctivae normal.  Neck:     Trachea: No tracheal deviation.  Cardiovascular:     Rate and Rhythm: Tachycardia present. Rhythm irregular.  Pulmonary:     Effort: Pulmonary effort is normal. No respiratory distress.     Breath sounds: Normal breath sounds. No stridor. No wheezing or rales.  Abdominal:     General: Bowel sounds are normal. There is no distension.     Palpations: Abdomen is soft.     Tenderness: There is no abdominal tenderness. There is no guarding or rebound.  Musculoskeletal:         General: No tenderness or deformity.     Cervical back: Neck supple.  Skin:    General: Skin is warm and dry.     Findings: No rash.  Neurological:     General: No focal deficit present.     Mental Status: She is alert.     Cranial Nerves: No cranial nerve deficit (no facial droop, extraocular movements intact, no slurred speech).     Sensory: No sensory deficit.     Motor: No abnormal muscle tone or seizure activity.     Coordination: Coordination normal.  Psychiatric:        Mood and Affect: Mood normal.     ED Results / Procedures / Treatments   Labs (all labs ordered are listed, but only abnormal results are displayed) Labs Reviewed  BASIC METABOLIC PANEL - Abnormal; Notable for the following components:      Result Value   Potassium 3.4 (*)    Glucose, Bld 133 (*)    All other components within normal limits  BRAIN NATRIURETIC PEPTIDE - Abnormal; Notable for the following components:   B Natriuretic Peptide 135.9 (*)    All other components within normal limits  CBC  MAGNESIUM  TSH  TROPONIN I (HIGH SENSITIVITY)  TROPONIN I (HIGH SENSITIVITY)    EKG EKG Interpretation  Date/Time:  Thursday February 27 2022 14:28:21 EDT Ventricular Rate:  130 PR Interval:    QRS Duration: 129 QT Interval:  384 QTC Calculation: 565 R Axis:   -75 Text Interpretation: Atrial fibrillation Left bundle branch block Since last tracing rate slower Confirmed by Dorie Rank [phone]) on 02/27/2022 3:10:49 PM  Radiology DG Chest Port 1 View  Result Date: 02/27/2022 CLINICAL DATA:  Shortness of breath. EXAM: PORTABLE CHEST 1 VIEW COMPARISON:  December 24, 2016. FINDINGS: The heart size and mediastinal contours are within normal limits. Both lungs are clear. The visualized skeletal structures are unremarkable. IMPRESSION: No active disease. Electronically Signed   By: Marijo Conception M.D.   On: 02/27/2022 14:07    Procedures .Critical Care  Performed by: Dorie Rank, MD Authorized by: Dorie Rank, MD   Critical care provider statement:    Critical care time (minutes):  30   Critical care was time spent personally by me on the following activities:  Development of treatment plan with patient or surrogate, discussions with consultants, evaluation of patient's response to treatment, examination of patient, ordering and review of laboratory studies, ordering and review of radiographic studies, ordering and performing treatments and interventions, pulse oximetry, re-evaluation of patient's condition and review of old charts  Medications Ordered in ED Medications  sodium chloride 0.9 % bolus 1,000 mL (1,000 mLs Intravenous New Bag/Given 02/27/22 1424)    And  0.9 %  sodium chloride infusion ( Intravenous New Bag/Given 02/27/22 1602)  diltiazem (CARDIZEM) 1 mg/mL load via infusion 10 mg (10 mg Intravenous Bolus from Bag 02/27/22 1535)    And  diltiazem (CARDIZEM) 125 mg in dextrose 5% 125 mL (1 mg/mL) infusion (5 mg/hr Intravenous New Bag/Given 02/27/22 1535)  apixaban (ELIQUIS) tablet 5 mg (has no administration in time range)  diltiazem (CARDIZEM) injection 10 mg (10 mg Intravenous Given 02/27/22 1424)    ED Course/ Medical Decision Making/ A&P Clinical Course as of 02/27/22 1616  Thu Feb 27, 2022  1432 Heart rate improved after Cardizem.  Rate now down in the 100s 110s [JK]  1510 Brain natriuretic peptide (IF shortness of breath has been documented this visit)(!) BNP slightly increased at 135 [JK]  1510 TSH [JK]  1510 Normal [JK]  [id number] Basic metabolic panel(!) Normal [JK]  1510 CBC Normal [JK]  1519 Heart rate increasing again.  We will give an additional dose of Cardizem and started on infusion [JK]  1521 Chest x-ray without acute findings [JK]  1603 Case discussed with branch cardiology.  Cardiology will consult.  She recommends medical admission [JK]    Clinical Course User Index [JK] Dorie Rank, MD       CHA2DS2-VASc Score: 5                    Medical Decision  Making Differential diagnosis includes but not limited to atrial fibrillation, acute coronary syndrome, pneumonia, myocarditis  Problems Addressed: Atrial fibrillation with tachycardic ventricular rate ([place]): acute illness or injury that poses a threat to life or bodily functions  Amount and/or Complexity of Data Reviewed Labs: ordered. Decision-making details documented in ED Course. Radiology: ordered and independent interpretation performed. Discussion of management or test interpretation with external provider(s): Case discussed with Dr. Harl Bowie cardiology  Risk Prescription drug management. Drug therapy requiring intensive monitoring for toxicity. Decision regarding hospitalization.   Patient presented with complaints of chest discomfort fatigue and noted to be in a tachycardic rhythm when EMS arrived.  Patient's EKG is consistent with atrial fibrillation.  She has been treated with Cardizem with some improvement in her heart rate but she remains tachycardic.  IV Cardizem infusion started.  No signs of cardiac ischemia.  No pulmonary edema.  Patient however would benefit from admission to the hospital for rate control further cardiac evaluation.        Final Clinical Impression(s) / ED Diagnoses Final diagnoses:  Atrial fibrillation with tachycardic ventricular rate [facility])    Rx / DC Orders ED Discharge Orders          Ordered    Amb referral to AFIB Clinic        02/27/22 1410              Dorie Rank, MD 02/27/22 1616

## 2022-02-27 NOTE — Plan of Care (Signed)
Transferred from [place]  Stephanie Gonzalez 81 year old with pmh of HTN, HLD, s/p appendectomy who presented for chest discomfort found to be in atrial fibrillation with RVR with heart rates in the 160s.  Patient started on Cardizem drip with improvement in heart rates down into the 80s still in atrial fibrillation.  Labs noted potassium 3.4, BNP 135.9, and high-sensitivity troponins negative.  Dr. Harl Bowie of cardiology have been consulted.  Cardiology upon patient's arrival.  Accepted to cardiac telemetry bed.

## 2022-02-27 NOTE — Consult Note (Signed)
Cardiology Consultation:   Patient ID: Stephanie Gonzalez MRN: [id number]; DOB: 26-Mar-1941  Admit date: 02/27/2022 Date of Consult: 02/27/2022  Primary Care Provider: Claretta Fraise, MD Primary Cardiologist: None  Primary Electrophysiologist:  None    Patient Profile:   Stephanie Gonzalez is a 81 y.o. female with a hx of HTN, prior back surgeries and chronic pain who is being seen today for the evaluation of afib at the request of emergency dept.  History of Present Illness:   Stephanie Gonzalez is a 81 yo female with hx HTN and chronic back pain who presented with jaw and arm pain in the setting of afib with RVR. Noted that she had been experiencing excessive fatigue and DOE over the last 2 months.   On presentation she had HR 160s. Trop 10-> 26 and BNP 136. Started on dilt and reverted to sinus rhythm by arrival to [place]. Now without symptoms.   Past Medical History:  Diagnosis Date   Allergy    Arthritis    ASTHMA 10/06/2007   Bronchial asthma    BURSITIS, RIGHT HIP 02/04/2010   DIABETES MELLITUS, TYPE II 11/01/2009   Family history of adverse reaction to anesthesia    Mother had n/v   H/O vertigo    History of kidney stones    passed on her own   HYPERLIPIDEMIA 10/06/2007   HYPERTENSION 10/06/2007   Pneumonia    Restless leg syndrome     Past Surgical History:  Procedure Laterality Date   ABDOMINAL HYSTERECTOMY     APPENDECTOMY     CARDIAC CATHETERIZATION  1992   ? 90's   CHOLECYSTECTOMY     COLONOSCOPY     KNEE ARTHROSCOPY Right    SPINAL CORD STIMULATOR INSERTION N/A 07/13/2014   Procedure: LUMBAR SPINAL CORD STIMULATOR INSERTION;  Surgeon: Melina Schools, MD;  Location: [facility];  Service: Orthopedics;  Laterality: N/A;   SPINE SURGERY     TONSILLECTOMY     TUBAL LIGATION       Home Medications:  Prior to Admission medications   Medication Sig Start Date End Date Taking? Authorizing Provider  albuterol (VENTOLIN HFA) 108 (90 Base) MCG/ACT inhaler Inhale 2 puffs into the  lungs every 6 (six) hours as needed for wheezing.    [provider]  amLODipine (NORVASC) 5 MG tablet Take 1 tablet (5 mg total) by mouth daily. 04/24/21   Claretta Fraise, MD  atorvastatin (LIPITOR) 40 MG tablet Take 1 tablet (40 mg total) by mouth daily. 04/24/21   Claretta Fraise, MD  b complex vitamins tablet Take 1 tablet by mouth daily.    [provider]  cetirizine (ZYRTEC) 10 MG tablet TAKE 1 TABLET BY MOUTH EVERYDAY AT BEDTIME 05/07/20   Claretta Fraise, MD  clonazePAM (KLONOPIN) 1 MG tablet Take 1 tablet (1 mg total) by mouth at bedtime. For restless legs 10/22/21   Claretta Fraise, MD  famotidine (PEPCID) 20 MG tablet Take 1 tablet (20 mg total) by mouth 2 (two) times daily. 08/10/20   Claretta Fraise, MD  fenofibrate 160 MG tablet TAKE 1 TABLET BY MOUTH  DAILY FOR CHOLESTEROL AND  TRIGLYCERIDE 10/02/21   Claretta Fraise, MD  glucose blood (FREESTYLE LITE) test strip USE TO CHECK BLOOD SUGAR DAILY AND PRN 05/15/14   Marletta Lor, MD  HYDROcodone-acetaminophen [facility]) 10-325 MG tablet Take 0.5 tablets by mouth 2 (two) times daily as needed for moderate pain. Patient taking differently: Take 0.5 tablets by mouth every 6 (six) hours as  needed for moderate pain. 03/04/16   Marletta Lor, MD  Lancets (FREESTYLE) lancets USE TO CHECK BLOOD SUGAR DAILY AND PRN 08/10/20   Claretta Fraise, MD  LYSINE PO Take 1 tablet by mouth daily as needed (for cold sores).     [provider]  sertraline (ZOLOFT) 50 MG tablet Take 0.5-1 tablets (25-50 mg total) by mouth daily. 04/24/21   Claretta Fraise, MD    Inpatient Medications: Scheduled Meds:  apixaban  5 mg Oral BID   Continuous Infusions:  sodium chloride 125 mL/hr at 02/27/22 1602   diltiazem (CARDIZEM) infusion 5 mg/hr (02/27/22 1535)   PRN Meds:   Allergies:    Allergies  Allergen Reactions   Other     Oranges; juice or nuts    Tetracycline Rash and Other (See Comments)    Agitation    Social History:    Social History   Socioeconomic History   Marital status: Married    Spouse name: Stephanie Gonzalez   Number of children: 3   Years of education: 13   Highest education level: Some college, no degree  Occupational History    Employer: RETIRED  Tobacco Use   Smoking status: Never   Smokeless tobacco: Never  Vaping Use   Vaping Use: Never used  Substance and Sexual Activity   Alcohol use: No   Drug use: No   Sexual activity: Not Currently    Birth control/protection: Surgical  Other Topics Concern   Not on file  Social History Narrative   Not on file   Social Determinants of Health   Financial Resource Strain: Low Risk  (05/10/2021)   Overall Financial Resource Strain (CARDIA)    Difficulty of Paying Living Expenses: Not hard at all  Food Insecurity: No Food Insecurity (05/10/2021)   Hunger Vital Sign    Worried About Running Out of Food in the Last Year: Never true    Ran Out of Food in the Last Year: Never true  Transportation Needs: No Transportation Needs (05/10/2021)   PRAPARE - Hydrologist (Medical): No    Lack of Transportation (Non-Medical): No  Physical Activity: Insufficiently Active (05/10/2021)   Exercise Vital Sign    Days of Exercise per Week: 7 days    Minutes of Exercise per Session: 20 min  Stress: No Stress Concern Present (05/10/2021)   [place]    Feeling of Stress : Not at all  Social Connections: [place] (05/10/2021)   Social Connection and Isolation Panel [NHANES]    Frequency of Communication with Friends and Family: More than three times a week    Frequency of Social Gatherings with Friends and Family: More than three times a week    Attends Religious Services: More than 4 times per year    Active Member of [employer] or Organizations: Yes    Attends Music therapist: More than 4 times per year    Marital Status: Married  Human resources officer  Violence: Not At Risk (05/10/2021)   Humiliation, Afraid, Rape, and Kick questionnaire    Fear of Current or Ex-Partner: No    Emotionally Abused: No    Physically Abused: No    Sexually Abused: No    Family History:    Family History  Problem Relation Age of Onset   Alzheimer's disease Mother    Cancer Father    Alcoholism Father    Breast cancer Neg Hx  Review of Systems: [y] = yes, [ ]  = no    General: Weight gain [ ] ; Weight loss [ ] ; Anorexia [ ] ; Fatigue [ ] ; Fever [ ] ; Chills [ ] ; Weakness [ ]   Cardiac: Chest pain/pressure [ ] ; Resting SOB [ ] ; Exertional SOB [ ] ; Orthopnea [ ] ; Pedal Edema [ ] ; Palpitations [ ] ; Syncope [ ] ; Presyncope [ ] ; Paroxysmal nocturnal dyspnea[ ]   Pulmonary: Cough [ ] ; Wheezing[ ] ; Hemoptysis[ ] ; Sputum [ ] ; Snoring [ ]   GI: Vomiting[ ] ; Dysphagia[ ] ; Melena[ ] ; Hematochezia [ ] ; Heartburn[ ] ; Abdominal pain [ ] ; Constipation [ ] ; Diarrhea [ ] ; BRBPR [ ]   GU: Hematuria[ ] ; Dysuria [ ] ; Nocturia[ ]   Vascular: Pain in legs with walking [ ] ; Pain in feet with lying flat [ ] ; Non-healing sores [ ] ; Stroke [ ] ; TIA [ ] ; Slurred speech [ ] ;  Neuro: Headaches[ ] ; Vertigo[ ] ; Seizures[ ] ; Paresthesias[ ] ;Blurred vision [ ] ; Diplopia [ ] ; Vision changes [ ]   Ortho/Skin: Arthritis [ ] ; Joint pain [ ] ; Muscle pain [ ] ; Joint swelling [ ] ; Back Pain [ ] ; Rash [ ]   Psych: Depression[ ] ; Anxiety[ ]   Heme: Bleeding problems [ ] ; Clotting disorders [ ] ; Anemia [ ]   Endocrine: Diabetes [ ] ; Thyroid dysfunction[ ]   Physical Exam/Data:   Vitals:   02/27/22 1530 02/27/22 1600 02/27/22 1814 02/27/22 1930  BP: (!) 139/112 (!) 143/103  (!) 154/67  Pulse: (!) 114 77  62  Resp: (!) 23 19  15   Temp:   98.2 F (36.8 C)   TempSrc:   Oral   SpO2: 94% 96%  97%  Weight:      Height:        Intake/Output Summary (Last 24 hours) at 02/27/2022 2019 Last data filed at 02/27/2022 1527 Gross per 24 hour  Intake --  Output 275 ml  Net -275 ml   Filed Weights    02/27/22 1353  Weight: 74.8 kg   Body mass index is 27.46 kg/m.  General:  Well nourished, well developed, in no acute distress HEENT: normal Lymph: no adenopathy Neck: no JVD Endocrine:  No thryomegaly Vascular: No carotid bruits; FA pulses 2+ bilaterally without bruits  Cardiac:  normal S1, S2; RRR; soft sytolic murmur Lungs:  clear to auscultation bilaterally, no wheezing, rhonchi or rales  Abd: soft, nontender, no hepatomegaly  Ext: no edema Musculoskeletal:  No deformities, BUE and BLE strength normal and equal Skin: warm and dry  Neuro:  CNs 2-12 intact, no focal abnormalities noted Psych:  Normal affect   EKG:  The EKG was personally reviewed and demonstrates:  afib with RVR at ED Telemetry:  Telemetry was personally reviewed and demonstrates:  NSR here   Relevant CV Studies: none  Laboratory Data:  Chemistry Recent Labs  Lab 02/27/22 1358  NA 141  K 3.4*  CL 106  CO2 24  GLUCOSE 133*  BUN 14  CREATININE 0.84  CALCIUM 9.8  GFRNONAA >60  ANIONGAP 11    No results for input(s): "PROT", "ALBUMIN", "AST", "ALT", "ALKPHOS", "BILITOT" in the last 168 hours. Hematology Recent Labs  Lab 02/27/22 1358  WBC 10.5  RBC 4.96  HGB 14.0  HCT 41.4  MCV 83.5  MCH 28.2  MCHC 33.8  RDW 13.0  PLT 231   Cardiac EnzymesNo results for input(s): "TROPONINI" in the last 168 hours. No results for input(s): "TROPIPOC" in the last 168 hours.  BNP Recent Labs  Lab 02/27/22 1410  BNP 135.9*    DDimer No results for input(s): "DDIMER" in the last 168 hours.  Radiology/Studies:  DG Chest Port 1 View  Result Date: 02/27/2022 CLINICAL DATA:  Shortness of breath. EXAM: PORTABLE CHEST 1 VIEW COMPARISON:  December 24, 2016. FINDINGS: The heart size and mediastinal contours are within normal limits. Both lungs are clear. The visualized skeletal structures are unremarkable. IMPRESSION: No active disease. Electronically Signed   By: Marijo Conception M.D.   On: 02/27/2022 14:07     Assessment and Plan:   # Atrial fib with RVR CHA2DS2-VASc 2 score of 5.  Unclear trigger for her atrial fibrillation presented with symptomatic A-fib with RVR that responded well to AV nodal therapy.  Would transition to oral therapy and start on anticoagulation with apixaban.  Could do oral diltiazem or metoprolol.  Recommend ordering an echo in the morning just to be sure there are no structural abnormalities or valvular heart disease leading to atrial fibrillation.      For questions or updates, please contact [place] Please consult [email or web address] for contact info under     Signed, Doyne Keel, MD  02/27/2022 8:19 PM

## 2022-02-28 ENCOUNTER — Observation Stay (HOSPITAL_COMMUNITY): Payer: Medicare Other

## 2022-02-28 ENCOUNTER — Other Ambulatory Visit (HOSPITAL_COMMUNITY): Payer: Self-pay

## 2022-02-28 DIAGNOSIS — I472 Ventricular tachycardia, unspecified: Secondary | ICD-10-CM | POA: Diagnosis not present

## 2022-02-28 DIAGNOSIS — G8929 Other chronic pain: Secondary | ICD-10-CM | POA: Diagnosis not present

## 2022-02-28 DIAGNOSIS — Z79899 Other long term (current) drug therapy: Secondary | ICD-10-CM | POA: Diagnosis not present

## 2022-02-28 DIAGNOSIS — I4891 Unspecified atrial fibrillation: Secondary | ICD-10-CM | POA: Diagnosis not present

## 2022-02-28 DIAGNOSIS — I10 Essential (primary) hypertension: Secondary | ICD-10-CM | POA: Diagnosis not present

## 2022-02-28 LAB — ECHOCARDIOGRAM COMPLETE
AR max vel: 2.5 cm2
AV Peak grad: 11.2 mmHg
Ao pk vel: 1.68 m/s
Area-P 1/2: 1.67 cm2
Height: 65 in
S' Lateral: 2.8 cm
Weight: 2698.43 oz

## 2022-02-28 LAB — CBC
HCT: 36.8 % (ref 36.0–46.0)
Hemoglobin: 12 g/dL (ref 12.0–15.0)
MCH: 28.4 pg (ref 26.0–34.0)
MCHC: 32.6 g/dL (ref 30.0–36.0)
MCV: 87 fL (ref 80.0–100.0)
Platelets: 205 10*3/uL (ref 150–400)
RBC: 4.23 MIL/uL (ref 3.87–5.11)
RDW: 13.3 % (ref 11.5–15.5)
WBC: 9.3 10*3/uL (ref 4.0–10.5)
nRBC: 0 % (ref 0.0–0.2)

## 2022-02-28 LAB — BASIC METABOLIC PANEL
Anion gap: 3 — ABNORMAL LOW (ref 5–15)
BUN: 13 mg/dL (ref 8–23)
CO2: 27 mmol/L (ref 22–32)
Calcium: 8.8 mg/dL — ABNORMAL LOW (ref 8.9–10.3)
Chloride: 112 mmol/L — ABNORMAL HIGH (ref 98–111)
Creatinine, Ser: 0.76 mg/dL (ref 0.44–1.00)
GFR, Estimated: >60 mL/min (ref >60)
Glucose, Bld: 109 mg/dL — ABNORMAL HIGH (ref 70–99)
Potassium: 4.4 mmol/L (ref 3.5–5.1)
Sodium: 142 mmol/L (ref 135–145)

## 2022-02-28 MED ORDER — APIXABAN 5 MG PO TABS
5.0000 mg | ORAL_TABLET | Freq: Two times a day (BID) | ORAL | 0 refills | Status: DC
  Filled 2022-02-28: qty 60, 30d supply, fill #0

## 2022-02-28 MED ORDER — METOPROLOL TARTRATE 25 MG PO TABS
25.0000 mg | ORAL_TABLET | Freq: Two times a day (BID) | ORAL | 0 refills | Status: DC
  Filled 2022-02-28: qty 60, 30d supply, fill #0

## 2022-02-28 NOTE — Discharge Summary (Signed)
Physician Discharge Summary  Stephanie Gonzalez [id number] DOB: 1940/08/10 DOA: 02/27/2022  PCP: Claretta Fraise, MD  Admit date: 02/27/2022 Discharge date: 02/28/2022  Admitted From: Home Disposition:  Home  Recommendations for Outpatient Follow-up:  Follow up with PCP in 1-2 weeks Please obtain BMP/CBC in one week To follow with cardiology on scheduled appointment 10/4.   Discharge Condition: Stable CODE STATUS: FULL Diet recommendation: Heart Healthy  Brief/Interim Summary:  Stephanie Gonzalez is a very pleasant 81 y.o. female with medical history significant for hypertension and chronic back pain, presented to the emergency department with exertional dyspnea, fatigue, and neck and jaw pain. -Her work-up was significant for A-fib with RVR in ED, heart rate up in the 160s, so she was admitted for further work-up.  New-onset atrial fibrillation with RVR  - Presents with b/l jaw and neck discomfort after 3-4 wks of DOE and fatigue and is found to be in rapid atrial fibrillation  - She was given 10 mg IV diltiazem x2 and started on diltiazem infusion and Eliquis in ED  - She is in SR on arrival to [facility]  - In terms of etiology, TSH is normal, she does not drink alcohol, has not had echo  -Patient was started on metoprolol 25 mg p.o. twice daily, with good heart rate control, she maintained sinus rhythm overnight, cardiology has seen and evaluated the patient, 2D echo was obtained, preserved EF, no regional wall motion abnormalities, she will be discharged on Eliquis and metoprolol, and follow-up appointment has already arranged by cardiology on 10/4   2. Hypertension  - Starting metoprolol as above, holding Norvasc on discharge   3. Chronic back pain  -Continue with home regimen        Discharge Diagnoses:  Principal Problem:   New onset atrial fibrillation ([place]) Active Problems:   Essential hypertension   Chronic pain    Discharge Instructions  Discharge Instructions     Amb  referral to AFIB Clinic   Complete by: As directed    Diet - low sodium heart healthy   Complete by: As directed    Discharge instructions   Complete by: As directed    Follow with Primary MD Claretta Fraise, MD in 7 days   Get CBC, CMP,  checked  by Primary MD next visit.    Activity: As tolerated with Full fall precautions use walker/cane & assistance as needed   Disposition Home    Diet: Heart Healthy    On your next visit with your primary care physician please Get Medicines reviewed and adjusted.   Please request your Prim.MD to go over all Hospital Tests and Procedure/Radiological results at the follow up, please get all Hospital records sent to your Prim MD by signing hospital release before you go home.   If you experience worsening of your admission symptoms, develop shortness of breath, life threatening emergency, suicidal or homicidal thoughts you must seek medical attention immediately by calling 911 or calling your MD immediately  if symptoms less severe.  You Must read complete instructions/literature along with all the possible adverse reactions/side effects for all the Medicines you take and that have been prescribed to you. Take any new Medicines after you have completely understood and accpet all the possible adverse reactions/side effects.   Do not drive, operating heavy machinery, perform activities at heights, swimming or participation in water activities or provide baby sitting services if your were admitted for syncope or siezures until you have seen by Primary MD or  a Neurologist and advised to do so again.  Do not drive when taking Pain medications.    Do not take more than prescribed Pain, Sleep and Anxiety Medications  Special Instructions: If you have smoked or chewed Tobacco  in the last 2 yrs please stop smoking, stop any regular Alcohol  and or any Recreational drug use.  Wear Seat belts while driving.   Please note  You were cared for by a  hospitalist during your hospital stay. If you have any questions about your discharge medications or the care you received while you were in the hospital after you are discharged, you can call the unit and asked to speak with the hospitalist on call if the hospitalist that took care of you is not available. Once you are discharged, your primary care physician will handle any further medical issues. Please note that NO REFILLS for any discharge medications will be authorized once you are discharged, as it is imperative that you return to your primary care physician (or establish a relationship with a primary care physician if you do not have one) for your aftercare needs so that they can reassess your need for medications and monitor your lab values.   Increase activity slowly   Complete by: As directed    No wound care   Complete by: As directed       Allergies as of 02/28/2022       Reactions   Other Other (See Comments)   Oranges; juice or nuts    Tetracycline Rash, Other (See Comments)   Agitation        Medication List     STOP taking these medications    amLODipine 5 MG tablet Commonly known as: NORVASC       TAKE these medications    apixaban 5 MG Tabs tablet Commonly known as: ELIQUIS Take 1 tablet (5 mg total) by mouth 2 (two) times daily.   atorvastatin 40 MG tablet Commonly known as: LIPITOR Take 1 tablet (40 mg total) by mouth daily.   b complex vitamins tablet Take 1 tablet by mouth daily.   clonazePAM 1 MG tablet Commonly known as: KLONOPIN Take 1 tablet (1 mg total) by mouth at bedtime. For restless legs   famotidine 20 MG tablet Commonly known as: Pepcid Take 1 tablet (20 mg total) by mouth 2 (two) times daily.   fenofibrate 160 MG tablet TAKE 1 TABLET BY MOUTH  DAILY FOR CHOLESTEROL AND  TRIGLYCERIDE What changed: See the new instructions.   HYDROcodone-acetaminophen 10-325 MG tablet Commonly known as: NORCO Take 0.5 tablets by mouth 2 (two)  times daily as needed for moderate pain. What changed: when to take this   metoprolol tartrate 25 MG tablet Commonly known as: LOPRESSOR Take 1 tablet (25 mg total) by mouth 2 (two) times daily.   REFRESH DRY EYE THERAPY OP Place 1-2 drops into both eyes at bedtime.   sertraline 50 MG tablet Commonly known as: Zoloft Take 0.5-1 tablets (25-50 mg total) by mouth daily. What changed: how much to take        Follow-up Information     Darreld Mclean, PA-C Follow up.   Specialties: Physician Assistant, Cardiology Why: Hospital follow-up with Cardiology scheduled for 03/12/2022 at 1:55pm. Please arrive 15 minutes early for check-in. If this date/time does not work for you, please call our office to reschedule. Contact information: [address] [place] [address] [phone]  Allergies  Allergen Reactions   Other Other (See Comments)    Oranges; juice or nuts    Tetracycline Rash and Other (See Comments)    Agitation    Consultations: Cardiolgoy   Procedures/Studies: ECHOCARDIOGRAM COMPLETE  Result Date: 02/28/2022    ECHOCARDIOGRAM REPORT   Patient Name:   Stephanie Gonzalez Date of Exam: 02/28/2022 Medical Rec #:  [id number]     Height:       65.0 in Accession #:    [id number]    Weight:       168.7 lb Date of Birth:  21-Apr-1941    BSA:          1.840 m Patient Age:    39 years      BP:           149/62 mmHg Patient Gender: F             HR:           56 bpm. Exam Location:  Inpatient Procedure: 2D Echo, Cardiac Doppler and Color Doppler Indications:    Atrial fibrillation  History:        Patient has no prior history of Echocardiogram examinations.                 Risk Factors:Hypertension, Diabetes and Dyslipidemia.  Sonographer:    Jefferey Pica Referring Phys: [id number] [place]  1. Left ventricular ejection fraction, by estimation, is 60 to 65%. The left ventricle has normal function. The left ventricle has no regional  wall motion abnormalities. There is mild left ventricular hypertrophy. Left ventricular diastolic parameters are indeterminate.  2. Right ventricular systolic function is normal. The right ventricular size is normal. There is mildly elevated pulmonary artery systolic pressure.  3. The mitral valve is normal in structure. Mild mitral valve regurgitation. No evidence of mitral stenosis.  4. The aortic valve is normal in structure. Aortic valve regurgitation is not visualized. Aortic valve sclerosis is present, with no evidence of aortic valve stenosis.  5. The inferior vena cava is normal in size with greater than 50% respiratory variability, suggesting right atrial pressure of 3 mmHg. FINDINGS  Left Ventricle: Left ventricular ejection fraction, by estimation, is 60 to 65%. The left ventricle has normal function. The left ventricle has no regional wall motion abnormalities. The left ventricular internal cavity size was normal in size. There is  mild left ventricular hypertrophy. Left ventricular diastolic parameters are indeterminate. Right Ventricle: The right ventricular size is normal. No increase in right ventricular wall thickness. Right ventricular systolic function is normal. There is mildly elevated pulmonary artery systolic pressure. The tricuspid regurgitant velocity is 2.80  m/s, and with an assumed right atrial pressure of 5 mmHg, the estimated right ventricular systolic pressure is 58.0 mmHg. Left Atrium: Left atrial size was normal in size. Right Atrium: Right atrial size was normal in size. Pericardium: There is no evidence of pericardial effusion. Mitral Valve: The mitral valve is normal in structure. There is mild thickening of the mitral valve leaflet(s). There is mild calcification of the mitral valve leaflet(s). Mild mitral annular calcification. Mild mitral valve regurgitation. No evidence of  mitral valve stenosis. Tricuspid Valve: The tricuspid valve is normal in structure. Tricuspid valve  regurgitation is mild . No evidence of tricuspid stenosis. Aortic Valve: The aortic valve is normal in structure. Aortic valve regurgitation is not visualized. Aortic valve sclerosis is present, with no evidence of aortic valve stenosis. Aortic valve peak gradient measures  11.2 mmHg. Pulmonic Valve: The pulmonic valve was normal in structure. Pulmonic valve regurgitation is not visualized. No evidence of pulmonic stenosis. Aorta: The aortic root is normal in size and structure. Venous: The inferior vena cava is normal in size with greater than 50% respiratory variability, suggesting right atrial pressure of 3 mmHg. IAS/Shunts: No atrial level shunt detected by color flow Doppler.  LEFT VENTRICLE PLAX 2D LVIDd:         5.00 cm   Diastology LVIDs:         2.80 cm   LV e' medial:    4.19 cm/s LV PW:         1.20 cm   LV E/e' medial:  20.2 LV IVS:        1.20 cm   LV e' lateral:   5.64 cm/s LVOT diam:     2.00 cm   LV E/e' lateral: 15.0 LV SV:         103 LV SV Index:   56 LVOT Area:     3.14 cm  RIGHT VENTRICLE            IVC RV Basal diam:  2.50 cm    IVC diam: 1.60 cm RV S prime:     9.93 cm/s TAPSE (M-mode): 1.5 cm LEFT ATRIUM             Index        RIGHT ATRIUM           Index LA diam:        3.90 cm 2.12 cm/m   RA Area:     14.40 cm LA Vol (A2C):   44.7 ml 24.30 ml/m  RA Volume:   35.70 ml  19.40 ml/m LA Vol (A4C):   51.2 ml 27.83 ml/m LA Biplane Vol: 50.1 ml 27.23 ml/m  AORTIC VALVE                 PULMONIC VALVE AV Area (Vmax): 2.50 cm     PV Vmax:       0.85 m/s AV Vmax:        167.67 cm/s  PV Peak grad:  2.9 mmHg AV Peak Grad:   11.2 mmHg LVOT Vmax:      133.50 cm/s LVOT Vmean:     86.950 cm/s LVOT VTI:       0.329 m  AORTA Ao Root diam: 3.10 cm Ao Asc diam:  2.60 cm MITRAL VALVE               TRICUSPID VALVE MV Area (PHT): 1.67 cm    TR Peak grad:   31.4 mmHg MV Decel Time: 455 msec    TR Vmax:        280.00 cm/s MV E velocity: 84.70 cm/s MV A velocity: 94.50 cm/s  SHUNTS MV E/A ratio:  0.90         Systemic VTI:  0.33 m                            Systemic Diam: 2.00 cm Aditya Sabharwal Electronically signed by Hebert Soho Signature Date/Time: 02/28/2022/2:35:01 PM    Final    DG Chest Port 1 View  Result Date: 02/27/2022 CLINICAL DATA:  Shortness of breath. EXAM: PORTABLE CHEST 1 VIEW COMPARISON:  December 24, 2016. FINDINGS: The heart size and mediastinal contours are within normal limits. Both lungs are clear. The visualized skeletal structures are unremarkable. IMPRESSION: No  active disease. Electronically Signed   By: Marijo Conception M.D.   On: 02/27/2022 14:07      Subjective:  She complains of chronic back pain, otherwise denies any dyspnea, fatigue or chest pain. Discharge Exam: Vitals:   02/28/22 0755 02/28/22 1111  BP: (!) 134/97 (!) 149/62  Pulse: (!) 58 (!) 56  Resp: 16 18  Temp: 98 F (36.7 C) 97.8 F (36.6 C)  SpO2: 90% 98%   Vitals:   02/28/22 0002 02/28/22 0316 02/28/22 0755 02/28/22 1111  BP: (!) 148/55 124/78 (!) 134/97 (!) 149/62  Pulse: 60 (!) 54 (!) 58 (!) 56  Resp: 18 18 16 18   Temp:  97.9 F (36.6 C) 98 F (36.7 C) 97.8 F (36.6 C)  TempSrc:  Oral Oral Oral  SpO2: 96% 98% 90% 98%  Weight:      Height:        General: Pt is alert, awake, not in acute distress Cardiovascular: RRR, S1/S2 +, no rubs, no gallops Respiratory: CTA bilaterally, no wheezing, no rhonchi Abdominal: Soft, NT, ND, bowel sounds + Extremities: no edema, no cyanosis    The results of significant diagnostics from this hospitalization (including imaging, microbiology, ancillary and laboratory) are listed below for reference.     Microbiology: Recent Results (from the past 240 hour(s))  MRSA Next Gen by PCR, Nasal     Status: None   Collection Time: 02/27/22  9:06 PM   Specimen: Nasal Mucosa; Nasal Swab  Result Value Ref Range Status   MRSA by PCR Next Gen NOT DETECTED NOT DETECTED Final    Comment: (NOTE) The GeneXpert MRSA Assay (FDA approved for NASAL specimens  only), is one component of a comprehensive MRSA colonization surveillance program. It is not intended to diagnose MRSA infection nor to guide or monitor treatment for MRSA infections. Test performance is not FDA approved in patients less than 45 years old. Performed at [facility], [place] [address], [place], [place] [address]      Labs: BNP (last 3 results) Recent Labs    02/27/22 1410  BNP [id number]*   Basic Metabolic Panel: Recent Labs  Lab 02/27/22 1358 02/27/22 1410 02/28/22 0312  NA 141  --  142  K 3.4*  --  4.4  CL 106  --  112*  CO2 24  --  27  GLUCOSE 133*  --  109*  BUN 14  --  13  CREATININE 0.84  --  0.76  CALCIUM 9.8  --  8.8*  MG  --  2.1  --    Liver Function Tests: No results for input(s): "AST", "ALT", "ALKPHOS", "BILITOT", "PROT", "ALBUMIN" in the last 168 hours. No results for input(s): "LIPASE", "AMYLASE" in the last 168 hours. No results for input(s): "AMMONIA" in the last 168 hours. CBC: Recent Labs  Lab 02/27/22 1358 02/28/22 0312  WBC 10.5 9.3  HGB 14.0 12.0  HCT 41.4 36.8  MCV 83.5 87.0  PLT 231 205   Cardiac Enzymes: No results for input(s): "CKTOTAL", "CKMB", "CKMBINDEX", "TROPONINI" in the last 168 hours. BNP: Invalid input(s): "POCBNP" CBG: No results for input(s): "GLUCAP" in the last 168 hours. D-Dimer No results for input(s): "DDIMER" in the last 72 hours. Hgb A1c No results for input(s): "HGBA1C" in the last 72 hours. Lipid Profile No results for input(s): "CHOL", "HDL", "LDLCALC", "TRIG", "CHOLHDL", "LDLDIRECT" in the last 72 hours. Thyroid function studies Recent Labs    02/27/22 1411  TSH 1.112   Anemia work  up No results for input(s): "VITAMINB12", "FOLATE", "FERRITIN", "TIBC", "IRON", "RETICCTPCT" in the last 72 hours. Urinalysis    Component Value Date/Time   COLORURINE YELLOW 09/22/2010 [place] 09/22/2010 1134   LABSPEC 1.007 09/22/2010 1134   PHURINE 6.0 09/22/2010 1134   GLUCOSEU  NEGATIVE 09/22/2010 1134   HGBUR NEGATIVE 09/22/2010 1134   BILIRUBINUR n 07/01/2016 1041   KETONESUR NEGATIVE 09/22/2010 1134   PROTEINUR n 07/01/2016 1041   PROTEINUR NEGATIVE 09/22/2010 1134   UROBILINOGEN 0.2 07/01/2016 1041   UROBILINOGEN 0.2 09/22/2010 1134   NITRITE n 07/01/2016 1041   NITRITE NEGATIVE 09/22/2010 1134   LEUKOCYTESUR Trace (A) 07/01/2016 1041   Sepsis Labs Recent Labs  Lab 02/27/22 1358 02/28/22 0312  WBC 10.5 9.3   Microbiology Recent Results (from the past 240 hour(s))  MRSA Next Gen by PCR, Nasal     Status: None   Collection Time: 02/27/22  9:06 PM   Specimen: Nasal Mucosa; Nasal Swab  Result Value Ref Range Status   MRSA by PCR Next Gen NOT DETECTED NOT DETECTED Final    Comment: (NOTE) The GeneXpert MRSA Assay (FDA approved for NASAL specimens only), is one component of a comprehensive MRSA colonization surveillance program. It is not intended to diagnose MRSA infection nor to guide or monitor treatment for MRSA infections. Test performance is not FDA approved in patients less than 60 years old. Performed at [facility], [place] [address], [place], [place] [address]      Time coordinating discharge: Over 30 minutes  SIGNED:   Phillips Climes, MD  [facility] 02/28/2022, 3:18 PM Pager   If 7PM-7AM, please contact night-coverage [email or web address]

## 2022-02-28 NOTE — Discharge Instructions (Signed)

## 2022-02-28 NOTE — TOC Transition Note (Signed)
Transition of Care [facility]) - CM/SW Discharge Note   Patient Details  Name: Stephanie Gonzalez MRN: [id number] Date of Birth: 1940/07/06  [facility] CM/SW Contact:  Pollie Friar, RN Phone Number: 02/28/2022, 3:31 PM   Clinical Narrative:    Pt discharging home with self care.  New Eliquis sent to [facility] so will be covered with 30 day free coupon.  Pt has transportation home.   Final next level of care: Home/Self Care Barriers to Discharge: No Barriers Identified   Patient Goals and CMS Choice        Discharge Placement                       Discharge Plan and Services                                     Social Determinants of Health (SDOH) Interventions Housing Interventions: Intervention Not Indicated   Readmission Risk Interventions     No data to display

## 2022-02-28 NOTE — Progress Notes (Signed)
Rounding Note    Patient Name: Stephanie Gonzalez Date of Encounter: 02/28/2022  [place] Cardiologist: None ([facility] - [place] [facility])  Subjective   Feels great, maintaining NSR. No Cv complaints. Echo pending.  Inpatient Medications    Scheduled Meds:  apixaban  5 mg Oral BID   clonazePAM  1 mg Oral QHS   metoprolol tartrate  25 mg Oral BID   Continuous Infusions:  PRN Meds: acetaminophen, HYDROcodone-acetaminophen   Vital Signs    Vitals:   02/27/22 2243 02/28/22 0002 02/28/22 0316 02/28/22 0755  BP: (!) 152/55 (!) 148/55 124/78 (!) 134/97  Pulse: 64 60 (!) 54 (!) 58  Resp:  18 18 16   Temp:   97.9 F (36.6 C) 98 F (36.7 C)  TempSrc:   Oral Oral  SpO2:  96% 98% 90%  Weight:      Height:        Intake/Output Summary (Last 24 hours) at 02/28/2022 0907 Last data filed at 02/28/2022 0527 Gross per 24 hour  Intake 240 ml  Output 275 ml  Net -35 ml      02/27/2022    9:04 PM 02/27/2022    1:53 PM 10/22/2021    1:41 PM  Last 3 Weights  Weight (lbs) 168 lb 10.4 oz 165 lb 163 lb 9.6 oz  Weight (kg) 76.5 kg 74.844 kg 74.208 kg      Telemetry    NSR/mild sinus bradycardia, QRS appears to be narrow again- Personally Reviewed  ECG    AFib w RVR, LBBB - Personally Reviewed  Physical Exam  Very comfortable. GEN: No acute distress.   Neck: No JVD Cardiac: RRR, normal S1-S2, no murmurs, rubs, or gallops.  Respiratory: Clear to auscultation bilaterally. GI: Soft, nontender, non-distended  MS: No edema; No deformity. Neuro:  Nonfocal  Psych: Normal affect   Labs    High Sensitivity Troponin:   Recent Labs  Lab 02/27/22 1358 02/27/22 1620  TROPONINIHS 10 26*     Chemistry Recent Labs  Lab 02/27/22 1358 02/27/22 1410 02/28/22 0312  NA 141  --  142  K 3.4*  --  4.4  CL 106  --  112*  CO2 24  --  27  GLUCOSE 133*  --  109*  BUN 14  --  13  CREATININE 0.84  --  0.76  CALCIUM 9.8  --  8.8*  MG  --  2.1  --   GFRNONAA >60  --  >60  ANIONGAP  11  --  3*    Lipids No results for input(s): "CHOL", "TRIG", "HDL", "LABVLDL", "LDLCALC", "CHOLHDL" in the last 168 hours.  Hematology Recent Labs  Lab 02/27/22 1358 02/28/22 0312  WBC 10.5 9.3  RBC 4.96 4.23  HGB 14.0 12.0  HCT 41.4 36.8  MCV 83.5 87.0  MCH 28.2 28.4  MCHC 33.8 32.6  RDW 13.0 13.3  PLT 231 205   Thyroid  Recent Labs  Lab 02/27/22 1411  TSH 1.112    BNP Recent Labs  Lab 02/27/22 1410  BNP 135.9*    DDimer No results for input(s): "DDIMER" in the last 168 hours.   Radiology    DG Chest Port 1 View  Result Date: 02/27/2022 CLINICAL DATA:  Shortness of breath. EXAM: PORTABLE CHEST 1 VIEW COMPARISON:  December 24, 2016. FINDINGS: The heart size and mediastinal contours are within normal limits. Both lungs are clear. The visualized skeletal structures are unremarkable. IMPRESSION: No active disease. Electronically Signed   By:  Lupita Raider M.D.   On: 02/27/2022 14:07    Cardiac Studies   Echo pending  Patient Profile     81 y.o. female with a history of hypertension but no other cardiac illness, chronic back pain with multiple prior back surgeries, presents with first ever recognized episode of atrial fibrillation rapid ventricular response manifesting as jaw and neck discomfort, fatigue and increased dyspnea on exertion.  She was unaware of palpitations.  Assessment & Plan    Echocardiogram pending.  TSH normal.  Does not have symptoms suggestive of sleep apnea. We will repeat ECG.  The QRS complex on telemetry appears to be narrow so she probably has a rate related left bundle branch block. Only previous work-up for ischemic heart disease was a normal nuclear stress test in 1997. There are no major abnormal findings on echocardiogram, can DC home on apixaban and metoprolol.  We will plan outpatient coronary CT angiography since her presentation was with angina.     For questions or updates, please contact [facility] Please consult  [email or web address] for contact info under        Signed, Thurmon Fair, MD  02/28/2022, 9:07 AM

## 2022-03-05 ENCOUNTER — Telehealth: Payer: Self-pay | Admitting: *Deleted

## 2022-03-05 ENCOUNTER — Encounter: Payer: Self-pay | Admitting: *Deleted

## 2022-03-05 NOTE — Patient Outreach (Signed)
  Care Coordination [facility] Note Transition Care Management Follow-up Telephone Call Date of discharge and from where: [facility] on 02/28/22 (observation) How have you been since you were released from the hospital? "I'm doing pretty well. I haven't had any more pains but still don't have a lot of energy" Any questions or concerns? No  Items Reviewed: Did the pt receive and understand the discharge instructions provided? Yes  Medications obtained and verified? Yes . Stopped amlodipine. Started metoprolol and eliquis. Other? Yes Cost of Eliquis. Hasn't purchased at pharmacy yet. Provided at hospital discharge. Advised of 30 day savings card that can be used even with Medicare. Discussed Medicare coverage gap and that she may be ok through the end of this year, but the cost of Eliquis will likely put her in the coverage gap next year. Discussed decreased energy level. Advised that can be a side effect of metoprolol and eliquis and will likely improve. Questions answered about flu shot. Patient plans to get that at PCP f/u on 10/4. Any new allergies since your discharge? No  Dietary orders reviewed? Yes Do you have support at home? Yes   Home Care and Equipment/Supplies: Were home health services ordered? no If so, what is the name of the agency? N/a  Has the agency set up a time to come to the patient's home? not applicable Were any new equipment or medical supplies ordered?  No What is the name of the medical supply agency? N/a Were you able to get the supplies/equipment? not applicable Do you have any questions related to the use of the equipment or supplies? No  Functional Questionnaire: (I = Independent and D = Dependent) ADLs: I  Bathing/Dressing- I  Meal Prep- I  Eating- I  Maintaining continence- I  Transferring/Ambulation- I  Managing Meds- I  Follow up appointments reviewed:  PCP Hospital f/u appt confirmed? Yes  Scheduled to see Dr Livia Snellen on 03/17/22 @ 2:20. [facility]  f/u appt confirmed? Yes  Scheduled to see Sande Rives, PA-C (cardio) on 03/12/22 @ 1:55. Are transportation arrangements needed? No  If their condition worsens, is the pt aware to call PCP or go to the Emergency Dept.? Yes Was the patient provided with contact information for the PCP's office or ED? Yes Was to pt encouraged to call back with questions or concerns? Yes  SDOH assessments and interventions completed:   Yes  Care Coordination Interventions Activated:  No   Care Coordination Interventions:  No Care Coordination interventions needed at this time.   Encounter Outcome:  Pt. Visit Completed    Chong Sicilian, BSN, RN-BC RN Care Coordinator [place] Direct Dial: [phone] Main #: [phone]

## 2022-03-09 NOTE — Progress Notes (Unsigned)
Cardiology Office Note:    Date:  03/12/2022   ID:  [name], DOB 04-08-1941, MRN [id number]  PCP:  Claretta Fraise, MD  Cardiologist:  Sanda Klein, MD  Electrophysiologist:  None   Referring MD: Claretta Fraise, MD   Chief Complaint: hospital follow-up of atrial fibrillation  History of Present Illness:    Stephanie Gonzalez is a 81 y.o. female with a history of hypertension, hyperlipidemia, type 2 diabetes mellitus, vertigo, asthma, restless leg syndrome, and chronic back pain who is followed by Dr. Sallyanne Kuster and presents today for hospital follow-up of atrial fibrillation.  Patient was first seen by Cardiology during recent admission. She was admitted from 02/27/2022 to 02/28/2022 for new onset atrial fibrillation with RVR after presenting with exertional dyspnea, fatigue, and neck/jaw pain. Heart rates were initially in the 160s on arrival. High-sensitivity troponin was 10 >> 26 consistent with demand ischemia. Patient was started on IV Diltiazem and converted back to normal sinus rhythm. Symptoms resolved with restoration of sinus rhythm. Echo showed LVEF of 60-65% with no regional wall motion abnormalities, normal RV, mild MR, and mildly elevated PASP. She was transitioned to Lopressor and started on Eliquis with plans for outpatient coronary CTA.  Patient presents today for follow-up. Here with daughter.  She reports that she continues to feel very tired and weak and has decreased energy. This fatigue is not completely new. She states it has been going on for about 2-3 months. However, patient and daughter think it may be a little more noticeable since leaving the hospital. She denies any recurrent jaw pain or any chest pain. However, she does continue to have right shoulder pain. Her daughter thinks it is because that is the side she sleeps on. However, she did present with jaw pain and bilateral shoulder pain during recent hospitalizations. She is currently maintaining sinus rhythm with  heart rate of 48 bpm on EKG today. She denies any palpitations. She reports some unsteadiness on her feet but no significant lightheadedness/ dizziness no syncope. No shortness of breath, orthopnea, PND, or lower extremity edema. She is tolerating Eliquis well with no abnormal bleeding. She states she is very scared that she is going to have recurrent atrial fibrillation.  Past Medical History:  Diagnosis Date   Allergy    Arthritis    ASTHMA 10/06/2007   Bronchial asthma    BURSITIS, RIGHT HIP 02/04/2010   DIABETES MELLITUS, TYPE II 11/01/2009   Family history of adverse reaction to anesthesia    Mother had n/v   H/O vertigo    History of kidney stones    passed on her own   HYPERLIPIDEMIA 10/06/2007   HYPERTENSION 10/06/2007   Pneumonia    Restless leg syndrome     Past Surgical History:  Procedure Laterality Date   ABDOMINAL HYSTERECTOMY     APPENDECTOMY     CARDIAC CATHETERIZATION  1992   ? 90's   CHOLECYSTECTOMY     COLONOSCOPY     KNEE ARTHROSCOPY Right    SPINAL CORD STIMULATOR INSERTION N/A 07/13/2014   Procedure: LUMBAR SPINAL CORD STIMULATOR INSERTION;  Surgeon: Melina Schools, MD;  Location: [facility];  Service: Orthopedics;  Laterality: N/A;   SPINE SURGERY     TONSILLECTOMY     TUBAL LIGATION      Current Medications: Current Meds  Medication Sig   apixaban (ELIQUIS) 5 MG TABS tablet Take 1 tablet (5 mg total) by mouth 2 (two) times daily.   atorvastatin (LIPITOR) 40 MG  tablet Take 1 tablet (40 mg total) by mouth daily.   b complex vitamins tablet Take 1 tablet by mouth daily.   clonazePAM (KLONOPIN) 1 MG tablet Take 1 tablet (1 mg total) by mouth at bedtime. For restless legs   famotidine (PEPCID) 20 MG tablet Take 1 tablet (20 mg total) by mouth 2 (two) times daily.   fenofibrate 160 MG tablet TAKE 1 TABLET BY MOUTH  DAILY FOR CHOLESTEROL AND  TRIGLYCERIDE (Patient taking differently: Take 160 mg by mouth daily.)   Glycerin-Polysorbate 80 (REFRESH DRY EYE THERAPY OP)  Place 1-2 drops into both eyes at bedtime.   HYDROcodone-acetaminophen (NORCO) 10-325 MG tablet Take 0.5 tablets by mouth 2 (two) times daily as needed for moderate pain. (Patient taking differently: Take 0.5 tablets by mouth every 8 (eight) hours as needed for moderate pain.)   metoprolol succinate (TOPROL XL) 25 MG 24 hr tablet Take 1 tablet (25 mg total) by mouth daily.   sertraline (ZOLOFT) 50 MG tablet Take 0.5-1 tablets (25-50 mg total) by mouth daily. (Patient taking differently: Take 25 mg by mouth daily.)   [DISCONTINUED] metoprolol tartrate (LOPRESSOR) 25 MG tablet Take 1 tablet (25 mg total) by mouth 2 (two) times daily.     Allergies:   Other and Tetracycline   Social History   Socioeconomic History   Marital status: Married    Spouse name: Stephanie Gonzalez   Number of children: 3   Years of education: 13   Highest education level: Some college, no degree  Occupational History    Employer: RETIRED  Tobacco Use   Smoking status: Never    Passive exposure: Never   Smokeless tobacco: Never  Vaping Use   Vaping Use: Never used  Substance and Sexual Activity   Alcohol use: No   Drug use: No   Sexual activity: Not Currently    Birth control/protection: Surgical  Other Topics Concern   Not on file  Social History Narrative   Not on file   Social Determinants of Health   Financial Resource Strain: Low Risk  (03/05/2022)   Overall Financial Resource Strain (CARDIA)    Difficulty of Paying Living Expenses: Not hard at all  Food Insecurity: No Food Insecurity (02/28/2022)   Hunger Vital Sign    Worried About Running Out of Food in the Last Year: Never true    [place] in the Last Year: Never true  Transportation Needs: No Transportation Needs (03/05/2022)   PRAPARE - Hydrologist (Medical): No    Lack of Transportation (Non-Medical): No  Physical Activity: Insufficiently Active (05/10/2021)   Exercise Vital Sign    Days of Exercise per Week: 7  days    Minutes of Exercise per Session: 20 min  Stress: No Stress Concern Present (05/10/2021)   [place]    Feeling of Stress : Not at all  Social Connections: [place] (05/10/2021)   Social Connection and Isolation Panel [NHANES]    Frequency of Communication with Friends and Family: More than three times a week    Frequency of Social Gatherings with Friends and Family: More than three times a week    Attends Religious Services: More than 4 times per year    Active Member of [employer] or Organizations: Yes    Attends Music therapist: More than 4 times per year    Marital Status: Married     Family History: The patient's  family history includes Alcoholism in her father; Alzheimer's disease in her mother; Cancer in her father. There is no history of Breast cancer.  ROS:   Please see the history of present illness.     EKGs/Labs/Other Studies Reviewed:    The following studies were reviewed:  Echocardiogram 02/28/2022: Impressions: 1. Left ventricular ejection fraction, by estimation, is 60 to 65%. The  left ventricle has normal function. The left ventricle has no regional  wall motion abnormalities. There is mild left ventricular hypertrophy.  Left ventricular diastolic parameters  are indeterminate.   2. Right ventricular systolic function is normal. The right ventricular  size is normal. There is mildly elevated pulmonary artery systolic  pressure.   3. The mitral valve is normal in structure. Mild mitral valve  regurgitation. No evidence of mitral stenosis.   4. The aortic valve is normal in structure. Aortic valve regurgitation is  not visualized. Aortic valve sclerosis is present, with no evidence of  aortic valve stenosis.   5. The inferior vena cava is normal in size with greater than 50%  respiratory variability, suggesting right atrial pressure of 3 mmHg.    EKG:  EKG ordered  today. EKG personally reviewed and demonstrates sinus bradycardia, rate 48 bpm, with T wave inversions in lead aVL and nonspecific IVCD (QRS interval 122 ms). QTc 439 ms.  Recent Labs: 10/22/2021: ALT 8 02/27/2022: B Natriuretic Peptide 135.9; Magnesium 2.1; TSH 1.112 02/28/2022: BUN 13; Creatinine, Ser 0.76; Hemoglobin 12.0; Platelets 205; Potassium 4.4; Sodium 142  Recent Lipid Panel    Component Value Date/Time   CHOL 174 10/22/2021 1353   TRIG 207 (H) 10/22/2021 1353   HDL 57 10/22/2021 1353   CHOLHDL 3.1 10/22/2021 1353   CHOLHDL 5 08/04/2017 0859   VLDL 46.6 (H) 08/04/2017 0859   LDLCALC 83 10/22/2021 1353   LDLDIRECT 95.0 08/04/2017 0859    Physical Exam:    Vital Signs: BP 124/62   Pulse (!) 52   Ht 5\' 6"  (1.676 m)   Wt 169 lb (76.7 kg)   SpO2 97%   BMI 27.28 kg/m     Wt Readings from Last 3 Encounters:  03/12/22 169 lb (76.7 kg)  02/27/22 168 lb 10.4 oz (76.5 kg)  10/22/21 163 lb 9.6 oz (74.2 kg)     General: 81 y.o. Caucasian female in no acute distress. HEENT: Normocephalic and atraumatic. Sclera clear.  Neck: Supple. No carotid bruits. No JVD. Heart: Bradycardic with normal rhythm. Distinct S1 and S2. No murmurs, gallops, or rubs. Lungs: No increased work of breathing. Clear to ausculation bilaterally. No wheezes, rhonchi, or rales.  Abdomen: Soft, non-distended, and non-tender to palpation.   Extremities: No lower extremity edema.    Skin: Warm and dry. Neuro: Alert and oriented x3. No focal deficits. Psych: Normal affect. Responds appropriately.   Assessment:    1. Paroxysmal atrial fibrillation (HCC)   2. Primary hypertension   3. Hyperlipidemia, unspecified hyperlipidemia type   4. Left shoulder pain, unspecified chronicity   5. Type 2 diabetes mellitus without obesity (HCC)     Plan:    Paroxysmal Atrial Fibrillation Patient was recently admitted for new onset atrial fibrillation with RVR with rates initially in the 160s. Thankfully converted  back to sinus rhythm after being starting on IV Diltiazem. Echo showed normal LV function with no regional wall motion abnormalities. - EKG today shows sinus bradycardia with rate of 48 bpm. She does feel very fatigue and weak with this although fatigue not entirely  new. - Currently on Lopressor 25mg  twice daily. Will stop this and switch Toprol-XL 25mg  daily (which is cutting daily dose of Metoprolol in half). Did consider stopping it all together or switching her to Cardizem; however, patient and family preferred to switch to Toprol-XL. Patient is very worried about having recurrent atrial fibrillation. - Continue Eliquis 5mg  twice daily. - Will order coronary CTA to rule out ischemia as cause of atrial fibrillation given patient presented with jaw pain/shoulder pain is still having shoulder pain. - Asked patient to keep an eye on her BP/HR and notify us if HR staying below 50 bpm.  Right Shoulder Pain Patient presented with jaw pain and bilateral shoulder pain during recent admission for rapid atrial fibrillation. She denies any recurrent jaw pain or chest pain but does continue to report right shoulder pain. - EKG shows no acute ischemic changes. - This may be musculoskeletal but will get coronary CTA as above to rule out any significant CAD. Recommended OTC Tylenol for the pain.   Hypertension BP initially mildly elevated at 142/78 but then improved to 124/62 on my personal recheck at the end of visit. - Switching from Lopressor to Toprol-XL as above.  Hyperlipidemia Most recent lipid panel in 10/2021: Total Cholesterol 174, Triglycerides 207, HDL 57, LDL 83.  - Continue Lipitor 40mg  daily. - Followed by PCP.  Type 2 Diabetes Mellitus History of type 2 diabetes mellitus listed in chart but not on any medications.  - Hemoglobin A1c 4.7% in 10/2021. - Management per PCP.  Disposition: Follow up in 3 months.   Medication Adjustments/Labs and Tests Ordered: Current medicines are reviewed  at length with the patient today.  Concerns regarding medicines are outlined above.  Orders Placed This Encounter  Procedures   CT CORONARY MORPH W/CTA COR W/SCORE W/CA W/CM &/OR WO/CM   EKG 12-Lead   Meds ordered this encounter  Medications   metoprolol succinate (TOPROL XL) 25 MG 24 hr tablet    Sig: Take 1 tablet (25 mg total) by mouth daily.    Dispense:  30 tablet    Refill:  2    Patient Instructions  Medication Instructions:  STOP Metoprolol Tartrate 25 mg  START Metoprolol Succinate 25 mg daily *If you need a refill on your cardiac medications before your next appointment, please call your pharmacy*   Follow-Up: At [facility], you and your health needs are our priority.  As part of our continuing mission to provide you with exceptional heart care, we have created designated Provider Care Teams.  These Care Teams include your primary Cardiologist (physician) and Advanced Practice Providers (APPs -  Physician Assistants and Nurse Practitioners) who all work together to provide you with the care you need, when you need it.   Your next appointment:   3 month(s)  The format for your next appointment:   In Person  Provider:   Sanda Klein, MD or Sande Rives, PA-C    Other Instructions   Your cardiac CT will be scheduled at one of the below locations:   [facility] [address] [place], [place] [address] [phone]  If scheduled at [facility], please arrive at the [facility] and Children's Entrance (Entrance C2) of [facility] 30 minutes prior to test start time. You can use the FREE valet parking offered at entrance C (encouraged to control the heart rate for the test)  Proceed to the [facility] Radiology Department (first floor) to check-in and test prep.  All radiology patients and guests should use entrance C2 at [facility], accessed from [facility], even though the hospital's physical address  listed is [address]     On the Night Before the Test: Be sure to Drink plenty of water. Do not consume any caffeinated/decaffeinated beverages or chocolate 12 hours prior to your test. Do not take any antihistamines 12 hours prior to your test. If the patient has contrast allergy: Patient will need a prescription for Prednisone and very clear instructions (as follows): Prednisone 50 mg - take 13 hours prior to test Take another Prednisone 50 mg 7 hours prior to test Take another Prednisone 50 mg 1 hour prior to test Take Benadryl 50 mg 1 hour prior to test Patient must complete all four doses of above prophylactic medications. Patient will need a ride after test due to Benadryl.  On the Day of the Test: Drink plenty of water until 1 hour prior to the test. Do not eat any food 1 hour prior to test. You may take your regular medications prior to the test.  Take metoprolol (Lopressor) two hours prior to test. FEMALES- please wear underwire-free bra if available, avoid dresses & tight clothing      After the Test: Drink plenty of water. After receiving IV contrast, you may experience a mild flushed feeling. This is normal. On occasion, you may experience a mild rash up to 24 hours after the test. This is not dangerous. If this occurs, you can take Benadryl 25 mg and increase your fluid intake. If you experience trouble breathing, this can be serious. If it is severe call 911 IMMEDIATELY. If it is mild, please call our office.  We will call to schedule your test 2-4 weeks out understanding that some insurance companies will need an authorization prior to the service being performed.   For non-scheduling related questions, please contact the cardiac imaging nurse navigator should you have any questions/concerns: Marchia Bond, Cardiac Imaging Nurse Navigator Gordy Clement, Cardiac Imaging Nurse Navigator [facility] Heart and Vascular Services Direct Office Dial:  [phone]         Signed, Eppie Gibson  03/12/2022 10:48 PM    [place]

## 2022-03-12 ENCOUNTER — Encounter: Payer: Self-pay | Admitting: Student

## 2022-03-12 ENCOUNTER — Ambulatory Visit: Payer: Medicare Other | Attending: Cardiology | Admitting: Student

## 2022-03-12 VITALS — BP 124/62 | HR 52 | Ht 66.0 in | Wt 169.0 lb

## 2022-03-12 DIAGNOSIS — M25512 Pain in left shoulder: Secondary | ICD-10-CM | POA: Diagnosis not present

## 2022-03-12 DIAGNOSIS — I480 Paroxysmal atrial fibrillation: Secondary | ICD-10-CM | POA: Diagnosis not present

## 2022-03-12 DIAGNOSIS — I10 Essential (primary) hypertension: Secondary | ICD-10-CM | POA: Diagnosis not present

## 2022-03-12 DIAGNOSIS — E785 Hyperlipidemia, unspecified: Secondary | ICD-10-CM

## 2022-03-12 DIAGNOSIS — E119 Type 2 diabetes mellitus without complications: Secondary | ICD-10-CM

## 2022-03-12 MED ORDER — METOPROLOL SUCCINATE ER 25 MG PO TB24: 25.0000 mg | ORAL_TABLET | Freq: Every day | ORAL | 2 refills | Status: DC

## 2022-03-12 NOTE — Patient Instructions (Addendum)
Medication Instructions:  STOP Metoprolol Tartrate 25 mg  START Metoprolol Succinate 25 mg daily *If you need a refill on your cardiac medications before your next appointment, please call your pharmacy*   Follow-Up: At [facility], you and your health needs are our priority.  As part of our continuing mission to provide you with exceptional heart care, we have created designated Provider Care Teams.  These Care Teams include your primary Cardiologist (physician) and Advanced Practice Providers (APPs -  Physician Assistants and Nurse Practitioners) who all work together to provide you with the care you need, when you need it.   Your next appointment:   3 month(s)  The format for your next appointment:   In Person  Provider:   Sanda Klein, MD or Sande Rives, PA-C    Other Instructions   Your cardiac CT will be scheduled at one of the below locations:   [facility] [address] [place], [place] [address] [phone]  If scheduled at [facility], please arrive at the [facility] and Children's Entrance (Entrance C2) of [facility] 30 minutes prior to test start time. You can use the FREE valet parking offered at entrance C (encouraged to control the heart rate for the test)  Proceed to the [facility] Radiology Department (first floor) to check-in and test prep.  All radiology patients and guests should use entrance C2 at [facility], accessed from [facility], even though the hospital's physical address listed is [address].     On the Night Before the Test: Be sure to Drink plenty of water. Do not consume any caffeinated/decaffeinated beverages or chocolate 12 hours prior to your test. Do not take any antihistamines 12 hours prior to your test. If the patient has contrast allergy: Patient will need a prescription for Prednisone and very clear instructions (as follows): Prednisone 50 mg - take 13  hours prior to test Take another Prednisone 50 mg 7 hours prior to test Take another Prednisone 50 mg 1 hour prior to test Take Benadryl 50 mg 1 hour prior to test Patient must complete all four doses of above prophylactic medications. Patient will need a ride after test due to Benadryl.  On the Day of the Test: Drink plenty of water until 1 hour prior to the test. Do not eat any food 1 hour prior to test. You may take your regular medications prior to the test.  Take metoprolol (Lopressor) two hours prior to test. FEMALES- please wear underwire-free bra if available, avoid dresses & tight clothing      After the Test: Drink plenty of water. After receiving IV contrast, you may experience a mild flushed feeling. This is normal. On occasion, you may experience a mild rash up to 24 hours after the test. This is not dangerous. If this occurs, you can take Benadryl 25 mg and increase your fluid intake. If you experience trouble breathing, this can be serious. If it is severe call 911 IMMEDIATELY. If it is mild, please call our office.  We will call to schedule your test 2-4 weeks out understanding that some insurance companies will need an authorization prior to the service being performed.   For non-scheduling related questions, please contact the cardiac imaging nurse navigator should you have any questions/concerns: Marchia Bond, Cardiac Imaging Nurse Navigator Gordy Clement, Cardiac Imaging Nurse Navigator [place] [facility] Direct Office Dial: [phone]

## 2022-03-17 ENCOUNTER — Ambulatory Visit: Payer: Medicare Other | Admitting: Family Medicine

## 2022-03-17 ENCOUNTER — Encounter: Payer: Self-pay | Admitting: Family Medicine

## 2022-03-17 VITALS — BP 131/68 | HR 70 | Temp 97.4°F | Resp 20 | Ht 66.0 in | Wt 168.0 lb

## 2022-03-17 DIAGNOSIS — Z7901 Long term (current) use of anticoagulants: Secondary | ICD-10-CM

## 2022-03-17 DIAGNOSIS — Z23 Encounter for immunization: Secondary | ICD-10-CM

## 2022-03-17 DIAGNOSIS — G2581 Restless legs syndrome: Secondary | ICD-10-CM | POA: Diagnosis not present

## 2022-03-17 DIAGNOSIS — I4891 Unspecified atrial fibrillation: Secondary | ICD-10-CM

## 2022-03-17 MED ORDER — CLONAZEPAM 1 MG PO TABS: 1.0000 mg | ORAL_TABLET | Freq: Every day | ORAL | 0 refills | Status: DC

## 2022-03-17 NOTE — Progress Notes (Signed)
Subjective:  Patient ID: Stephanie Gonzalez, female    DOB: January 04, 1941  Age: 81 y.o. MRN: [id number]  CC: Hospitalization Follow-up   HPI [name] presents for hospital follow up. Went to hospital on 9/21. Dx was A. Fib with RVR. Her symptom was heaviness in shoulder and achiness at the anterior neck. Still tired, weak, no energy. Family reunion yesterday, but wasn't hungry. Has Cardiac Calcium CT set for 10/19 (which is also her 81th birthday).PDMP review shows appropriate utilization of the patient's controlled medications.   Pt. Insisted on refills of her clonazepam. Has beentaking regularly without proble. Due back for recheck in 6 weeks. Very adamant that she shouldn't have to return so soon.      03/17/2022    2:33 PM 10/22/2021    1:41 PM 05/10/2021    1:23 PM  Depression screen PHQ 2/9  Decreased Interest 0 0 0  Down, Depressed, Hopeless 0 0 0  PHQ - 2 Score 0 0 0  Altered sleeping 0    Tired, decreased energy 0    Change in appetite 0    Feeling bad or failure about yourself  0    Trouble concentrating 0    Moving slowly or fidgety/restless 0    Suicidal thoughts 0    PHQ-9 Score 0      History Brenisha has a past medical history of Allergy, Arthritis, ASTHMA (10/06/2007), Bronchial asthma, BURSITIS, RIGHT HIP (02/04/2010), DIABETES MELLITUS, TYPE II (11/01/2009), Family history of adverse reaction to anesthesia, H/O vertigo, History of kidney stones, HYPERLIPIDEMIA (10/06/2007), HYPERTENSION (10/06/2007), Pneumonia, and Restless leg syndrome.   She has a past surgical history that includes Appendectomy; Cholecystectomy; Abdominal hysterectomy; Tubal ligation; Tonsillectomy; Knee arthroscopy (Right); Spinal cord stimulator insertion (N/A, 07/13/2014); Colonoscopy; Cardiac catheterization (1992); and Spine surgery.   Her family history includes Alcoholism in her father; Alzheimer's disease in her mother; Cancer in her father.She reports that she has never smoked. She has never been  exposed to tobacco smoke. She has never used smokeless tobacco. She reports that she does not drink alcohol and does not use drugs.    ROS Review of Systems  Constitutional: Negative.   HENT: Negative.    Eyes:  Negative for visual disturbance.  Respiratory:  Negative for shortness of breath.   Cardiovascular:  Negative for chest pain.  Gastrointestinal:  Negative for abdominal pain.  Musculoskeletal:  Negative for arthralgias.    Objective:  BP 131/68   Pulse 70   Temp (!) 97.4 F (36.3 C) (Oral)   Resp 20   Ht _0  (1.676 m)   Wt 168 lb (76.2 kg)   SpO2 98%   BMI 27.12 kg/m   BP Readings from Last 3 Encounters:  03/17/22 131/68  03/12/22 124/62  02/28/22 (!) 149/62    Wt Readings from Last 3 Encounters:  03/17/22 168 lb (76.2 kg)  03/12/22 169 lb (76.7 kg)  02/27/22 168 lb 10.4 oz (76.5 kg)     Physical Exam Constitutional:      General: She is not in acute distress.    Appearance: She is well-developed.  Cardiovascular:     Rate and Rhythm: Normal rate and regular rhythm.  Pulmonary:     Breath sounds: Normal breath sounds.  Musculoskeletal:        General: Normal range of motion.  Skin:    General: Skin is warm and dry.  Neurological:     Mental Status: She is alert and oriented to person, place,  and time.       Assessment & Plan:   Ramon was seen today for hospitalization follow-up.  Diagnoses and all orders for this visit:  New onset atrial fibrillation (Central) -     CBC with Differential/Platelet -     CMP14+EGFR  Chronic anticoagulation -     CBC with Differential/Platelet -     CMP14+EGFR  Restless legs syndrome (RLS) -     clonazePAM (KLONOPIN) 1 MG tablet; Take 1 tablet (1 mg total) by mouth at bedtime. For restless legs -     CBC with Differential/Platelet -     CMP14+EGFR       I am having Katara Griner. Calix maintain her b complex vitamins, HYDROcodone-acetaminophen, famotidine, atorvastatin, sertraline, fenofibrate,  Glycerin-Polysorbate 80 (REFRESH DRY EYE THERAPY OP), apixaban, metoprolol succinate, and clonazePAM.  Allergies as of 03/17/2022       Reactions   Other Other (See Comments)   Oranges; juice or nuts    Tetracycline Rash, Other (See Comments)   Agitation        Medication List        Accurate as of March 17, 2022  3:19 PM. If you have any questions, ask your nurse or doctor.          atorvastatin 40 MG tablet Commonly known as: LIPITOR Take 1 tablet (40 mg total) by mouth daily.   b complex vitamins tablet Take 1 tablet by mouth daily.   clonazePAM 1 MG tablet Commonly known as: KLONOPIN Take 1 tablet (1 mg total) by mouth at bedtime. For restless legs Start taking on: May 04, 2022 What changed: These instructions start on May 04, 2022. If you are unsure what to do until then, ask your doctor or other care provider. Changed by: Claretta Fraise, MD   Eliquis 5 MG Tabs tablet Generic drug: apixaban Take 1 tablet (5 mg total) by mouth 2 (two) times daily.   famotidine 20 MG tablet Commonly known as: Pepcid Take 1 tablet (20 mg total) by mouth 2 (two) times daily.   fenofibrate 160 MG tablet TAKE 1 TABLET BY MOUTH  DAILY FOR CHOLESTEROL AND  TRIGLYCERIDE What changed: See the new instructions.   HYDROcodone-acetaminophen 10-325 MG tablet Commonly known as: NORCO Take 0.5 tablets by mouth 2 (two) times daily as needed for moderate pain. What changed: when to take this   metoprolol succinate 25 MG 24 hr tablet Commonly known as: Toprol XL Take 1 tablet (25 mg total) by mouth daily.   REFRESH DRY EYE THERAPY OP Place 1-2 drops into both eyes at bedtime.   sertraline 50 MG tablet Commonly known as: Zoloft Take 0.5-1 tablets (25-50 mg total) by mouth daily. What changed: how much to take         Follow-up: Return in about 4 months (around 07/18/2022).  Claretta Fraise, M.D.

## 2022-03-18 LAB — CMP14+EGFR
ALT: 10 IU/L (ref 0–32)
AST: 17 IU/L (ref 0–40)
Albumin/Globulin Ratio: 1.6 (ref 1.2–2.2)
Albumin: 4.1 g/dL (ref 3.8–4.8)
Alkaline Phosphatase: 88 IU/L (ref 44–121)
BUN/Creatinine Ratio: 16 (ref 12–28)
BUN: 13 mg/dL (ref 8–27)
Bilirubin Total: 0.6 mg/dL (ref 0.0–1.2)
CO2: 25 mmol/L (ref 20–29)
Calcium: 9.6 mg/dL (ref 8.7–10.3)
Chloride: 103 mmol/L (ref 96–106)
Creatinine, Ser: 0.8 mg/dL (ref 0.57–1.00)
Globulin, Total: 2.5 g/dL (ref 1.5–4.5)
Glucose: 84 mg/dL (ref 70–99)
Potassium: 4.6 mmol/L (ref 3.5–5.2)
Sodium: 140 mmol/L (ref 134–144)
Total Protein: 6.6 g/dL (ref 6.0–8.5)
eGFR: 75 mL/min/{1.73_m2} (ref >59)

## 2022-03-18 LAB — CBC WITH DIFFERENTIAL/PLATELET
Basophils Absolute: 0.1 10*3/uL (ref 0.0–0.2)
Basos: 1 %
EOS (ABSOLUTE): 0.2 10*3/uL (ref 0.0–0.4)
Eos: 2 %
Hematocrit: 39.8 % (ref 34.0–46.6)
Hemoglobin: 13.1 g/dL (ref 11.1–15.9)
Immature Grans (Abs): 0 10*3/uL (ref 0.0–0.1)
Immature Granulocytes: 0 %
Lymphocytes Absolute: 3.7 10*3/uL — ABNORMAL HIGH (ref 0.7–3.1)
Lymphs: 37 %
MCH: 28.4 pg (ref 26.6–33.0)
MCHC: 32.9 g/dL (ref 31.5–35.7)
MCV: 86 fL (ref 79–97)
Monocytes Absolute: 0.6 10*3/uL (ref 0.1–0.9)
Monocytes: 6 %
Neutrophils Absolute: 5.4 10*3/uL (ref 1.4–7.0)
Neutrophils: 54 %
Platelets: 238 10*3/uL (ref 150–450)
RBC: 4.61 x10E6/uL (ref 3.77–5.28)
RDW: 12.8 % (ref 11.7–15.4)
WBC: 10 10*3/uL (ref 3.4–10.8)

## 2022-03-22 NOTE — Progress Notes (Signed)
Hello Stephanie Gonzalez,  Your lab result is normal and/or stable.Some minor variations that are not significant are commonly marked abnormal, but do not represent any medical problem for you.  Best regards, Claretta Fraise, M.D.

## 2022-03-25 ENCOUNTER — Other Ambulatory Visit (HOSPITAL_COMMUNITY): Payer: Self-pay

## 2022-03-26 ENCOUNTER — Telehealth (HOSPITAL_COMMUNITY): Payer: Self-pay | Admitting: Emergency Medicine

## 2022-03-26 NOTE — Telephone Encounter (Signed)
Reaching out to patient to offer assistance regarding upcoming cardiac imaging study; pt verbalizes understanding of appt date/time, parking situation and where to check in, pre-test NPO status and medications ordered, and verified current allergies; name and call back number provided for further questions should they arise Starling Jessie RN Navigator Cardiac Imaging  [facility] [phone] office [phone] cell 

## 2022-03-27 ENCOUNTER — Ambulatory Visit (HOSPITAL_COMMUNITY)
Admission: RE | Admit: 2022-03-27 | Discharge: 2022-03-27 | Disposition: A | Discharge status: Home / Self Care | Payer: Medicare Other | Source: Ambulatory Visit | Attending: Student | Admitting: Student

## 2022-03-27 DIAGNOSIS — I480 Paroxysmal atrial fibrillation: Secondary | ICD-10-CM | POA: Diagnosis present

## 2022-03-27 DIAGNOSIS — I700 Atherosclerosis of aorta: Secondary | ICD-10-CM | POA: Diagnosis not present

## 2022-03-27 DIAGNOSIS — M25512 Pain in left shoulder: Secondary | ICD-10-CM | POA: Insufficient documentation

## 2022-03-27 DIAGNOSIS — E785 Hyperlipidemia, unspecified: Secondary | ICD-10-CM | POA: Diagnosis present

## 2022-03-27 MED ORDER — NITROGLYCERIN 0.4 MG SL SUBL
0.8000 mg | SUBLINGUAL_TABLET | Freq: Once | SUBLINGUAL | Status: AC
  Administered 2022-03-27: 0.8 mg via SUBLINGUAL

## 2022-03-27 MED ORDER — NITROGLYCERIN 0.4 MG SL SUBL
SUBLINGUAL_TABLET | SUBLINGUAL | Status: AC
  Filled 2022-03-27: qty 2

## 2022-03-27 MED ORDER — IOHEXOL 350 MG/ML SOLN
95.0000 mL | Freq: Once | INTRAVENOUS | Status: AC | PRN
  Administered 2022-03-27: 95 mL via INTRAVENOUS

## 2022-03-28 ENCOUNTER — Other Ambulatory Visit: Payer: Self-pay

## 2022-03-28 ENCOUNTER — Telehealth: Payer: Self-pay | Admitting: Student

## 2022-03-28 DIAGNOSIS — Z79899 Other long term (current) drug therapy: Secondary | ICD-10-CM

## 2022-03-28 MED ORDER — ATORVASTATIN CALCIUM 80 MG PO TABS: 80.0000 mg | ORAL_TABLET | Freq: Every day | ORAL | 3 refills | Status: DC

## 2022-03-28 NOTE — Telephone Encounter (Signed)
Pt c/o BP issue: STAT if pt c/o blurred vision, one-sided weakness or slurred speech  1. What are your last 5 BP readings?  139/88 - now  88/59 - 20 minutes ago   2. Are you having any other symptoms (ex. Dizziness, headache, blurred vision, passed out)? No   3. What is your BP issue? Pt calling a little concerned with her bp and hr. Pt would also like someone to review her CT results

## 2022-03-28 NOTE — Telephone Encounter (Addendum)
Patient stated she was concerned over BP readings 88/59 to 139/88. Reviewed with patient the procedure for taking BP and to check it 1-2 hours after taking BP med. She verbalized understanding.  Patient informed of CCTA results per Virgie Dad, PA-C: "Please notify patient of results: Coronary CTA showed only minimal CAD but no significant blockages that are limiting blood flow to the heart muscle which is good. Her LDL (bad cholesterol) was 83 in 10/2021. LDL goal <70 given minimal CAD noted on CTA. Therefore, I would recommend we increase her Lipitor to 80mg  daily and then repeat lipid panel and LFTs in 2 months.   CTA also incidentally showed 2 small pulmonary nodules. I would just have patient follow-up with PCP on this to determine if any routine follow-up imaging is needed for this."  Patient verbalized understanding and will contact PCP for an appointment regarding pulmonary nodules. Order placed for atorvastatin 80mg  daily (patient will use her 40mg  tablets, taking 2 a day until gone). Order placed for fasting lipid and LFT for Dec. 22. Mailed lab slip to patient.

## 2022-03-31 ENCOUNTER — Telehealth: Payer: Self-pay | Admitting: Cardiovascular Disease

## 2022-03-31 DIAGNOSIS — I4891 Unspecified atrial fibrillation: Secondary | ICD-10-CM

## 2022-03-31 MED ORDER — APIXABAN 5 MG PO TABS: 5.0000 mg | ORAL_TABLET | Freq: Two times a day (BID) | ORAL | 1 refills | Status: DC

## 2022-03-31 MED ORDER — METOPROLOL SUCCINATE ER 25 MG PO TB24: 25.0000 mg | ORAL_TABLET | Freq: Every day | ORAL | 3 refills | Status: DC

## 2022-03-31 NOTE — Telephone Encounter (Signed)
Pt c/o medication issue:  1. Name of Medication: apixaban (ELIQUIS) 5 MG TABS tablet  2. How are you currently taking this medication (dosage and times per day)? As prescribed  3. Are you having a reaction (difficulty breathing--STAT)?   4. What is your medication issue? Pt is requesting call back to get advice on this medication until her refill is mailed to her. She would like to know if she should take 1 a day until she gets the other in the mail or if something should also be called into local pharmacy until her other is mailed. Requesting call back.

## 2022-03-31 NOTE — Telephone Encounter (Signed)
*  STAT* If patient is at the pharmacy, call can be transferred to refill team.   1. Which medications need to be refilled? (please list name of each medication and dose if known) apixaban (ELIQUIS) 5 MG TABS tablet  2. Which pharmacy/location (including street and city if local pharmacy) is medication to be sent to? [facility] ([place], [place]  3. Do they need a 30 day or 90 day supply? 30  Pt is requesting this be sent in asap. Only have enough to last 3 days.

## 2022-03-31 NOTE — Telephone Encounter (Signed)
Patient stated she only has 4 eliquis left and asked if she should take 1 per day. I explained that she needs to take the medication as prescribed. Approval for samples of eliquis 5mg  twice daily from PharmD C. Pavero. Patient's husband will pick up samples tomorrow. Refills for eliquis 5mg  twice daily and metoprolol 25mg  daily sent to preferred pharmacy.

## 2022-03-31 NOTE — Telephone Encounter (Signed)
Prescription refill request for Eliquis received. Indication: Afib  Last office visit:03/12/22 Sarajane Jews)  Scr:  0.80 (03/17/22)  Age: 81 Weight: 76.2kg  Samples left at front desk per Judson Roch, RN to last until mail order is received. Appropriate dose and refill sent to requested pharmacy.

## 2022-04-03 ENCOUNTER — Ambulatory Visit: Payer: Medicare Other | Admitting: Family Medicine

## 2022-04-03 ENCOUNTER — Encounter: Payer: Self-pay | Admitting: Family Medicine

## 2022-04-03 VITALS — BP 142/64 | HR 47 | Temp 98.0°F | Ht 66.0 in | Wt 169.4 lb

## 2022-04-03 DIAGNOSIS — R0602 Shortness of breath: Secondary | ICD-10-CM

## 2022-04-03 DIAGNOSIS — I4891 Unspecified atrial fibrillation: Secondary | ICD-10-CM

## 2022-04-03 DIAGNOSIS — R911 Solitary pulmonary nodule: Secondary | ICD-10-CM

## 2022-04-03 DIAGNOSIS — J4520 Mild intermittent asthma, uncomplicated: Secondary | ICD-10-CM

## 2022-04-03 MED ORDER — APIXABAN 5 MG PO TABS: 5.0000 mg | ORAL_TABLET | Freq: Two times a day (BID) | ORAL | 1 refills | Status: DC

## 2022-04-03 MED ORDER — ALBUTEROL SULFATE HFA 108 (90 BASE) MCG/ACT IN AERS: 2.0000 | INHALATION_SPRAY | Freq: Four times a day (QID) | RESPIRATORY_TRACT | 11 refills | Status: AC | PRN

## 2022-04-03 NOTE — Progress Notes (Signed)
Subjective:  Patient ID: Stephanie Gonzalez, female    DOB: [birth year]  Age: 81 y.o. MRN: [id number]  CC: Referral (pulmonology)   HPI Stephanie Gonzalez presents for occasional asthma attack described as dyspnea with central chest tighness andcant get a god breath. Has inhler, but not using it. Main concern is that might be related to the 2 mm and 3 mm lung nodules found on her CT chest   Atrial fibrillation follow up. Pt. is treated with rate control and anticoagulation. Pt.  denies palpitations, rapid rate, chest pain, dyspnea and edema. There has been no bleeding from nose or gums. Pt. has not noticed blood with urine or stool.  Although there is routine bruising easily, it is not excessive.      04/03/2022    1:48 PM 03/17/2022    2:33 PM 10/22/2021    1:41 PM  Depression screen PHQ 2/9  Decreased Interest 0 0 0  Down, Depressed, Hopeless 0 0 0  PHQ - 2 Score 0 0 0  Altered sleeping  0   Tired, decreased energy  0   Change in appetite  0   Feeling bad or failure about yourself   0   Trouble concentrating  0   Moving slowly or fidgety/restless  0   Suicidal thoughts  0   PHQ-9 Score  0     History Stephanie Gonzalez has a past medical history of Allergy, Arthritis, ASTHMA (10/06/2007), Bronchial asthma, BURSITIS, RIGHT HIP (02/04/2010), DIABETES MELLITUS, TYPE II (11/01/2009), Family history of adverse reaction to anesthesia, H/O vertigo, History of kidney stones, HYPERLIPIDEMIA (10/06/2007), HYPERTENSION (10/06/2007), Pneumonia, and Restless leg syndrome.   She has a past surgical history that includes Appendectomy; Cholecystectomy; Abdominal hysterectomy; Tubal ligation; Tonsillectomy; Knee arthroscopy (Right); Spinal cord stimulator insertion (N/A, 07/13/2014); Colonoscopy; Cardiac catheterization (1992); and Spine surgery.   Her family history includes Alcoholism in her father; Alzheimer's disease in her mother; Cancer in her father.She reports that she has never smoked. She has never been exposed to  tobacco smoke. She has never used smokeless tobacco. She reports that she does not drink alcohol and does not use drugs.    ROS Review of Systems  Constitutional: Negative.   HENT: Negative.    Eyes:  Negative for visual disturbance.  Respiratory:  Negative for shortness of breath.   Cardiovascular:  Negative for chest pain.  Gastrointestinal:  Negative for abdominal pain.  Musculoskeletal:  Negative for arthralgias.    Objective:  BP (!) 142/64   Pulse (!) 47   Temp 98 F (36.7 C)   Ht 5\' 6"  (1.676 m)   Wt 169 lb 6.4 oz (76.8 kg)   SpO2 96%   BMI 27.34 kg/m   BP Readings from Last 3 Encounters:  04/03/22 (!) 142/64  03/27/22 121/70  03/17/22 131/68    Wt Readings from Last 3 Encounters:  04/03/22 169 lb 6.4 oz (76.8 kg)  03/17/22 168 lb (76.2 kg)  03/12/22 169 lb (76.7 kg)     Physical Exam Constitutional:      General: She is not in acute distress.    Appearance: She is well-developed.  Cardiovascular:     Rate and Rhythm: Normal rate and regular rhythm.  Pulmonary:     Breath sounds: Normal breath sounds.  Musculoskeletal:        General: Normal range of motion.  Skin:    General: Skin is warm and dry.  Neurological:     Mental Status: She is alert and  oriented to person, place, and time.       Assessment & Plan:   Stephanie Gonzalez was seen today for referral.  Diagnoses and all orders for this visit:  Mild intermittent asthma without complication -     Ambulatory referral to Pulmonology  Lung nodule -     Ambulatory referral to Pulmonology  Shortness of breath -     Ambulatory referral to Pulmonology  New onset atrial fibrillation (HCC) -     apixaban (ELIQUIS) 5 MG TABS tablet; Take 1 tablet (5 mg total) by mouth 2 (two) times daily.  Other orders -     albuterol (VENTOLIN HFA) 108 (90 Base) MCG/ACT inhaler; Inhale 2 puffs into the lungs every 6 (six) hours as needed for wheezing or shortness of breath.       I am having Stephanie Gonzalez start  on albuterol. I am also having her maintain her b complex vitamins, HYDROcodone-acetaminophen, famotidine, sertraline, fenofibrate, Glycerin-Polysorbate 80 (REFRESH DRY EYE THERAPY OP), clonazePAM, atorvastatin, metoprolol succinate, and apixaban.  Allergies as of 04/03/2022       Reactions   Other Other (See Comments)   Oranges; juice or nuts    Tetracycline Rash, Other (See Comments)   Agitation        Medication List        Accurate as of April 03, 2022  2:35 PM. If you have any questions, ask your nurse or doctor.          albuterol 108 (90 Base) MCG/ACT inhaler Commonly known as: VENTOLIN HFA Inhale 2 puffs into the lungs every 6 (six) hours as needed for wheezing or shortness of breath. Started by: Claretta Fraise, MD   apixaban 5 MG Tabs tablet Commonly known as: ELIQUIS Take 1 tablet (5 mg total) by mouth 2 (two) times daily.   atorvastatin 80 MG tablet Commonly known as: LIPITOR Take 1 tablet (80 mg total) by mouth daily.   b complex vitamins tablet Take 1 tablet by mouth daily.   clonazePAM 1 MG tablet Commonly known as: KLONOPIN Take 1 tablet (1 mg total) by mouth at bedtime. For restless legs Start taking on: May 04, 2022   famotidine 20 MG tablet Commonly known as: Pepcid Take 1 tablet (20 mg total) by mouth 2 (two) times daily.   fenofibrate 160 MG tablet TAKE 1 TABLET BY MOUTH  DAILY FOR CHOLESTEROL AND  TRIGLYCERIDE What changed: See the new instructions.   HYDROcodone-acetaminophen 10-325 MG tablet Commonly known as: NORCO Take 0.5 tablets by mouth 2 (two) times daily as needed for moderate pain. What changed: when to take this   metoprolol succinate 25 MG 24 hr tablet Commonly known as: Toprol XL Take 1 tablet (25 mg total) by mouth daily.   REFRESH DRY EYE THERAPY OP Place 1-2 drops into both eyes at bedtime.   sertraline 50 MG tablet Commonly known as: Zoloft Take 0.5-1 tablets (25-50 mg total) by mouth daily. What changed:  how much to take         Follow-up: No follow-ups on file.  Claretta Fraise, M.D.

## 2022-04-04 ENCOUNTER — Encounter: Payer: Self-pay | Admitting: Cardiovascular Disease

## 2022-04-04 NOTE — Telephone Encounter (Signed)
error 

## 2022-04-18 ENCOUNTER — Telehealth: Payer: Self-pay | Admitting: Family Medicine

## 2022-04-23 NOTE — Telephone Encounter (Signed)
Could you pass this information along to her please?

## 2022-04-24 NOTE — Telephone Encounter (Signed)
SPOKE WITH PATIENT, SHE HAS APPOINTMENT

## 2022-05-06 ENCOUNTER — Other Ambulatory Visit: Payer: Self-pay | Admitting: Family Medicine

## 2022-05-06 DIAGNOSIS — G2581 Restless legs syndrome: Secondary | ICD-10-CM

## 2022-05-12 ENCOUNTER — Telehealth: Payer: Self-pay | Admitting: Family Medicine

## 2022-05-12 ENCOUNTER — Other Ambulatory Visit: Payer: Self-pay | Admitting: Family Medicine

## 2022-05-12 DIAGNOSIS — G2581 Restless legs syndrome: Secondary | ICD-10-CM

## 2022-05-12 MED ORDER — CLONAZEPAM 1 MG PO TABS: 1.0000 mg | ORAL_TABLET | Freq: Every day | ORAL | 0 refills | Status: DC

## 2022-05-12 NOTE — Telephone Encounter (Signed)
Please let the patient know that I sent their prescription to their pharmacy. Thanks, WS 

## 2022-05-12 NOTE — Telephone Encounter (Signed)
Klonopin Rx was sent to [facility] in [place]. Needs to be sent to [facility]. Pt is very upset. Says she has told Dr Darlyn Read a thousand times that she doesn't use [facility] anymore.  Can Rx be transferred today.

## 2022-05-13 NOTE — Telephone Encounter (Signed)
Attempted to contact patient - line busy 

## 2022-05-15 ENCOUNTER — Ambulatory Visit (INDEPENDENT_AMBULATORY_CARE_PROVIDER_SITE_OTHER): Payer: Medicare Other

## 2022-05-15 VITALS — Ht 65.0 in | Wt 165.0 lb

## 2022-05-15 DIAGNOSIS — Z0000 Encounter for general adult medical examination without abnormal findings: Secondary | ICD-10-CM | POA: Diagnosis not present

## 2022-05-15 NOTE — Progress Notes (Signed)
Subjective:   Stephanie Gonzalez is a 81 y.o. female who presents for Medicare Annual (Subsequent) preventive examination. I connected with  SHANEAL BEVILLE on 05/15/22 by a audio enabled telemedicine application and verified that I am speaking with the correct person using two identifiers.  Patient Location: Home  Provider Location: Home Office  I discussed the limitations of evaluation and management by telemedicine. The patient expressed understanding and agreed to proceed.  Review of Systems     Cardiac Risk Factors include: advanced age (>25men, >42 women);diabetes mellitus;dyslipidemia;hypertension     Objective:    Today's Vitals   05/15/22 1123  Weight: 165 lb (74.8 kg)  Height: 5\' 5"  (1.651 m)   Body mass index is 27.46 kg/m.     05/15/2022   11:30 AM 02/28/2022   12:00 AM 02/27/2022    1:39 PM 09/26/2021    1:32 PM 05/10/2021    1:32 PM 05/09/2020    1:29 PM 04/27/2019    1:43 PM  Advanced Directives  Does Patient Have a Medical Advance Directive? Yes No No Yes Yes No Yes  Type of Paramedic of [place];Living will    [place];Living will  Living will  Does patient want to make changes to medical advance directive?       No - Patient declined  Copy of [place] in Chart? No - copy requested    No - copy requested    Would patient like information on creating a medical advance directive?  No - Patient declined    No - Patient declined     Current Medications (verified) Outpatient Encounter Medications as of 05/15/2022  Medication Sig   albuterol (VENTOLIN HFA) 108 (90 Base) MCG/ACT inhaler Inhale 2 puffs into the lungs every 6 (six) hours as needed for wheezing or shortness of breath.   apixaban (ELIQUIS) 5 MG TABS tablet Take 1 tablet (5 mg total) by mouth 2 (two) times daily.   atorvastatin (LIPITOR) 80 MG tablet Take 1 tablet (80 mg total) by mouth daily.   b complex vitamins tablet Take 1 tablet by mouth  daily.   clonazePAM (KLONOPIN) 1 MG tablet Take 1 tablet (1 mg total) by mouth at bedtime. For restless legs   famotidine (PEPCID) 20 MG tablet Take 1 tablet (20 mg total) by mouth 2 (two) times daily.   fenofibrate 160 MG tablet TAKE 1 TABLET BY MOUTH  DAILY FOR CHOLESTEROL AND  TRIGLYCERIDE (Patient taking differently: Take 160 mg by mouth daily.)   Glycerin-Polysorbate 80 (REFRESH DRY EYE THERAPY OP) Place 1-2 drops into both eyes at bedtime.   HYDROcodone-acetaminophen (NORCO) 10-325 MG tablet Take 0.5 tablets by mouth 2 (two) times daily as needed for moderate pain. (Patient taking differently: Take 0.5 tablets by mouth every 8 (eight) hours as needed for moderate pain.)   metoprolol succinate (TOPROL XL) 25 MG 24 hr tablet Take 1 tablet (25 mg total) by mouth daily.   sertraline (ZOLOFT) 50 MG tablet Take 0.5-1 tablets (25-50 mg total) by mouth daily. (Patient taking differently: Take 25 mg by mouth daily.)   No facility-administered encounter medications on file as of 05/15/2022.    Allergies (verified) Other and Tetracycline   History: Past Medical History:  Diagnosis Date   Allergy    Arthritis    ASTHMA 10/06/2007   Bronchial asthma    BURSITIS, RIGHT HIP 02/04/2010   DIABETES MELLITUS, TYPE II 11/01/2009   Family history of adverse  reaction to anesthesia    Mother had n/v   H/O vertigo    History of kidney stones    passed on her own   HYPERLIPIDEMIA 10/06/2007   HYPERTENSION 10/06/2007   Pneumonia    Restless leg syndrome    Past Surgical History:  Procedure Laterality Date   ABDOMINAL HYSTERECTOMY     APPENDECTOMY     CARDIAC CATHETERIZATION  1992   ? 90's   CHOLECYSTECTOMY     COLONOSCOPY     KNEE ARTHROSCOPY Right    SPINAL CORD STIMULATOR INSERTION N/A 07/13/2014   Procedure: LUMBAR SPINAL CORD STIMULATOR INSERTION;  Surgeon: Melina Schools, MD;  Location: [facility];  Service: Orthopedics;  Laterality: N/A;   SPINE SURGERY     TONSILLECTOMY     TUBAL LIGATION      Family History  Problem Relation Age of Onset   Alzheimer's disease Mother    Cancer Father    Alcoholism Father    Breast cancer Neg Hx    Social History   Socioeconomic History   Marital status: Married    Spouse name: Jeneen Rinks   Number of children: 3   Years of education: 13   Highest education level: Some college, no degree  Occupational History    Employer: RETIRED  Tobacco Use   Smoking status: Never    Passive exposure: Never   Smokeless tobacco: Never  Vaping Use   Vaping Use: Never used  Substance and Sexual Activity   Alcohol use: No   Drug use: No   Sexual activity: Not Currently    Birth control/protection: Surgical  Other Topics Concern   Not on file  Social History Narrative   Not on file   Social Determinants of Health   Financial Resource Strain: Low Risk  (05/15/2022)   Overall Financial Resource Strain (CARDIA)    Difficulty of Paying Living Expenses: Not hard at all  Food Insecurity: No Food Insecurity (05/15/2022)   Hunger Vital Sign    Worried About Running Out of Food in the Last Year: Never true    Ran Out of Food in the Last Year: Never true  Transportation Needs: No Transportation Needs (05/15/2022)   PRAPARE - Hydrologist (Medical): No    Lack of Transportation (Non-Medical): No  Physical Activity: Inactive (05/15/2022)   Exercise Vital Sign    Days of Exercise per Week: 0 days    Minutes of Exercise per Session: 0 min  Stress: No Stress Concern Present (05/15/2022)   [place]    Feeling of Stress : Not at all  Social Connections: [place] (05/15/2022)   Social Connection and Isolation Panel [NHANES]    Frequency of Communication with Friends and Family: More than three times a week    Frequency of Social Gatherings with Friends and Family: More than three times a week    Attends Religious Services: More than 4 times per year     Active Member of [employer] or Organizations: Yes    Attends Music therapist: More than 4 times per year    Marital Status: Married    Tobacco Counseling Counseling given: Not Answered   Clinical Intake:  Pre-visit preparation completed: Yes  Pain : No/denies pain     Nutritional Risks: None Diabetes: No  How often do you need to have someone help you when you read instructions, pamphlets, or other written materials from your doctor or  pharmacy?: 1 - Never  Diabetic?yes Nutrition Risk Assessment:  Has the patient had any N/V/D within the last 2 months?  No  Does the patient have any non-healing wounds?  No  Has the patient had any unintentional weight loss or weight gain?  No   Diabetes:  Is the patient diabetic?  No  If diabetic, was a CBG obtained today?  No  Did the patient bring in their glucometer from home?  No  How often do you monitor your CBG's? Every other day .   Financial Strains and Diabetes Management:  Are you having any financial strains with the device, your supplies or your medication? No .  Does the patient want to be seen by Chronic Care Management for management of their diabetes?  No  Would the patient like to be referred to a Nutritionist or for Diabetic Management?  No   Diabetic Exams:  Diabetic Eye Exam: Completed 03/2022 Diabetic Foot Exam: Overdue, Pt has been advised about the importance in completing this exam. Pt is scheduled for diabetic foot exam on next office visit .   Interpreter Needed?: No  Information entered by :: Renie Ora, LPN   Activities of Daily Living    05/15/2022   11:29 AM 02/28/2022   12:00 AM  In your present state of health, do you have any difficulty performing the following activities:  Hearing? 0 0  Vision? 0 0  Difficulty concentrating or making decisions? 0 0  Walking or climbing stairs? 0 0  Dressing or bathing? 0 0  Doing errands, shopping? 0 0  Preparing Food and eating ? N   Using  the Toilet? N   In the past six months, have you accidently leaked urine? N   Do you have problems with loss of bowel control? N   Managing your Medications? N   Managing your Finances? N   Housekeeping or managing your Housekeeping? N     Patient Care Team: Mechele Claude, MD as PCP - General (Family Medicine) Croitoru, Rachelle Hora, MD as PCP - Cardiology (Cardiology)  Indicate any recent Medical Services you may have received from other than [facility] providers in the past year (date may be approximate).     Assessment:   This is a routine wellness examination for Kendal.  Hearing/Vision screen Vision Screening - Comments:: Wears rx glasses - up to date with routine eye exams with  Dr. Lelan Pons  Dietary issues and exercise activities discussed: Current Exercise Habits: The patient does not participate in regular exercise at present, Exercise limited by: cardiac condition(s)   Goals Addressed             This Visit's Progress    DIET - INCREASE WATER INTAKE   On track    Try to drink 6-8 glasses of water daily       Depression Screen    05/15/2022   11:27 AM 04/03/2022    1:48 PM 03/17/2022    2:33 PM 10/22/2021    1:41 PM 05/10/2021    1:23 PM 04/24/2021    8:18 AM 10/29/2020    3:06 PM  PHQ 2/9 Scores  PHQ - 2 Score 0 0 0 0 0 0 0  PHQ- 9 Score 0  0        Fall Risk    05/15/2022   11:26 AM 04/03/2022    1:48 PM 03/17/2022    2:33 PM 10/22/2021    1:41 PM 05/10/2021    1:25 PM  Fall Risk  Falls in the past year? 0 0 0 0 0  Number falls in past yr: 0    0  Injury with Fall? 0    0  Risk for fall due to : No Fall Risks    No Fall Risks  Follow up Falls prevention discussed  Falls evaluation completed  Falls prevention discussed    FALL RISK PREVENTION PERTAINING TO THE HOME:  Any stairs in or around the home? Yes  If so, are there any without handrails? No  Home free of loose throw rugs in walkways, pet beds, electrical cords, etc? Yes  Adequate lighting in your home  to reduce risk of falls? Yes   ASSISTIVE DEVICES UTILIZED TO PREVENT FALLS:  Life alert? No  Use of a cane, walker or w/c? No  Grab bars in the bathroom? Yes  Shower chair or bench in shower? Yes  Elevated toilet seat or a handicapped toilet? No        05/15/2022   11:30 AM 05/09/2020    1:30 PM 04/27/2019    1:48 PM  6CIT Screen  What Year? 0 points 0 points 0 points  What month? 0 points 0 points 0 points  What time? 0 points 0 points 0 points  Count back from 20 0 points 0 points 0 points  Months in reverse 0 points 0 points 0 points  Repeat phrase 0 points 0 points 0 points  Total Score 0 points 0 points 0 points    Immunizations Immunization History  Administered Date(s) Administered   Fluad Quad(high Dose 65+) 04/07/2019, 04/04/2020, 04/11/2021, 03/17/2022   Influenza Split 04/16/2012   Influenza Whole 03/24/2007, 03/09/2008, 03/06/2009, 02/04/2010, 02/18/2011   Influenza, High Dose Seasonal PF 03/26/2016, 03/24/2017, 03/23/2018   Influenza,inj,Quad PF,6+ Mos 04/04/2013, 04/10/2014, 03/09/2015   Influenza,inj,quad, With Preservative 03/09/2017   PFIZER Comirnaty(Gray Top)Covid-19 Tri-Sucrose Vaccine 07/19/2019, 08/16/2019, 06/06/2020   Pneumococcal Conjugate-13 06/13/2014   Pneumococcal Polysaccharide-23 04/02/2010   Td 04/02/2010   Zoster Recombinat (Shingrix) 10/22/2021   Zoster, Live 02/18/2011    TDAP status: Up to date  Flu Vaccine status: Up to date  Pneumococcal vaccine status: Up to date  Covid-19 vaccine status: Completed vaccines  Qualifies for Shingles Vaccine? Yes   Zostavax completed Yes   Shingrix Completed?: Yes  Screening Tests Health Maintenance  Topic Date Due   DTaP/Tdap/Td (2 - Tdap) 04/02/2020   FOOT EXAM  08/07/2021   Zoster Vaccines- Shingrix (2 of 2) 12/17/2021   HEMOGLOBIN A1C  04/24/2022   OPHTHALMOLOGY EXAM  08/05/2022   Diabetic kidney evaluation - Urine ACR  10/23/2022   Diabetic kidney evaluation - GFR measurement   03/18/2023   Medicare Annual Wellness (AWV)  05/16/2023   Pneumonia Vaccine 46+ Years old  Completed   INFLUENZA VACCINE  Completed   DEXA SCAN  Completed   HPV VACCINES  Aged Out   COVID-19 Vaccine  Discontinued    Health Maintenance  Health Maintenance Due  Topic Date Due   DTaP/Tdap/Td (2 - Tdap) 04/02/2020   FOOT EXAM  08/07/2021   Zoster Vaccines- Shingrix (2 of 2) 12/17/2021   HEMOGLOBIN A1C  04/24/2022    Colorectal cancer screening: No longer required.   Mammogram status: No longer required due to age.  Bone Density status: Completed 07/25/2016. Results reflect: Bone density results: OSTEOPENIA. Repeat every 5 years.  Lung Cancer Screening: (Low Dose CT Chest recommended if Age 73-80 years, 30 pack-year currently smoking OR have quit w/in 15years.) does not qualify.  Lung Cancer Screening Referral: n/a  Additional Screening:  Hepatitis C Screening: does not qualify;   Vision Screening: Recommended annual ophthalmology exams for early detection of glaucoma and other disorders of the eye. Is the patient up to date with their annual eye exam?  Yes  Who is the provider or what is the name of the office in which the patient attends annual eye exams? Dr. Champ Mungo  If pt is not established with a provider, would they like to be referred to a provider to establish care? No .   Dental Screening: Recommended annual dental exams for proper oral hygiene  Community Resource Referral / Chronic Care Management: CRR required this visit?  No   CCM required this visit?  No      Plan:     I have personally reviewed and noted the following in the patient's chart:   Medical and social history Use of alcohol, tobacco or illicit drugs  Current medications and supplements including opioid prescriptions. Patient is not currently taking opioid prescriptions. Functional ability and status Nutritional status Physical activity Advanced directives List of other  physicians Hospitalizations, surgeries, and ER visits in previous 12 months Vitals Screenings to include cognitive, depression, and falls Referrals and appointments  In addition, I have reviewed and discussed with patient certain preventive protocols, quality metrics, and best practice recommendations. A written personalized care plan for preventive services as well as general preventive health recommendations were provided to patient.     Lorrene Reid, LPN   29/10/1882   Nurse Notes: none

## 2022-05-15 NOTE — Patient Instructions (Signed)
Ms. Koenen , Thank you for taking time to come for your Medicare Wellness Visit. I appreciate your ongoing commitment to your health goals. Please review the following plan we discussed and let me know if I can assist you in the future.   These are the goals we discussed:  Goals      DIET - INCREASE WATER INTAKE     Try to drink 6-8 glasses of water daily     Patient Stated     Less back pain and weight bearing exercise      Patient Stated     05/09/2020 AWV Goal: Exercise for General Health  Patient will verbalize understanding of the benefits of increased physical activity: Exercising regularly is important. It will improve your overall fitness, flexibility, and endurance. Regular exercise also will improve your overall health. It can help you control your weight, reduce stress, and improve your bone density. Over the next year, patient will increase physical activity as tolerated with a goal of at least 150 minutes of moderate physical activity per week.  You can tell that you are exercising at a moderate intensity if your heart starts beating faster and you start breathing faster but can still hold a conversation. Moderate-intensity exercise ideas include: Walking 1 mile (1.6 km) in about 15 minutes Biking Hiking Golfing Dancing Water aerobics Patient will verbalize understanding of everyday activities that increase physical activity by providing examples like the following: Yard work, such as: Sales promotion account executive Gardening Washing windows or floors Patient will be able to explain general safety guidelines for exercising:  Before you start a new exercise program, talk with your health care provider. Do not exercise so much that you hurt yourself, feel dizzy, or get very short of breath. Wear comfortable clothes and wear shoes with good support. Drink plenty of water while you exercise to prevent  dehydration or heat stroke. Work out until your breathing and your heartbeat get faster.      Weight (lb) < 165 lb (74.8 kg)     Continue your walking Drinking more water         This is a list of the screening recommended for you and due dates:  Health Maintenance  Topic Date Due   DTaP/Tdap/Td vaccine (2 - Tdap) 04/02/2020   Complete foot exam   08/07/2021   Zoster (Shingles) Vaccine (2 of 2) 12/17/2021   Hemoglobin A1C  04/24/2022   Eye exam for diabetics  08/05/2022   Yearly kidney health urinalysis for diabetes  10/23/2022   Yearly kidney function blood test for diabetes  03/18/2023   Medicare Annual Wellness Visit  05/16/2023   Pneumonia Vaccine  Completed   Flu Shot  Completed   DEXA scan (bone density measurement)  Completed   HPV Vaccine  Aged Out   COVID-19 Vaccine  Discontinued    Advanced directives: Please bring a copy of your health care power of attorney and living will to the office to be added to your chart at your convenience.   Conditions/risks identified: Aim for 30 minutes of exercise or brisk walking, 6-8 glasses of water, and 5 servings of fruits and vegetables each day.   Next appointment: Follow up in one year for your annual wellness visit    Preventive Care 65 Years and Older, Female Preventive care refers to lifestyle choices and visits with your health care provider that can promote health and wellness. What  does preventive care include? A yearly physical exam. This is also called an annual well check. Dental exams once or twice a year. Routine eye exams. Ask your health care provider how often you should have your eyes checked. Personal lifestyle choices, including: Daily care of your teeth and gums. Regular physical activity. Eating a healthy diet. Avoiding tobacco and drug use. Limiting alcohol use. Practicing safe sex. Taking low-dose aspirin every day. Taking vitamin and mineral supplements as recommended by your health care  provider. What happens during an annual well check? The services and screenings done by your health care provider during your annual well check will depend on your age, overall health, lifestyle risk factors, and family history of disease. Counseling  Your health care provider may ask you questions about your: Alcohol use. Tobacco use. Drug use. Emotional well-being. Home and relationship well-being. Sexual activity. Eating habits. History of falls. Memory and ability to understand (cognition). Work and work Astronomer. Reproductive health. Screening  You may have the following tests or measurements: Height, weight, and BMI. Blood pressure. Lipid and cholesterol levels. These may be checked every 5 years, or more frequently if you are over 17 years old. Skin check. Lung cancer screening. You may have this screening every year starting at age 29 if you have a 30-pack-year history of smoking and currently smoke or have quit within the past 15 years. Fecal occult blood test (FOBT) of the stool. You may have this test every year starting at age 5. Flexible sigmoidoscopy or colonoscopy. You may have a sigmoidoscopy every 5 years or a colonoscopy every 10 years starting at age 61. Hepatitis C blood test. Hepatitis B blood test. Sexually transmitted disease (STD) testing. Diabetes screening. This is done by checking your blood sugar (glucose) after you have not eaten for a while (fasting). You may have this done every 1-3 years. Bone density scan. This is done to screen for osteoporosis. You may have this done starting at age 32. Mammogram. This may be done every 1-2 years. Talk to your health care provider about how often you should have regular mammograms. Talk with your health care provider about your test results, treatment options, and if necessary, the need for more tests. Vaccines  Your health care provider may recommend certain vaccines, such as: Influenza vaccine. This is  recommended every year. Tetanus, diphtheria, and acellular pertussis (Tdap, Td) vaccine. You may need a Td booster every 10 years. Zoster vaccine. You may need this after age 2. Pneumococcal 13-valent conjugate (PCV13) vaccine. One dose is recommended after age 78. Pneumococcal polysaccharide (PPSV23) vaccine. One dose is recommended after age 73. Talk to your health care provider about which screenings and vaccines you need and how often you need them. This information is not intended to replace advice given to you by your health care provider. Make sure you discuss any questions you have with your health care provider. Document Released: 06/22/2015 Document Revised: 02/13/2016 Document Reviewed: 03/27/2015 Elsevier Interactive Patient Education  2017 [employer].  Fall Prevention in the Home Falls can cause injuries. They can happen to people of all ages. There are many things you can do to make your home safe and to help prevent falls. What can I do on the outside of my home? Regularly fix the edges of walkways and driveways and fix any cracks. Remove anything that might make you trip as you walk through a door, such as a raised step or threshold. Trim any bushes or trees on the path  to your home. Use bright outdoor lighting. Clear any walking paths of anything that might make someone trip, such as rocks or tools. Regularly check to see if handrails are loose or broken. Make sure that both sides of any steps have handrails. Any raised decks and porches should have guardrails on the edges. Have any leaves, snow, or ice cleared regularly. Use sand or salt on walking paths during winter. Clean up any spills in your garage right away. This includes oil or grease spills. What can I do in the bathroom? Use night lights. Install grab bars by the toilet and in the tub and shower. Do not use towel bars as grab bars. Use non-skid mats or decals in the tub or shower. If you need to sit down in  the shower, use a plastic, non-slip stool. Keep the floor dry. Clean up any water that spills on the floor as soon as it happens. Remove soap buildup in the tub or shower regularly. Attach bath mats securely with double-sided non-slip rug tape. Do not have throw rugs and other things on the floor that can make you trip. What can I do in the bedroom? Use night lights. Make sure that you have a light by your bed that is easy to reach. Do not use any sheets or blankets that are too big for your bed. They should not hang down onto the floor. Have a firm chair that has side arms. You can use this for support while you get dressed. Do not have throw rugs and other things on the floor that can make you trip. What can I do in the kitchen? Clean up any spills right away. Avoid walking on wet floors. Keep items that you use a lot in easy-to-reach places. If you need to reach something above you, use a strong step stool that has a grab bar. Keep electrical cords out of the way. Do not use floor polish or wax that makes floors slippery. If you must use wax, use non-skid floor wax. Do not have throw rugs and other things on the floor that can make you trip. What can I do with my stairs? Do not leave any items on the stairs. Make sure that there are handrails on both sides of the stairs and use them. Fix handrails that are broken or loose. Make sure that handrails are as long as the stairways. Check any carpeting to make sure that it is firmly attached to the stairs. Fix any carpet that is loose or worn. Avoid having throw rugs at the top or bottom of the stairs. If you do have throw rugs, attach them to the floor with carpet tape. Make sure that you have a light switch at the top of the stairs and the bottom of the stairs. If you do not have them, ask someone to add them for you. What else can I do to help prevent falls? Wear shoes that: Do not have high heels. Have rubber bottoms. Are comfortable  and fit you well. Are closed at the toe. Do not wear sandals. If you use a stepladder: Make sure that it is fully opened. Do not climb a closed stepladder. Make sure that both sides of the stepladder are locked into place. Ask someone to hold it for you, if possible. Clearly mark and make sure that you can see: Any grab bars or handrails. First and last steps. Where the edge of each step is. Use tools that help you move around (mobility aids)  if they are needed. These include: Canes. Walkers. Scooters. Crutches. Turn on the lights when you go into a dark area. Replace any light bulbs as soon as they burn out. Set up your furniture so you have a clear path. Avoid moving your furniture around. If any of your floors are uneven, fix them. If there are any pets around you, be aware of where they are. Review your medicines with your doctor. Some medicines can make you feel dizzy. This can increase your chance of falling. Ask your doctor what other things that you can do to help prevent falls. This information is not intended to replace advice given to you by your health care provider. Make sure you discuss any questions you have with your health care provider. Document Released: 03/22/2009 Document Revised: 11/01/2015 Document Reviewed: 06/30/2014 Elsevier Interactive Patient Education  2017 [employer].

## 2022-05-20 ENCOUNTER — Ambulatory Visit: Payer: Medicare Other | Admitting: Emergency Medicine

## 2022-05-20 ENCOUNTER — Encounter: Payer: Self-pay | Admitting: Emergency Medicine

## 2022-05-20 VITALS — BP 122/68 | HR 54 | Temp 97.9°F | Ht 64.0 in | Wt 169.9 lb

## 2022-05-20 DIAGNOSIS — J4520 Mild intermittent asthma, uncomplicated: Secondary | ICD-10-CM | POA: Diagnosis not present

## 2022-05-20 DIAGNOSIS — R918 Other nonspecific abnormal finding of lung field: Secondary | ICD-10-CM | POA: Insufficient documentation

## 2022-05-20 NOTE — Progress Notes (Signed)
Subjective:    Patient ID: Stephanie Gonzalez, female    DOB: 21-Dec-1940, 81 y.o.   MRN: [id number]  HPI 81 year old never smoker with a history of hypertension, diabetes, allergy/asthma, new dx of atrial fibrillation - in mid October.  She is referred today for her asthma and also pulmonary nodules noted spuriously on CT scan of the chest. She is on metoprolol, eliquis - has had some decreased energy since starting these.  She is not on any scheduled BD/ICS. She never needs albuterol. No wheeze or cough.   Coronary calcium CT chest performed 03/27/2022 and reviewed by me shows calcium score 105 (49%).  2 small pulmonary nodules were seen, 3 mm nodule in the lingula, 2 mm nodule in the right lower lobe.  There was a small amount of right middle lobe subsegmental atelectasis   Review of Systems As per HPI  Past Medical History:  Diagnosis Date   Allergy    Arthritis    ASTHMA 10/06/2007   Bronchial asthma    BURSITIS, RIGHT HIP 02/04/2010   DIABETES MELLITUS, TYPE II 11/01/2009   Family history of adverse reaction to anesthesia    Mother had n/v   H/O vertigo    History of kidney stones    passed on her own   HYPERLIPIDEMIA 10/06/2007   HYPERTENSION 10/06/2007   Pneumonia    Restless leg syndrome      Family History  Problem Relation Age of Onset   Alzheimer's disease Mother    Cancer Father    Alcoholism Father    Breast cancer Neg Hx     Lung CA in her father.   Social History   Socioeconomic History   Marital status: Married    Spouse name: Fayrene Fearing   Number of children: 3   Years of education: 13   Highest education level: Some college, no degree  Occupational History    Employer: RETIRED  Tobacco Use   Smoking status: Never    Passive exposure: Never   Smokeless tobacco: Never  Vaping Use   Vaping Use: Never used  Substance and Sexual Activity   Alcohol use: No   Drug use: No   Sexual activity: Not Currently    Birth control/protection: Surgical  Other Topics  Concern   Not on file  Social History Narrative   Not on file   Social Determinants of Health   Financial Resource Strain: Low Risk  (05/15/2022)   Overall Financial Resource Strain (CARDIA)    Difficulty of Paying Living Expenses: Not hard at all  Food Insecurity: No Food Insecurity (05/15/2022)   Hunger Vital Sign    Worried About Running Out of Food in the Last Year: Never true    Ran Out of Food in the Last Year: Never true  Transportation Needs: No Transportation Needs (05/15/2022)   PRAPARE - Administrator, Civil Service (Medical): No    Lack of Transportation (Non-Medical): No  Physical Activity: Inactive (05/15/2022)   Exercise Vital Sign    Days of Exercise per Week: 0 days    Minutes of Exercise per Session: 0 min  Stress: No Stress Concern Present (05/15/2022)   [employer] of Occupational Health - Occupational Stress Questionnaire    Feeling of Stress : Not at all  Social Connections: Socially Integrated (05/15/2022)   Social Connection and Isolation Panel [NHANES]    Frequency of Communication with Friends and Family: More than three times a week    Frequency of Social  Gatherings with Friends and Family: More than three times a week    Attends Religious Services: More than 4 times per year    Active Member of [employer] or Organizations: Yes    Attends Engineer, structural: More than 4 times per year    Marital Status: Married  Catering manager Violence: Not At Risk (05/15/2022)   Humiliation, Afraid, Rape, and Kick questionnaire    Fear of Current or Ex-Partner: No    Emotionally Abused: No    Physically Abused: No    Sexually Abused: No    [place] She has worked in office, 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  She has dog, cat, no birds.  No hot tub   Allergies  Allergen Reactions   Other Other (See Comments)    Oranges; juice or nuts    Tetracycline Rash and Other (See Comments)    Agitation      Outpatient Medications Prior to Visit  Medication Sig Dispense Refill   albuterol (VENTOLIN HFA) 108 (90 Base) MCG/ACT inhaler Inhale 2 puffs into the lungs every 6 (six) hours as needed for wheezing or shortness of breath. 18 g 11   apixaban (ELIQUIS) 5 MG TABS tablet Take 1 tablet (5 mg total) by mouth 2 (two) times daily. 180 tablet 1   atorvastatin (LIPITOR) 80 MG tablet Take 1 tablet (80 mg total) by mouth daily. 90 tablet 3   b complex vitamins tablet Take 1 tablet by mouth daily.     clonazePAM (KLONOPIN) 1 MG tablet Take 1 tablet (1 mg total) by mouth at bedtime. For restless legs 90 tablet 0   famotidine (PEPCID) 20 MG tablet Take 1 tablet (20 mg total) by mouth 2 (two) times daily. 180 tablet 3   fenofibrate 160 MG tablet TAKE 1 TABLET BY MOUTH  DAILY FOR CHOLESTEROL AND  TRIGLYCERIDE (Patient taking differently: Take 160 mg by mouth daily.) 90 tablet 0   Glycerin-Polysorbate 80 (REFRESH DRY EYE THERAPY OP) Place 1-2 drops into both eyes at bedtime.     HYDROcodone-acetaminophen (NORCO) 10-325 MG tablet Take 0.5 tablets by mouth 2 (two) times daily as needed for moderate pain. (Patient taking differently: Take 0.5 tablets by mouth every 8 (eight) hours as needed for moderate pain.) 30 tablet 0   metoprolol succinate (TOPROL XL) 25 MG 24 hr tablet Take 1 tablet (25 mg total) by mouth daily. 90 tablet 3   sertraline (ZOLOFT) 50 MG tablet Take 0.5-1 tablets (25-50 mg total) by mouth daily. (Patient taking differently: Take 25 mg by mouth daily.) 90 tablet 2   No facility-administered medications prior to visit.         Objective:   Physical Exam Vitals:   05/20/22 1508  BP: 122/68  Pulse: (!) 54  Temp: 97.9 F (36.6 C)  TempSrc: Oral  SpO2: 97%  Weight: 169 lb 14.4 oz (77.1 kg)  Height: 5\' 4"  (1.626 m)   Gen: Pleasant, well-nourished, in no distress,  normal affect  ENT: No lesions,  mouth clear,  oropharynx clear, no postnasal drip  Neck: No JVD, no stridor  Lungs:  No use of accessory muscles, no crackles or wheezing on normal respiration, no wheeze on forced expiration  Cardiovascular: Regular, bradycardic, heart sounds normal, no murmur or gallops, no peripheral edema  Musculoskeletal: No deformities, no cyanosis or clubbing  Neuro: alert, awake, non focal  Skin: Warm, no lesions or rash      Assessment &  Plan:  Pulmonary nodules 2 punctate pulmonary nodules, 2-3 mm, and a low risk patient.  She does have a family history of lung cancer but was a never smoker.  We do not need to perform serial follow-up, these can be deemed benign.  I reassured the patient about this.  Asthma Overall stable.  She does not use albuterol.  Denies any dyspnea.  Her biggest complaint is fatigue and low energy.  This has been true since she was diagnosed with atrial fibrillation.  Question whether it is due either to the A-fib or it sounds more like it may be related to the sensitivity to her beta-blockade.  Her heart rate here today is 54.  Is going to follow-up with cardiology about this.   Levy Pupa, MD, PhD 05/20/2022, 3:39 PM [facility] [phone] or if no answer before 7:00PM call [phone] For any issues after 7:00PM please call eLink [phone]

## 2022-05-20 NOTE — Patient Instructions (Signed)
We reviewed your CT scan of the chest today. There are 2 very small pulmonary nodules on your CT scan.  Based on your low risk profile you do not have to do any more follow-up scans.  These nodules can be deemed to be benign.  Good news. Please follow Dr. Delton Coombes with any questions, problems or concerns, especially if you experience a change in your breathing or asthma symptoms.

## 2022-05-20 NOTE — Assessment & Plan Note (Signed)
2 punctate pulmonary nodules, 2-3 mm, and a low risk patient.  She does have a family history of lung cancer but was a never smoker.  We do not need to perform serial follow-up, these can be deemed benign.  I reassured the patient about this.

## 2022-05-20 NOTE — Assessment & Plan Note (Signed)
Overall stable.  She does not use albuterol.  Denies any dyspnea.  Her biggest complaint is fatigue and low energy.  This has been true since she was diagnosed with atrial fibrillation.  Question whether it is due either to the A-fib or it sounds more like it may be related to the sensitivity to her beta-blockade.  Her heart rate here today is 54.  Is going to follow-up with cardiology about this.

## 2022-07-21 NOTE — Progress Notes (Signed)
Cardiology Office Note:    Date:  07/29/2022   ID:  Stephanie Gonzalez, DOB [birth year], MRN [id number]  PCP:  Claretta Fraise, MD  Cardiologist:  Sanda Klein, MD  Electrophysiologist:  None   Referring MD: Claretta Fraise, MD   Chief Complaint: follow-up of atrial fibrillation  History of Present Illness:    Stephanie Gonzalez is a 82 y.o. female with a history of minimal non-obstructive CAD noted on coronary CTA in 03/2022, paroxysmal atrial fibrillation on Eliquis, hypertension, hyperlipidemia, type 2 diabetes mellitus, vertigo, asthma, restless leg syndrome, and chronic back pain who is followed by Dr. Sallyanne Kuster and presents today for follow-up of atrial fibrillation.  Patient was first seen by Cardiology during admission in 02/2022 for new onset atrial fibrillation with RVR. At that time, she presented with exertional dyspnea, fatigue, and neck/jaw pain and was found to be in atrial fibrillation with rates in the 160s. Patient was started on IV Diltiazem and converted back to normal sinus rhythm. Symptoms resolved with restoration of sinus rhythm. Echo showed LVEF of 60-65% with no regional wall motion abnormalities, normal RV, mild MR, and mildly elevated PASP. She was transitioned to Lopressor and started on Eliquis with plans for outpatient coronary CTA. She was seen by me for follow-up in 03/2022 at which time she was maintaining sinus rhythm with rates in the high 40s. She continued to reported fatigue and weakness. She denied any chest pain or recurrent jaw pain but was continuing to have some right shoulder pain. Discussed stopping beta-blocker completing and switching to Diltiazem instead but patient preferred to switch from Lopressor to Toprol and we cut the dose in half. Coronary CTA was ordered and showed a coronary calcium score of 105 (49th percentile for age and sex) with only minimal non-obstructive CAD.   Patient presents today for follow-up. Here with daughter. Patient's main  complaint today is fatigue and having no energy. She also reported some unsteadiness on her feet due to weakness. She states heart rate as been in the 40s-50s at home. She stopped taking her Eliquis recently due to some bleeding. She reports a couple of nosebleeds and some mild bleeding of her gums. She saw a dentist and it sounds like this may of been due to the need for a couple of dental extractions. She also reports noticing a couple of episodes of mild bleeding on the toilet paper after having a bowel movement but no significant bleeding in urine or stools. She denies any known hemorrhoids. She denies any chest pain. She reports feeling "winded" with ambulation but states this is due to her significant fatigue. No other shortness of breath. No new or worsening orthopnea - she sleeps on 2 pillow which is not new. No PND or edema. She denies any palpitations, lightheadedness, or dizziness. She reports some clear reflux if she eats late at night and then lays down. No other chest discomfort.   Past Medical History:  Diagnosis Date   Allergy    Arthritis    ASTHMA 10/06/2007   Bronchial asthma    BURSITIS, RIGHT HIP 02/04/2010   DIABETES MELLITUS, TYPE II 11/01/2009   Family history of adverse reaction to anesthesia    Mother had n/v   H/O vertigo    History of kidney stones    passed on her own   HYPERLIPIDEMIA 10/06/2007   HYPERTENSION 10/06/2007   Pneumonia    Restless leg syndrome     Past Surgical History:  Procedure Laterality Date  ABDOMINAL HYSTERECTOMY     APPENDECTOMY     CARDIAC CATHETERIZATION  1992   ? 90's   CHOLECYSTECTOMY     COLONOSCOPY     KNEE ARTHROSCOPY Right    SPINAL CORD STIMULATOR INSERTION N/A 07/13/2014   Procedure: LUMBAR SPINAL CORD STIMULATOR INSERTION;  Surgeon: Melina Schools, MD;  Location: [facility];  Service: Orthopedics;  Laterality: N/A;   SPINE SURGERY     TONSILLECTOMY     TUBAL LIGATION      Current Medications: Current Meds  Medication Sig    albuterol (VENTOLIN HFA) 108 (90 Base) MCG/ACT inhaler Inhale 2 puffs into the lungs every 6 (six) hours as needed for wheezing or shortness of breath.   apixaban (ELIQUIS) 5 MG TABS tablet Take 1 tablet (5 mg total) by mouth 2 (two) times daily.   atorvastatin (LIPITOR) 80 MG tablet Take 1 tablet (80 mg total) by mouth daily. (Patient taking differently: Take 40 mg by mouth daily.)   b complex vitamins tablet Take 1 tablet by mouth daily.   clonazePAM (KLONOPIN) 1 MG tablet Take 1 tablet (1 mg total) by mouth at bedtime. For restless legs   famotidine (PEPCID) 20 MG tablet Take 1 tablet (20 mg total) by mouth 2 (two) times daily.   fenofibrate 160 MG tablet TAKE 1 TABLET BY MOUTH  DAILY FOR CHOLESTEROL AND  TRIGLYCERIDE (Patient taking differently: Take 160 mg by mouth daily.)   Glycerin-Polysorbate 80 (REFRESH DRY EYE THERAPY OP) Place 1-2 drops into both eyes at bedtime.   HYDROcodone-acetaminophen (NORCO) 10-325 MG tablet Take 0.5 tablets by mouth 2 (two) times daily as needed for moderate pain. (Patient taking differently: Take 0.5 tablets by mouth every 8 (eight) hours as needed for moderate pain.)   sertraline (ZOLOFT) 50 MG tablet Take 0.5-1 tablets (25-50 mg total) by mouth daily. (Patient taking differently: Take 25 mg by mouth daily.)   [DISCONTINUED] metoprolol succinate (TOPROL XL) 25 MG 24 hr tablet Take 1 tablet (25 mg total) by mouth daily.     Allergies:   Other and Tetracycline   Social History   Socioeconomic History   Marital status: Married    Spouse name: Jeneen Rinks   Number of children: 3   Years of education: 13   Highest education level: Some college, no degree  Occupational History    Employer: RETIRED  Tobacco Use   Smoking status: Never    Passive exposure: Never   Smokeless tobacco: Never  Vaping Use   Vaping Use: Never used  Substance and Sexual Activity   Alcohol use: No   Drug use: No   Sexual activity: Not Currently    Birth control/protection: Surgical   Other Topics Concern   Not on file  Social History Narrative   Not on file   Social Determinants of Health   Financial Resource Strain: Low Risk  (05/15/2022)   Overall Financial Resource Strain (CARDIA)    Difficulty of Paying Living Expenses: Not hard at all  Food Insecurity: No Food Insecurity (05/15/2022)   Hunger Vital Sign    Worried About Running Out of Food in the Last Year: Never true    [place] in the Last Year: Never true  Transportation Needs: No Transportation Needs (05/15/2022)   PRAPARE - Hydrologist (Medical): No    Lack of Transportation (Non-Medical): No  Physical Activity: Inactive (05/15/2022)   Exercise Vital Sign    Days of Exercise per Week: 0 days  Minutes of Exercise per Session: 0 min  Stress: No Stress Concern Present (05/15/2022)   [place]    Feeling of Stress : Not at all  Social Connections: [place] (05/15/2022)   Social Connection and Isolation Panel [NHANES]    Frequency of Communication with Friends and Family: More than three times a week    Frequency of Social Gatherings with Friends and Family: More than three times a week    Attends Religious Services: More than 4 times per year    Active Member of [employer] or Organizations: Yes    Attends Music therapist: More than 4 times per year    Marital Status: Married     Family History: The patient's family history includes Alcoholism in her father; Alzheimer's disease in her mother; Cancer in her father. There is no history of Breast cancer.  ROS:   Please see the history of present illness.     EKGs/Labs/Other Studies Reviewed:    The following studies were reviewed:  Echocardiogram 02/28/2022: Impressions:  1. Left ventricular ejection fraction, by estimation, is 60 to 65%. The  left ventricle has normal function. The left ventricle has no regional  wall motion  abnormalities. There is mild left ventricular hypertrophy.  Left ventricular diastolic parameters  are indeterminate.   2. Right ventricular systolic function is normal. The right ventricular  size is normal. There is mildly elevated pulmonary artery systolic  pressure.   3. The mitral valve is normal in structure. Mild mitral valve  regurgitation. No evidence of mitral stenosis.   4. The aortic valve is normal in structure. Aortic valve regurgitation is  not visualized. Aortic valve sclerosis is present, with no evidence of  aortic valve stenosis.   5. The inferior vena cava is normal in size with greater than 50%  respiratory variability, suggesting right atrial pressure of 3 mmHg.  _______________  Coronary CTA 03/27/2022: Impression: 1. Minimal mixed non-obstructive CAD, CADRADS = 1. 2. Coronary calcium score of 105. This was 49th percentile for age and sex matched control. 3. Normal coronary origin with left dominance. 4. Consider non-coronary causes of shoulder pain. 5. Posterior mitral annular calcification. 6. Aortic atherosclerosis.  EKG:  EKG not ordered today.   Recent Labs: 02/27/2022: B Natriuretic Peptide 135.9; Magnesium 2.1; TSH 1.112 03/17/2022: ALT 10; BUN 13; Creatinine, Ser 0.80; Hemoglobin 13.1; Platelets 238; Potassium 4.6; Sodium 140  Recent Lipid Panel    Component Value Date/Time   CHOL 174 10/22/2021 1353   TRIG 207 (H) 10/22/2021 1353   HDL 57 10/22/2021 1353   CHOLHDL 3.1 10/22/2021 1353   CHOLHDL 5 08/04/2017 0859   VLDL 46.6 (H) 08/04/2017 0859   LDLCALC 83 10/22/2021 1353   LDLDIRECT 95.0 08/04/2017 0859    Physical Exam:    Vital Signs: BP (!) 122/58   Pulse 63   Ht 5' 4"$  (1.626 m)   Wt 170 lb 9.6 oz (77.4 kg)   SpO2 96%   BMI 29.28 kg/m     Wt Readings from Last 3 Encounters:  07/29/22 170 lb 9.6 oz (77.4 kg)  05/20/22 169 lb 14.4 oz (77.1 kg)  05/15/22 165 lb (74.8 kg)     General: 82 y.o. Caucasian female in no acute  distress. HEENT: Normocephalic and atraumatic. Sclera clear.  Neck: Supple. No carotid bruits. No JVD. Heart: RRR. Distinct S1 and S2. No murmurs, gallops, or rubs. Radial pulses 2+ and equal bilaterally. Lungs: No increased  work of breathing. Clear to ausculation bilaterally. No wheezes, rhonchi, or rales.  Abdomen: Soft, non-distended, and non-tender to palpation.  Extremities: No lower extremity edema.    Skin: Warm and dry. Neuro: Alert and oriented x3. No focal deficits. Psych: Normal affect. Responds appropriately.  Assessment:    1. Other fatigue   2. Paroxysmal atrial fibrillation (HCC)   3. Non-obstructive CAD   4. Primary hypertension   5. Hyperlipidemia, unspecified hyperlipidemia type   6. Type 2 diabetes mellitus with complication, without long-term current use of insulin (HCC)   7. Pre-op evaluation     Plan:    Fatigue Patient main complaint is fatigue and having "no energy." She reports having a heart rate in the 40s to 50s at home but heart rates are in the low 60s in the office today. - Will stop Toprol-XL.  - Advised patient to let us know if fatigue does not improve with this.   Paroxysmal Atrial Fibrillation Initially diagnosed in 02/2022 but converted with IV Diltiazem. Echo at that time showed normal LV function.  - Maintaining sinus rhythm on exam.  - Will stop Toprol-XL due to bradycardia and significant fatigue. - She has not been taking her Eliquis due to some mild bleeding (see HPI for more information). Discussed the importance of Eliquis and minimizes stroke risk and recommended patient restart Eliquis 23m twice daily which she is in agreement with. Will check BMET and CBC today.  - Advised patient to let [place] if she has any significant bleeding after restarting Eliquis.   Non-Obstructive CAD Coronary CTA in 03/2022 showed a  coronary calcium score of 105 (49th percentile for age and sex) with only minimal non-obstructive CAD.  - No angina. - No  aspirin due to need for Eliquis. - Continue statin.   Hypertension BP initially mildly elevated at 144/72 but improved to 122/58 on my personal recheck at home. - Will stop Toprol-XL due to reports of bradycardia and fatigue. - Advised patient to monitor her BP after stopping the Toprol-XL and notify u[place]oreaif consistently >130/80.   Hyperlipidemia Most recent lipid panel in 10/2021: Total Cholesterol 174, Triglycerides 207, HDL 57, LDL 83.  - Lipitor was increased to 848mdaily in 03/2022. Continue. - Patient is due for repeat labs. She is not fasting today so she will come back for repeat lipid panel and LFTs.   Type 2 Diabetes Mellitus History of type 2 diabetes mellitus listed in chart but not on any medications.  - Hemoglobin A1c 4.7% in 10/2021. - Management per PCP.  Pre-Op Evaluation Patient may need a couple of dental extractions in the future. Simple dental extractions are considered low risk procedures per guidelines and generally do not require any specific cardiac clearance. SBE prophylaxis is not required from a cardiac standpoint. If only 1-2 teeth are going to be extracted, Eliquis can be continued perioperatively.  However, If more teeth extractions are needed may need to hold Eliquis for a day or so. Asked patient to have dentist send [place] clearance from so we can provide for better recommendations for holding Eliquis.   Disposition: Follow up in 6 months.   Medication Adjustments/Labs and Tests Ordered: Current medicines are reviewed at length with the patient today.  Concerns regarding medicines are outlined above.  Orders Placed This Encounter  Procedures   Basic metabolic panel   CBC   Lipid panel   Hepatic function panel   Meds ordered this encounter  Medications   apixaban (ELIQUIS)  5 MG TABS tablet    Sig: Take 1 tablet (5 mg total) by mouth 2 (two) times daily.    Dispense:  180 tablet    Refill:  3    Patient Instructions  Medication Instructions:   STOP taking Metoprolol Succinate Restart Eliquis (Apixaban) 9m twice a day *If you need a refill on your cardiac medications before your next appointment, please call your pharmacy*   Lab Work: BMET and CBC today Fasting Lipid and Liver panel- In a few months If you have labs (blood work) drawn today and your tests are completely normal, you will receive your results only by: [place](if you have MyChart) OR A paper copy in the mail If you have any lab test that is abnormal or we need to change your treatment, we will call you to review the results.  Follow-Up: [facility] and your [facility] needs are our priority.  As part of our continuing mission to provide you with exceptional heart care, we have created designated Provider Care Teams.  These Care Teams include your primary Cardiologist (physician) and Advanced Practice Providers (APPs -  Physician Assistants and Nurse Practitioners) who all work together to provide you with the care you need, when you need it.  We recommend signing up for the patient portal called "MyChart".  Sign up information is provided on this After Visit Summary.  MyChart is used to connect with patients for Virtual Visits (Telemedicine).  Patients are able to view lab/test results, encounter notes, upcoming appointments, etc.  Non-urgent messages can be sent to your provider as well.   To learn more about what you can do with MyChart, go to [email or web address]    Your next appointment:   6 month(s)  Provider:   MSanda Klein MD  or CSande Rives PA-C     Signed, CDarreld Mclean PA-C  07/29/2022 9:11 PM    [facility]

## 2022-07-29 ENCOUNTER — Encounter: Payer: Self-pay | Admitting: Student

## 2022-07-29 ENCOUNTER — Ambulatory Visit: Payer: Medicare Other | Attending: Student | Admitting: Student

## 2022-07-29 VITALS — BP 122/58 | HR 63 | Ht 64.0 in | Wt 170.6 lb

## 2022-07-29 DIAGNOSIS — I10 Essential (primary) hypertension: Secondary | ICD-10-CM

## 2022-07-29 DIAGNOSIS — E118 Type 2 diabetes mellitus with unspecified complications: Secondary | ICD-10-CM

## 2022-07-29 DIAGNOSIS — I2510 Atherosclerotic heart disease of native coronary artery without angina pectoris: Secondary | ICD-10-CM

## 2022-07-29 DIAGNOSIS — I480 Paroxysmal atrial fibrillation: Secondary | ICD-10-CM | POA: Diagnosis not present

## 2022-07-29 DIAGNOSIS — Z01818 Encounter for other preprocedural examination: Secondary | ICD-10-CM

## 2022-07-29 DIAGNOSIS — E785 Hyperlipidemia, unspecified: Secondary | ICD-10-CM

## 2022-07-29 DIAGNOSIS — R5383 Other fatigue: Secondary | ICD-10-CM

## 2022-07-29 MED ORDER — APIXABAN 5 MG PO TABS: 5.0000 mg | ORAL_TABLET | Freq: Two times a day (BID) | ORAL | 3 refills | Status: DC

## 2022-07-29 NOTE — Patient Instructions (Signed)
Medication Instructions:  STOP taking Metoprolol Succinate Restart Eliquis (Apixaban) 32m twice a day *If you need a refill on your cardiac medications before your next appointment, please call your pharmacy*   Lab Work: BMET and CBC today Fasting Lipid and Liver panel- In a few months If you have labs (blood work) drawn today and your tests are completely normal, you will receive your results only by: [place](if you have MyChart) OR A paper copy in the mail If you have any lab test that is abnormal or we need to change your treatment, we will call you to review the results.  Follow-Up: [facility] you and your health needs are our priority.  As part of our continuing mission to provide you with exceptional heart care, we have created designated Provider Care Teams.  These Care Teams include your primary Cardiologist (physician) and Advanced Practice Providers (APPs -  Physician Assistants and Nurse Practitioners) who all work together to provide you with the care you need, when you need it.  We recommend signing up for the patient portal called "MyChart".  Sign up information is provided on this After Visit Summary.  MyChart is used to connect with patients for Virtual Visits (Telemedicine).  Patients are able to view lab/test results, encounter notes, upcoming appointments, etc.  Non-urgent messages can be sent to your provider as well.   To learn more about what you can do with MyChart, go to [email or web address]    Your next appointment:   6 month(s)  Provider:   MSanda Klein MD  or CSande Rives PA-C

## 2022-07-30 LAB — CBC
Hematocrit: 40 % (ref 34.0–46.6)
Hemoglobin: 12.9 g/dL (ref 11.1–15.9)
MCH: 28.6 pg (ref 26.6–33.0)
MCHC: 32.3 g/dL (ref 31.5–35.7)
MCV: 89 fL (ref 79–97)
Platelets: 243 10*3/uL (ref 150–450)
RBC: 4.51 x10E6/uL (ref 3.77–5.28)
RDW: 12.5 % (ref 11.7–15.4)
WBC: 10.2 10*3/uL (ref 3.4–10.8)

## 2022-07-30 LAB — BASIC METABOLIC PANEL
BUN/Creatinine Ratio: 15 (ref 12–28)
BUN: 15 mg/dL (ref 8–27)
CO2: 25 mmol/L (ref 20–29)
Calcium: 9.4 mg/dL (ref 8.7–10.3)
Chloride: 101 mmol/L (ref 96–106)
Creatinine, Ser: 0.98 mg/dL (ref 0.57–1.00)
Glucose: 99 mg/dL (ref 70–99)
Potassium: 5.1 mmol/L (ref 3.5–5.2)
Sodium: 141 mmol/L (ref 134–144)
eGFR: 58 mL/min/{1.73_m2} — ABNORMAL LOW (ref >59)

## 2022-08-04 ENCOUNTER — Other Ambulatory Visit: Payer: Self-pay | Admitting: Family Medicine

## 2022-08-04 DIAGNOSIS — G2581 Restless legs syndrome: Secondary | ICD-10-CM

## 2022-08-04 NOTE — Telephone Encounter (Signed)
Last OV 04/03/22. Last RF 05/12/22 #90 and 0 refills. Next OV not scheduled  Does pt ntbs?

## 2022-08-08 ENCOUNTER — Emergency Department (HOSPITAL_COMMUNITY): Payer: Medicare Other

## 2022-08-08 ENCOUNTER — Encounter (HOSPITAL_COMMUNITY): Payer: Self-pay | Admitting: Emergency Medicine

## 2022-08-08 ENCOUNTER — Other Ambulatory Visit: Payer: Self-pay

## 2022-08-08 ENCOUNTER — Emergency Department (HOSPITAL_COMMUNITY)
Admission: EM | Admit: 2022-08-08 | Discharge: 2022-08-08 | Disposition: A | Discharge status: Home / Self Care | Payer: Medicare Other | Attending: Emergency Medicine | Admitting: Emergency Medicine

## 2022-08-08 DIAGNOSIS — I4891 Unspecified atrial fibrillation: Secondary | ICD-10-CM | POA: Diagnosis not present

## 2022-08-08 DIAGNOSIS — R531 Weakness: Secondary | ICD-10-CM | POA: Diagnosis present

## 2022-08-08 LAB — COMPREHENSIVE METABOLIC PANEL
ALT: 11 U/L (ref 0–44)
AST: 19 U/L (ref 15–41)
Albumin: 3.6 g/dL (ref 3.5–5.0)
Alkaline Phosphatase: 62 U/L (ref 38–126)
Anion gap: 6 (ref 5–15)
BUN: 17 mg/dL (ref 8–23)
CO2: 25 mmol/L (ref 22–32)
Calcium: 8.9 mg/dL (ref 8.9–10.3)
Chloride: 105 mmol/L (ref 98–111)
Creatinine, Ser: 0.89 mg/dL (ref 0.44–1.00)
GFR, Estimated: >60 mL/min (ref >60)
Glucose, Bld: 138 mg/dL — ABNORMAL HIGH (ref 70–99)
Potassium: 3.4 mmol/L — ABNORMAL LOW (ref 3.5–5.1)
Sodium: 136 mmol/L (ref 135–145)
Total Bilirubin: 0.5 mg/dL (ref 0.3–1.2)
Total Protein: 6.5 g/dL (ref 6.5–8.1)

## 2022-08-08 LAB — CBC WITH DIFFERENTIAL/PLATELET
Abs Immature Granulocytes: 0.03 10*3/uL (ref 0.00–0.07)
Basophils Absolute: 0.1 10*3/uL (ref 0.0–0.1)
Basophils Relative: 1 %
Eosinophils Absolute: 0.5 10*3/uL (ref 0.0–0.5)
Eosinophils Relative: 5 %
HCT: 39.5 % (ref 36.0–46.0)
Hemoglobin: 12.9 g/dL (ref 12.0–15.0)
Immature Granulocytes: 0 %
Lymphocytes Relative: 38 %
Lymphs Abs: 3.7 10*3/uL (ref 0.7–4.0)
MCH: 28.9 pg (ref 26.0–34.0)
MCHC: 32.7 g/dL (ref 30.0–36.0)
MCV: 88.4 fL (ref 80.0–100.0)
Monocytes Absolute: 0.7 10*3/uL (ref 0.1–1.0)
Monocytes Relative: 8 %
Neutro Abs: 4.8 10*3/uL (ref 1.7–7.7)
Neutrophils Relative %: 48 %
Platelets: 263 10*3/uL (ref 150–400)
RBC: 4.47 MIL/uL (ref 3.87–5.11)
RDW: 12.9 % (ref 11.5–15.5)
WBC: 9.8 10*3/uL (ref 4.0–10.5)
nRBC: 0 % (ref 0.0–0.2)

## 2022-08-08 LAB — TROPONIN I (HIGH SENSITIVITY)
Troponin I (High Sensitivity): 10 ng/L (ref <18)
Troponin I (High Sensitivity): 19 ng/L — ABNORMAL HIGH (ref <18)

## 2022-08-08 MED ORDER — DILTIAZEM HCL 30 MG PO TABS: 30.0000 mg | ORAL_TABLET | Freq: Two times a day (BID) | ORAL | 0 refills | Status: DC

## 2022-08-08 MED ORDER — DILTIAZEM HCL 30 MG PO TABS
30.0000 mg | ORAL_TABLET | Freq: Once | ORAL | Status: AC
  Administered 2022-08-08: 30 mg via ORAL
  Filled 2022-08-08: qty 1

## 2022-08-08 MED ORDER — SODIUM CHLORIDE 0.9 % IV BOLUS
500.0000 mL | Freq: Once | INTRAVENOUS | Status: AC
  Administered 2022-08-08: 500 mL via INTRAVENOUS

## 2022-08-08 NOTE — ED Provider Notes (Signed)
[facility] Provider Note   CSN: [id number] Arrival date & time: 08/08/22  1237     History {Add pertinent medical, surgical, social history, OB history to HPI:1} Chief Complaint  Patient presents with   Weakness   Atrial Fibrillation    Stephanie Gonzalez is a 82 y.o. female.  Patient states that she felt like her heart was beating fast.  She has a history of atrial fibs which occurred 1 time last October.  Patient also has a history of bronchospasm.  She was seen by the paramedics who gave her 19 mg of Cardizem and started her on a drip.  When she arrived in the emergency department she was in normal sinus   Weakness Atrial Fibrillation       Home Medications Prior to Admission medications   Medication Sig Start Date End Date Taking? Authorizing Provider  albuterol (VENTOLIN HFA) 108 (90 Base) MCG/ACT inhaler Inhale 2 puffs into the lungs every 6 (six) hours as needed for wheezing or shortness of breath. 04/03/22   Claretta Fraise, MD  apixaban (ELIQUIS) 5 MG TABS tablet Take 1 tablet (5 mg total) by mouth 2 (two) times daily. 07/29/22   Sande Rives E, PA-C  atorvastatin (LIPITOR) 80 MG tablet Take 1 tablet (80 mg total) by mouth daily. Patient taking differently: Take 40 mg by mouth daily. 03/28/22   Sande Rives E, PA-C  b complex vitamins tablet Take 1 tablet by mouth daily.    [provider]  clonazePAM (KLONOPIN) 1 MG tablet TAKE 1 TABLET AT BEDTIME FOR RESTLESS LEGS 08/04/22   Claretta Fraise, MD  diltiazem (CARDIZEM) 30 MG tablet Take 1 tablet (30 mg total) by mouth 2 (two) times daily. 08/08/22   Milton Ferguson, MD  famotidine (PEPCID) 20 MG tablet Take 1 tablet (20 mg total) by mouth 2 (two) times daily. 08/10/20   Claretta Fraise, MD  fenofibrate 160 MG tablet TAKE 1 TABLET BY MOUTH  DAILY FOR CHOLESTEROL AND  TRIGLYCERIDE Patient taking differently: Take 160 mg by mouth daily. 10/02/21   Claretta Fraise, MD   Glycerin-Polysorbate 80 (REFRESH DRY EYE THERAPY OP) Place 1-2 drops into both eyes at bedtime.    [provider]  HYDROcodone-acetaminophen (NORCO) 10-325 MG tablet Take 0.5 tablets by mouth 2 (two) times daily as needed for moderate pain. Patient taking differently: Take 0.5 tablets by mouth every 8 (eight) hours as needed for moderate pain. 03/04/16   Marletta Lor, MD  sertraline (ZOLOFT) 50 MG tablet Take 0.5-1 tablets (25-50 mg total) by mouth daily. Patient taking differently: Take 25 mg by mouth daily. 04/24/21   Claretta Fraise, MD      Allergies    Other and Tetracycline    Review of Systems   Review of Systems  Neurological:  Positive for weakness.    Physical Exam Updated Vital Signs BP 126/62   Pulse 63   Temp 97.7 F (36.5 C)   Resp 18   Ht '5\' 4"'$  (1.626 m)   Wt 77 kg   SpO2 98%   BMI 29.14 kg/m  Physical Exam  ED Results / Procedures / Treatments   Labs (all labs ordered are listed, but only abnormal results are displayed) Labs Reviewed  COMPREHENSIVE METABOLIC PANEL - Abnormal; Notable for the following components:      Result Value   Potassium 3.4 (*)    Glucose, Bld 138 (*)    All other components within normal limits  CBC WITH DIFFERENTIAL/PLATELET  TROPONIN I (HIGH SENSITIVITY)  TROPONIN I (HIGH SENSITIVITY)    EKG EKG Interpretation  Date/Time:  Friday August 08 2022 12:49:20 EST Ventricular Rate:  73 PR Interval:  183 QRS Duration: 128 QT Interval:  431 QTC Calculation: 475 R Axis:   -52 Text Interpretation: Sinus rhythm Left bundle branch block Confirmed by Milton Ferguson [phone]) on 08/08/2022 1:06:31 PM  Radiology DG Chest Port 1 View  Result Date: 08/08/2022 CLINICAL DATA:  Weak EXAM: PORTABLE CHEST 1 VIEW COMPARISON:  02/27/22 FINDINGS: Spinal nerve stimulator leads project over the midthoracic spine. No pleural effusion. No pneumothorax. Unchanged normal cardiac and mediastinal contours. No focal airspace opacity. No  radiographically apparent displaced rib fractures. Visualized upper abdomen is unremarkable. Degenerative changes at the left [facility] joint IMPRESSION: No focal airspace opacity. Electronically Signed   By: Marin Roberts M.D.   On: 08/08/2022 13:25    Procedures Procedures  {Document cardiac monitor, telemetry assessment procedure when appropriate:1}  Medications Ordered in ED Medications  sodium chloride 0.9 % bolus 500 mL (500 mLs Intravenous New Bag/Given 08/08/22 1455)  diltiazem (CARDIZEM) tablet 30 mg (30 mg Oral Given 08/08/22 1455)    ED Course/ Medical Decision Making/ A&P  Patient with atrial fibs that has resolved on Cardizem.  I spoke to Dr. Laruth Bouchard cardiology and she will be discharged home with 30 mg of Cardizem twice a day {    CRITICAL CARE Performed by: Milton Ferguson Total critical care time: 35 minutes Critical care time was exclusive of separately billable procedures and treating other patients. Critical care was necessary to treat or prevent imminent or life-threatening deterioration. Critical care was time spent personally by me on the following activities: development of treatment plan with patient and/or surrogate as well as nursing, discussions with consultants, evaluation of patient's response to treatment, examination of patient, obtaining history from patient or surrogate, ordering and performing treatments and interventions, ordering and review of laboratory studies, ordering and review of radiographic studies, pulse oximetry and re-evaluation of patient's condition.   Click here for ABCD2, HEART and other calculatorsREFRESH Note before signing :1}                          Medical Decision Making Amount and/or Complexity of Data Reviewed Labs: ordered. Radiology: ordered.  Risk Prescription drug management.   Patient with rapid atrial fibs that has resolved with IV Cardizem.  She will be discharged home with p.o. Cardizem she is already taken a blood thinner.  She  will follow-up with cardiology in the next couple weeks  {Document critical care time when appropriate:1} {Document review of labs and clinical decision tools ie heart score, Chads2Vasc2 etc:1}  {Document your independent review of radiology images, and any outside records:1} {Document your discussion with family members, caretakers, and with consultants:1} {Document social determinants of health affecting pt's care:1} {Document your decision making why or why not admission, treatments were needed:1} Final Clinical Impression(s) / ED Diagnoses Final diagnoses:  None    Rx / DC Orders ED Discharge Orders          Ordered    diltiazem (CARDIZEM) 30 MG tablet  2 times daily,   Status:  Discontinued        08/08/22 1528    diltiazem (CARDIZEM) 30 MG tablet  2 times daily        08/08/22 1529

## 2022-08-08 NOTE — ED Triage Notes (Signed)
Pt ot ER via EMS from home with c/o generalized weakness.  Upon EMS arrival pt noted to be afib RVR.  Pt was taken off of metoprolol several weeks ago and was not given a replacement medication.  EMS administered cardizem bolus followed by a maintenance drip.  Rate now controlled.

## 2022-08-08 NOTE — Discharge Instructions (Addendum)
The cardiology group will get in touch with you so you can follow-up with them in the next 2 to 3 weeks.  Return if any problems

## 2022-08-11 ENCOUNTER — Telehealth: Payer: Self-pay

## 2022-08-11 NOTE — Transitions of Care (Post Inpatient/ED Visit) (Signed)
   08/11/2022  Name: Stephanie Gonzalez MRN: [id number] DOB: 1940-08-08  Today's TOC FU Call Status: Today's TOC FU Call Status:: Successful TOC FU Call Competed TOC FU Call Complete Date: 08/11/22  Transition Care Management Follow-up Telephone Call Date of Discharge: 08/08/22 Discharge Facility: [facility] Type of Discharge: Emergency Department How have you been since you were released from the hospital?: Better Any questions or concerns?: No  Items Reviewed: Did you receive and understand the discharge instructions provided?: Yes Medications obtained and verified?: Yes (Medications Reviewed) Any new allergies since your discharge?: No Dietary orders reviewed?: Yes Do you have support at home?: Yes People in Home: spouse  Home Care and Equipment/Supplies: [place] Ordered?: NA Any new equipment or medical supplies ordered?: NA  Functional Questionnaire: Do you need assistance with bathing/showering or dressing?: No Do you need assistance with meal preparation?: No Do you need assistance with eating?: No Do you have difficulty maintaining continence: No Do you need assistance with getting out of bed/getting out of a chair/moving?: No Do you have difficulty managing or taking your medications?: No  Folllow up appointments reviewed: PCP Follow-up appointment confirmed?: [facility] Follow-up appointment confirmed?: No Date of Specialist follow-up appointment?:  (patient will call) Follow-Up Specialty Provider:: cardio Reason Specialist Follow-Up Not Confirmed: Patient has Specialist Provider Number and will Call for Appointment Do you need transportation to your follow-up appointment?: No Do you understand care options if your condition(s) worsen?: Yes-patient verbalized understanding    SIGNATURE Juanda Crumble, [place] Nurse Health Advisor Direct Dial [phone]

## 2022-08-29 ENCOUNTER — Ambulatory Visit: Payer: Medicare Other | Admitting: Nurse Practitioner

## 2022-09-08 ENCOUNTER — Other Ambulatory Visit: Payer: Self-pay | Admitting: Nurse Practitioner

## 2022-09-08 ENCOUNTER — Encounter: Payer: Self-pay | Admitting: Nurse Practitioner

## 2022-09-08 ENCOUNTER — Ambulatory Visit: Payer: Medicare Other | Attending: Nurse Practitioner | Admitting: Nurse Practitioner

## 2022-09-08 ENCOUNTER — Ambulatory Visit: Payer: Medicare Other | Attending: Nurse Practitioner

## 2022-09-08 VITALS — BP 140/80 | HR 62 | Wt 169.6 lb

## 2022-09-08 DIAGNOSIS — R5383 Other fatigue: Secondary | ICD-10-CM

## 2022-09-08 DIAGNOSIS — I480 Paroxysmal atrial fibrillation: Secondary | ICD-10-CM | POA: Diagnosis not present

## 2022-09-08 DIAGNOSIS — Z7901 Long term (current) use of anticoagulants: Secondary | ICD-10-CM

## 2022-09-08 DIAGNOSIS — I2510 Atherosclerotic heart disease of native coronary artery without angina pectoris: Secondary | ICD-10-CM

## 2022-09-08 DIAGNOSIS — E785 Hyperlipidemia, unspecified: Secondary | ICD-10-CM

## 2022-09-08 DIAGNOSIS — I10 Essential (primary) hypertension: Secondary | ICD-10-CM | POA: Diagnosis not present

## 2022-09-08 DIAGNOSIS — I4891 Unspecified atrial fibrillation: Secondary | ICD-10-CM

## 2022-09-08 MED ORDER — DILTIAZEM HCL 30 MG PO TABS: 30.0000 mg | ORAL_TABLET | Freq: Two times a day (BID) | ORAL | 1 refills | Status: DC

## 2022-09-08 MED ORDER — APIXABAN 5 MG PO TABS: 5.0000 mg | ORAL_TABLET | Freq: Two times a day (BID) | ORAL | 1 refills | Status: DC

## 2022-09-08 NOTE — Patient Instructions (Addendum)
Medication Instructions:  Your physician recommends that you continue on your current medications as directed. Please refer to the Current Medication list given to you today.   Labwork: none  Testing/Procedures: ZIO- Long Term Monitor Instructions   Your physician has requested you wear your ZIO patch monitor 14 days.   This is a single patch monitor.  [employer] supplies one patch monitor per enrollment.  Additional stickers are not available.   Please do not apply patch if you will be having a Nuclear Stress Test, Echocardiogram, Cardiac CT, MRI, or Chest Xray during the time frame you would be wearing the monitor. The patch cannot be worn during these tests.  You cannot remove and re-apply the ZIO XT patch monitor.    While looking in a mirror, press and release button in center of patch.  A small green light will flash 3-4 times .  This will be your only indicator the monitor has been turned on.     Do not shower for the first 24 hours.  You may shower after the first 24 hours.   Press button if you feel a symptom. You will hear a small click.  Record Date, Time and Symptom in the Patient Log Book.   When you are ready to remove patch, follow instructions on last 2 pages of Patient Log Book.  Stick patch monitor onto last page of Patient Log Book.   Place Patient Log Book in [place] box.  Use locking tab on box and tape box closed securely.  The [employer] has [employer] on it.  Please place in mailbox as soon as possible.  Your physician should have your test results approximately 7 days after the monitor has been mailed back to [facility].   Call [place] at [phone] if you have questions regarding your ZIO XT patch monitor.  Call them immediately if you see an orange light blinking on your monitor.   If your monitor falls off in less than 4 days contact our Monitor department at [phone].  If your monitor becomes loose or falls off after 4  days call [employer] at [phone] for suggestions on securing your monitor.   Follow-Up:  Your physician recommends that you schedule a follow-up appointment in: 6-8 weeks  Any Other Special Instructions Will Be Listed Below (If Applicable).  If you need a refill on your cardiac medications before your next appointment, please call your pharmacy.

## 2022-09-08 NOTE — Progress Notes (Signed)
Office Visit    Patient Name: Stephanie Gonzalez Date of Encounter: 09/08/2022  PCP:  Claretta Fraise, [facility]  Cardiologist:  Sanda Klein, MD  Advanced Practice Provider:  No care team member to display Electrophysiologist:  None   Chief Complaint    Stephanie Gonzalez is a 82 y.o. female with a hx of CAD, PAF, type 2 diabetes, hypertension, hyperlipidemia, asthma, vertigo, and RLS, who presents today for A-fib evaluation   Past Medical History    Past Medical History:  Diagnosis Date   Allergy    Arthritis    ASTHMA 10/06/2007   Bronchial asthma    BURSITIS, RIGHT HIP 02/04/2010   DIABETES MELLITUS, TYPE II 11/01/2009   Family history of adverse reaction to anesthesia    Mother had n/v   H/O vertigo    History of kidney stones    passed on her own   HYPERLIPIDEMIA 10/06/2007   HYPERTENSION 10/06/2007   Pneumonia    Restless leg syndrome    Past Surgical History:  Procedure Laterality Date   ABDOMINAL HYSTERECTOMY     [place]   ? 90's   CHOLECYSTECTOMY     COLONOSCOPY     KNEE ARTHROSCOPY Right    SPINAL CORD STIMULATOR INSERTION N/A 07/13/2014   Procedure: LUMBAR SPINAL CORD STIMULATOR INSERTION;  Surgeon: Melina Schools, MD;  Location: [facility];  Service: Orthopedics;  Laterality: N/A;   SPINE SURGERY     TONSILLECTOMY     TUBAL LIGATION      Allergies  Allergies  Allergen Reactions   Other Other (See Comments)    Oranges; juice or nuts    Tetracycline Rash and Other (See Comments)    Agitation    History of Present Illness    [name] is a 82 y.o. female with a PMH as mentioned above.  Diagnosed with A-fib 02/2022 during hospital admission.  Started on IV Cardizem, converted back to NSR. TTE revealed normal EF, no RWMA, mildly elevated PASP.  Transitioned to Lopressor, started on Eliquis with plans for outpatient CCTA.  At follow-up, was in [place] with HR in high 40s.  Patient noted weakness  and fatigue.  It was discussed about stopping beta-blocker, however patient preferred to switch from Lopressor to Toprol, dose was reduced in half.  CCTA revealed coronary calcium score 105, minimal nonobstructive CAD noted.  Last seen by Sande Rives, PA-C on July 29, 2022.  Continues to note fatigue and no energy.  Heart rate maintained in 40s to 50s.  She was having some bleeding, stopped Eliquis.  Reported DOE, no new symptoms.  Was advised to restart Eliquis.  Toprol-XL was stopped due to bradycardia and significant fatigue.  Presented to [facility] ED with palpitations on 08/08/2022, felt like her heart was racing.  She was given Cardizem by paramedics, upon arrival to the ED she converted to normal sinus rhythm.  EKG in ED revealed sinus rhythm.  Cardiology was consulted by ED physician, discharged home with Cardizem 30 mg twice daily.  Today she presents for follow-up. She states she has only been taking Eliquis once a day instead of twice a day d/t past bleeding issues/concerns. Overall doing well. Denies any chest pain, shortness of breath, syncope, presyncope, dizziness, orthopnea, PND, swelling or significant weight changes, acute bleeding, or claudication. Denies any recurring palpitations since ED visit. Admits to fatigue.   EKGs/Labs/Other Studies Reviewed:  The following studies were reviewed today:   EKG:  EKG is not ordered today.    CCTA 03/2022: 1. Minimal mixed non-obstructive CAD, CADRADS = 1.   2. Coronary calcium score of 105. This was 49th percentile for age and sex matched control.   3. Normal coronary origin with left dominance.   4. Consider non-coronary causes of shoulder pain.   5. Posterior mitral annular calcification.   6. Aortic atherosclerosis.  Two pulmonary nodules, largest measuring 3 mm in the lingula. No follow-up needed if patient is low-risk (and has no known or suspected primary neoplasm). Non-contrast chest CT can be considered in 12  months if patient is high-risk. This recommendation follows the consensus statement: Guidelines for Management of Incidental Pulmonary Nodules Detected on CT Images: From the Fleischner Society 2017; Radiology 2017; 284:228-243.  Echo 02/2022 - see report.  Recent Labs: 02/27/2022: B Natriuretic Peptide 135.9; Magnesium 2.1; TSH 1.112 08/08/2022: ALT 11; BUN 17; Creatinine, Ser 0.89; Hemoglobin 12.9; Platelets 263; Potassium 3.4; Sodium 136  Recent Lipid Panel    Component Value Date/Time   CHOL 174 10/22/2021 1353   TRIG 207 (H) 10/22/2021 1353   HDL 57 10/22/2021 1353   CHOLHDL 3.1 10/22/2021 1353   CHOLHDL 5 08/04/2017 0859   VLDL 46.6 (H) 08/04/2017 0859   LDLCALC 83 10/22/2021 1353   LDLDIRECT 95.0 08/04/2017 0859    Risk Assessment/Calculations:   CHA2DS2-VASc Score = 5  This indicates a 7.2% annual risk of stroke. The patient's score is based upon: CHF History: 0 HTN History: 1 Diabetes History: 1 Stroke History: 0 Vascular Disease History: 0 Age Score: 2 Gender Score: 1   Home Medications   Current Meds  Medication Sig   albuterol (VENTOLIN HFA) 108 (90 Base) MCG/ACT inhaler Inhale 2 puffs into the lungs every 6 (six) hours as needed for wheezing or shortness of breath.   atorvastatin (LIPITOR) 80 MG tablet Take 1 tablet (80 mg total) by mouth daily. (Patient taking differently: Take 40 mg by mouth daily.)   b complex vitamins tablet Take 1 tablet by mouth daily.   clonazePAM (KLONOPIN) 1 MG tablet TAKE 1 TABLET AT BEDTIME FOR RESTLESS LEGS   famotidine (PEPCID) 20 MG tablet Take 1 tablet (20 mg total) by mouth 2 (two) times daily.   fenofibrate 160 MG tablet TAKE 1 TABLET BY MOUTH  DAILY FOR CHOLESTEROL AND  TRIGLYCERIDE (Patient taking differently: Take 160 mg by mouth daily.)   Glycerin-Polysorbate 80 (REFRESH DRY EYE THERAPY OP) Place 1-2 drops into both eyes at bedtime.   HYDROcodone-acetaminophen (NORCO) 10-325 MG tablet Take 0.5 tablets by mouth 2 (two)  times daily as needed for moderate pain. (Patient taking differently: Take 0.5 tablets by mouth every 8 (eight) hours as needed for moderate pain.)   sertraline (ZOLOFT) 50 MG tablet Take 0.5-1 tablets (25-50 mg total) by mouth daily. (Patient taking differently: Take 25 mg by mouth daily.)   apixaban (ELIQUIS) 5 MG TABS tablet Take 1 tablet (5 mg total) by mouth 2 (two) times daily. (Patient taking differently: Take 5 mg by mouth daily.)   diltiazem (CARDIZEM) 30 MG tablet Take 1 tablet (30 mg total) by mouth 2 (two) times daily.     Review of Systems    All other systems reviewed and are otherwise negative except as noted above.  Physical Exam    VS:  BP (!) 140/80 (BP Location: Left Arm, Patient Position: Sitting, Cuff Size: Normal)   Pulse 62   Wt 169  lb 9.6 oz (76.9 kg)   SpO2 99%   BMI 29.11 kg/m  , BMI Body mass index is 29.11 kg/m.  Wt Readings from Last 3 Encounters:  09/08/22 169 lb 9.6 oz (76.9 kg)  08/08/22 169 lb 12.1 oz (77 kg)  07/29/22 170 lb 9.6 oz (77.4 kg)     GEN: Well nourished, well developed, in no acute distress. HEENT: normal. Neck: Supple, no JVD, carotid bruits, or masses. Cardiac: S1/S2, RRR, no murmurs, rubs, or gallops. No clubbing, cyanosis, edema.  Radials/PT 2+ and equal bilaterally.  Respiratory:  Respirations regular and unlabored, clear to auscultation bilaterally. MS: No deformity or atrophy. Skin: Warm and dry, no rash. Neuro:  Strength and sensation are intact. Psych: Normal affect.  Assessment & Plan    PAF, chronic anticoagulation Denies any recurrent tachycardia or palpitations. SR on exam today. BB previously D/C d/t bradycardia. CHA2DS2-VASc Score = 5. Discussed taking Eliquis 5 mg BID, verbalized understanding. Will refill Eliquis and diltiazem per her request. On appropriate dosage of Eliquis. Heart healthy diet and regular cardiovascular exercise encouraged. Arranging monitor as mentioned below. Recommended [employer] mobile app.  Discussed avoiding triggers to A-fib. Care precautions and ED precautions discussed.   CAD Stable with no anginal symptoms. No indication for ischemic evaluation. Continue atorvastatin. Not on ASA d/t being on Eliquis. BB stopped d/t bradycardia. Heart healthy diet and regular cardiovascular exercise encouraged.   HTN BP on arrival 140/80, BP at home well controlled. Continue current medication regimen. Discussed to monitor BP at home at least 2 hours after medications and sitting for 5-10 minutes. Heart healthy diet and regular cardiovascular exercise encouraged.   HLD Future lipid panel arranged by Sande Rives, PA-C. Continue atorvastatin. Heart healthy diet and regular cardiovascular exercise encouraged.   Fatigue Previous HR found to be in 40's-50's. Will arrange 2 week Zio monitor to evaluate for any pauses/bradycardia that would explain her symptoms. Recent labs in ED overall unremarkable. No medication changes at this time. Heart healthy diet and regular cardiovascular exercise encouraged.   Disposition: Follow up in 8 week(s) with Sanda Klein, MD or APP.  Signed, Finis Bud, NP 09/08/2022, [place]

## 2022-09-09 ENCOUNTER — Telehealth: Payer: Self-pay | Admitting: Cardiovascular Disease

## 2022-09-09 NOTE — Telephone Encounter (Signed)
Spoke to pharmacist at [facility] and informed him that eliquis and diltiazem was sent to the mail order and that she is to start back taking the eliquis 5 mg twice a day as prescribed and not once a day. Pharmacist states that he will contact the patient and make her aware and if she wants the prescriptions to be filled at [facility] instead of [facility], he will call [facility] and have prescriptions transferred. I tried to reach out to patient but did not get an answer.

## 2022-09-09 NOTE — Telephone Encounter (Signed)
Pt c/o medication issue:  1. Name of Medication: Unsure  2. How are you currently taking this medication (dosage and times per day)?   3. Are you having a reaction (difficulty breathing--STAT)?   4. What is your medication issue? [facility] states that patient has come to them unsure as to which medication was to be called in for her from her last office visit and would like assistance to figure out what it is she is to start taking. Please advise.

## 2022-09-10 ENCOUNTER — Telehealth: Payer: Self-pay | Admitting: Cardiovascular Disease

## 2022-09-10 NOTE — Telephone Encounter (Signed)
*  STAT* If patient is at the pharmacy, call can be transferred to refill team.   1. Which medications need to be refilled? (please list name of each medication and dose if known)   diltiazem (CARDIZEM) 30 MG tablet   2. Which pharmacy/location (including street and city if local pharmacy) is medication to be sent to?  [place], [place]   3. Do they need a 30 day or 90 day supply?   30 day  Patient states she only has 2 tablets left and needs a prescription sent to [place], [place] as it will take too long for [facility] to deliver the medication.

## 2022-09-11 MED ORDER — DILTIAZEM HCL 30 MG PO TABS: 30.0000 mg | ORAL_TABLET | Freq: Two times a day (BID) | ORAL | 1 refills | Status: DC

## 2022-09-11 NOTE — Telephone Encounter (Signed)
Refills has been sent to your local pharmacy.

## 2022-11-04 ENCOUNTER — Encounter: Payer: Self-pay | Admitting: Nurse Practitioner

## 2022-11-04 ENCOUNTER — Ambulatory Visit: Payer: Medicare Other | Attending: Nurse Practitioner | Admitting: Nurse Practitioner

## 2022-11-04 VITALS — BP 124/72 | HR 66 | Ht 66.0 in | Wt 172.0 lb

## 2022-11-04 DIAGNOSIS — R5383 Other fatigue: Secondary | ICD-10-CM

## 2022-11-04 DIAGNOSIS — I480 Paroxysmal atrial fibrillation: Secondary | ICD-10-CM | POA: Diagnosis not present

## 2022-11-04 DIAGNOSIS — K068 Other specified disorders of gingiva and edentulous alveolar ridge: Secondary | ICD-10-CM

## 2022-11-04 DIAGNOSIS — I2510 Atherosclerotic heart disease of native coronary artery without angina pectoris: Secondary | ICD-10-CM | POA: Diagnosis not present

## 2022-11-04 DIAGNOSIS — E785 Hyperlipidemia, unspecified: Secondary | ICD-10-CM

## 2022-11-04 DIAGNOSIS — I10 Essential (primary) hypertension: Secondary | ICD-10-CM

## 2022-11-04 DIAGNOSIS — Z7901 Long term (current) use of anticoagulants: Secondary | ICD-10-CM | POA: Diagnosis not present

## 2022-11-04 NOTE — Progress Notes (Unsigned)
Office Visit    Patient Name: Stephanie Gonzalez Date of Encounter: 11/04/2022  PCP:  Mechele Claude, MD   [facility]  Cardiologist:  Thurmon Fair, MD  Advanced Practice Provider:  No care team member to display Electrophysiologist:  None   Chief Complaint    Stephanie Gonzalez is a 82 y.o. female with a hx of CAD, PAF, type 2 diabetes, hypertension, hyperlipidemia, asthma, vertigo, and RLS, who presents today for A-fib follow-up.   Past Medical History    Past Medical History:  Diagnosis Date   Allergy    Arthritis    ASTHMA 10/06/2007   Bronchial asthma    BURSITIS, RIGHT HIP 02/04/2010   DIABETES MELLITUS, TYPE II 11/01/2009   Family history of adverse reaction to anesthesia    Mother had n/v   H/O vertigo    History of kidney stones    passed on her own   HYPERLIPIDEMIA 10/06/2007   HYPERTENSION 10/06/2007   Pneumonia    Restless leg syndrome    Past Surgical History:  Procedure Laterality Date   ABDOMINAL HYSTERECTOMY     APPENDECTOMY     CARDIAC CATHETERIZATION  1992   ? 90's   CHOLECYSTECTOMY     COLONOSCOPY     KNEE ARTHROSCOPY Right    SPINAL CORD STIMULATOR INSERTION N/A 07/13/2014   Procedure: LUMBAR SPINAL CORD STIMULATOR INSERTION;  Surgeon: Venita Lick, MD;  Location: [facility] OR;  Service: Orthopedics;  Laterality: N/A;   SPINE SURGERY     TONSILLECTOMY     TUBAL LIGATION      Allergies  Allergies  Allergen Reactions   Other Other (See Comments)    Oranges; juice or nuts    Tetracycline Rash and Other (See Comments)    Agitation    History of Present Illness    Stephanie Gonzalez is a 82 y.o. female with a PMH as mentioned above.  Diagnosed with A-fib 02/2022 during hospital admission.  Started on IV Cardizem, converted back to NSR. TTE revealed normal EF, no RWMA, mildly elevated PASP.  Transitioned to Lopressor, started on Eliquis with plans for outpatient CCTA.  At follow-up, was in SR with HR in high 40s.  Patient noted weakness  and fatigue.  It was discussed about stopping beta-blocker, however patient preferred to switch from Lopressor to Toprol, dose was reduced in half.  CCTA revealed coronary calcium score 105, minimal nonobstructive CAD noted.  Last seen by Marjie Skiff, PA-C on July 29, 2022.  Continues to note fatigue and no energy.  Heart rate maintained in 40s to 50s.  She was having some bleeding, stopped Eliquis.  Reported DOE, no new symptoms.  Was advised to restart Eliquis.  Toprol-XL was stopped due to bradycardia and significant fatigue.  [facility] to [facility] with palpitations on 08/08/2022, felt like her heart was racing.  She was given Cardizem by paramedics, upon arrival to the ED she converted to normal sinus rhythm.  EKG in ED revealed sinus rhythm.  Cardiology was consulted by ED physician, discharged home with Cardizem 30 mg twice daily.  Today she presents for follow-up. Continues to take Eliquis once a day instead of twice a day d/t past bleeding issues along her gums, has issues with her caps and plate along her gumline. Overall doing well from a cardiac perspective. Denies any chest pain, shortness of breath, syncope, presyncope, dizziness, orthopnea, PND, swelling or significant weight changes, acute bleeding, or claudication. Still admits to fatigue.  EKGs/Labs/Other Studies Reviewed:   The following studies were reviewed today:   EKG:  EKG is not ordered today.    Cardiac monitor 10/03/2022:   Predominant rhythm is mild sinus bradycardia.  The average heart rate is 56 bpm.  There is no evidence of severe bradycardia cardia and there peers to be a poor frequent heart rate response to activity and normal circadian rhythm variation.   There are occasional premature atrial contractions and there are several brief episodes of nonsustained ectopic atrial tachycardia.  There was no evidence of atrial fibrillation.   There is no meaningful ventricular arrhythmia.   There is no evidence  of sinus pauses or high-grade AV block.   Abnormal arrhythmia monitor due to mild baseline sinus bradycardia as well as occasional PACs and brief bursts of nonsustained ectopic atrial tachycardia.  There is no evidence of severe bradycardia, high-grade AV block or atrial fibrillation.     Patch Wear Time:  12 days and 22 hours (2024-04-01T14:53:16-0400 to 2024-04-14T13:47:20-0400)   Patient had a min HR of 37 bpm, max HR of 182 bpm, and avg HR of 56 bpm. Predominant underlying rhythm was Sinus Rhythm. QRS morphology changes were present. 21 Supraventricular Tachycardia runs occurred, the run with the fastest interval lasting 10  beats with a max rate of 182 bpm, the longest lasting 18 beats with an avg rate of 123 bpm. True duration of Supraventricular Tachycardia difficult to ascertain due to artifact. Isolated SVEs were occasional (1.1%, 10239), SVE Couplets were rare (<1.0%,  110), and SVE Triplets were rare (<1.0%, 26). No Isolated VEs, VE Couplets, or VE Triplets were present.  CCTA 03/2022: 1. Minimal mixed non-obstructive CAD, CADRADS = 1.   2. Coronary calcium score of 105. This was 49th percentile for age and sex matched control.   3. Normal coronary origin with left dominance.   4. Consider non-coronary causes of shoulder pain.   5. Posterior mitral annular calcification.   6. Aortic atherosclerosis.  Two pulmonary nodules, largest measuring 3 mm in the lingula. No follow-up needed if patient is low-risk (and has no known or suspected primary neoplasm). Non-contrast chest CT can be considered in 12 months if patient is high-risk. This recommendation follows the consensus statement: Guidelines for Management of Incidental Pulmonary Nodules Detected on CT Images: From the Fleischner Society 2017; Radiology 2017; 284:228-243.  Echo 02/2022 - see report.  Recent Labs: 02/27/2022: B Natriuretic Peptide 135.9; Magnesium 2.1; TSH 1.112 08/08/2022: ALT 11; BUN 17; Creatinine, Ser  0.89; Hemoglobin 12.9; Platelets 263; Potassium 3.4; Sodium 136  Recent Lipid Panel    Component Value Date/Time   CHOL 174 10/22/2021 1353   TRIG 207 (H) 10/22/2021 1353   HDL 57 10/22/2021 1353   CHOLHDL 3.1 10/22/2021 1353   CHOLHDL 5 08/04/2017 0859   VLDL 46.6 (H) 08/04/2017 0859   LDLCALC 83 10/22/2021 1353   LDLDIRECT 95.0 08/04/2017 0859    Risk Assessment/Calculations:   CHA2DS2-VASc Score = 5  This indicates a 7.2% annual risk of stroke. The patient's score is based upon: CHF History: 0 HTN History: 1 Diabetes History: 1 Stroke History: 0 Vascular Disease History: 0 Age Score: 2 Gender Score: 1   Home Medications   Current Meds  Medication Sig   albuterol (VENTOLIN HFA) 108 (90 Base) MCG/ACT inhaler Inhale 2 puffs into the lungs every 6 (six) hours as needed for wheezing or shortness of breath.   apixaban (ELIQUIS) 5 MG TABS tablet Take 1 tablet (5 mg total) by mouth  2 (two) times daily.   atorvastatin (LIPITOR) 80 MG tablet Take 1 tablet (80 mg total) by mouth daily. (Patient taking differently: Take 40 mg by mouth daily.)   b complex vitamins tablet Take 1 tablet by mouth daily.   clonazePAM (KLONOPIN) 1 MG tablet TAKE 1 TABLET AT BEDTIME FOR RESTLESS LEGS   diltiazem (CARDIZEM) 30 MG tablet Take 1 tablet (30 mg total) by mouth 2 (two) times daily.   famotidine (PEPCID) 20 MG tablet Take 1 tablet (20 mg total) by mouth 2 (two) times daily.   fenofibrate 160 MG tablet TAKE 1 TABLET BY MOUTH  DAILY FOR CHOLESTEROL AND  TRIGLYCERIDE (Patient taking differently: Take 160 mg by mouth daily.)   Glycerin-Polysorbate 80 (REFRESH DRY EYE THERAPY OP) Place 1-2 drops into both eyes at bedtime.   HYDROcodone-acetaminophen (NORCO) 10-325 MG tablet Take 0.5 tablets by mouth 2 (two) times daily as needed for moderate pain. (Patient taking differently: Take 0.5 tablets by mouth every 8 (eight) hours as needed for moderate pain.)   sertraline (ZOLOFT) 50 MG tablet Take 0.5-1  tablets (25-50 mg total) by mouth daily. (Patient taking differently: Take 25 mg by mouth daily.)    Review of Systems    All other systems reviewed and are otherwise negative except as noted above.  Physical Exam    VS:  BP 124/72   Pulse 66   Ht 5\' 6"  (1.676 m)   Wt 172 lb (78 kg)   SpO2 97%   BMI 27.76 kg/m  , BMI Body mass index is 27.76 kg/m.  Wt Readings from Last 3 Encounters:  11/04/22 172 lb (78 kg)  09/08/22 169 lb 9.6 oz (76.9 kg)  08/08/22 169 lb 12.1 oz (77 kg)     GEN: Well nourished, well developed, in no acute distress. HEENT: normal. Neck: Supple, no JVD, carotid bruits, or masses. Cardiac: S1/S2, RRR, no murmurs, rubs, or gallops. No clubbing, cyanosis, edema.  Radials/PT 2+ and equal bilaterally.  Respiratory:  Respirations regular and unlabored, clear to auscultation bilaterally. MS: No deformity or atrophy. Skin: Warm and dry, no rash. Neuro:  Strength and sensation are intact. Psych: Normal affect.  Assessment & Plan    PAF, chronic anticoagulation, bleeding gums Denies any tachycardia or palpitations. SR on exam today. BB previously D/C d/t bradycardia. CHA2DS2-VASc Score = 5. Discussed taking Eliquis 5 mg BID, verbalized understanding. On appropriate dosage of Eliquis. Heart healthy diet and regular cardiovascular exercise encouraged. Previous monitor 09/2022 revealed SB, occasional PAC's, with brief episodes of nonsustained ectopic atrial tachycardia, no A-fib, no pauses or high degree heart block. Previously recommended Kardia mobile app. Discussed avoiding triggers to A-fib. Care precautions and ED precautions discussed. No medication changes at this time. Will obtain CBC d/t intermittent bleeding gums, recommended to follow-up with dentist. No evidence of bleeding on exam.  CAD Stable with no anginal symptoms. No indication for ischemic evaluation. Continue atorvastatin. Not on ASA d/t being on Eliquis. BB stopped d/t bradycardia. Heart healthy diet  and regular cardiovascular exercise encouraged.   HTN BP on arrival 140/80, BP at home well controlled. Continue current medication regimen. Discussed to monitor BP at home at least 2 hours after medications and sitting for 5-10 minutes. Heart healthy diet and regular cardiovascular exercise encouraged.   HLD LDL 83 from 1 year ago. Continue atorvastatin. Heart healthy diet and regular cardiovascular exercise encouraged.   Fatigue Previous HR found to be in 40's-50's. 2 week Zio monitor did show SB, no pauses or  high degree AV block. Past labs in ED overall unremarkable, will obtain CBC as mentioned above. No medication changes at this time. Heart healthy diet and regular cardiovascular exercise encouraged.   Disposition: Requesting to switch providers to receive care closer to home. Follow up in 3-4 months with Dr. Jenene Slicker or APP.  Signed, Sharlene Dory, NP 11/06/2022, 3:06 PM [facility]

## 2022-11-04 NOTE — Patient Instructions (Addendum)
Medication Instructions:   Continue all current medications.   Labwork:  CBC - order given  Do in 2-3 weeks Office will contact with results via phone, letter or mychart.     Testing/Procedures:  none  Follow-Up:  3-4 months   Any Other Special Instructions Will Be Listed Below (If Applicable).   If you need a refill on your cardiac medications before your next appointment, please call your pharmacy.

## 2022-11-17 ENCOUNTER — Other Ambulatory Visit: Payer: Self-pay | Admitting: Family Medicine

## 2022-11-17 DIAGNOSIS — G2581 Restless legs syndrome: Secondary | ICD-10-CM

## 2022-11-18 ENCOUNTER — Other Ambulatory Visit: Payer: Self-pay | Admitting: Family Medicine

## 2022-11-18 DIAGNOSIS — G2581 Restless legs syndrome: Secondary | ICD-10-CM

## 2022-11-24 ENCOUNTER — Other Ambulatory Visit: Payer: Self-pay | Admitting: Family Medicine

## 2022-11-24 DIAGNOSIS — G2581 Restless legs syndrome: Secondary | ICD-10-CM

## 2022-11-24 MED ORDER — CLONAZEPAM 1 MG PO TABS: ORAL_TABLET | ORAL | 0 refills | Status: DC

## 2022-11-24 NOTE — Telephone Encounter (Signed)
  Prescription Request  11/24/2022  Is this a "Controlled Substance" medicine? Yes, for restless leg   Have you seen your PCP in the last 2 weeks? Appt made for 6/27 - Patient is going out of town on 6/18-6/23 and will not have enough, wants to know if we can call in enough to last until appt   If YES, route message to pool  -  If NO, patient needs to be scheduled for appointment.  What is the name of the medication or equipment? clonazePAM (KLONOPIN) 1 MG tablet  Have you contacted your pharmacy to request a refill? yes   Which pharmacy would you like this sent to? [facility]    Patient notified that their request is being sent to the clinical staff for review and that they should receive a response within 2 business days.

## 2022-11-24 NOTE — Telephone Encounter (Signed)
Please advise Last OV 04/04/23 Last Rf 08/04/22 Next OV 12/04/22

## 2022-12-01 ENCOUNTER — Ambulatory Visit: Payer: Medicare Other | Admitting: Family Medicine

## 2022-12-04 ENCOUNTER — Ambulatory Visit: Payer: Medicare Other | Admitting: Family Medicine

## 2022-12-18 ENCOUNTER — Ambulatory Visit: Payer: Medicare Other | Admitting: Family Medicine

## 2022-12-18 ENCOUNTER — Encounter: Payer: Self-pay | Admitting: Family Medicine

## 2022-12-18 VITALS — BP 112/61 | HR 49 | Temp 98.0°F | Ht 66.0 in | Wt 169.6 lb

## 2022-12-18 DIAGNOSIS — I10 Essential (primary) hypertension: Secondary | ICD-10-CM | POA: Diagnosis not present

## 2022-12-18 DIAGNOSIS — G2581 Restless legs syndrome: Secondary | ICD-10-CM

## 2022-12-18 DIAGNOSIS — E119 Type 2 diabetes mellitus without complications: Secondary | ICD-10-CM

## 2022-12-18 DIAGNOSIS — Z79899 Other long term (current) drug therapy: Secondary | ICD-10-CM | POA: Diagnosis not present

## 2022-12-18 DIAGNOSIS — F339 Major depressive disorder, recurrent, unspecified: Secondary | ICD-10-CM

## 2022-12-18 DIAGNOSIS — E785 Hyperlipidemia, unspecified: Secondary | ICD-10-CM

## 2022-12-18 LAB — BAYER DCA HB A1C WAIVED: HB A1C (BAYER DCA - WAIVED): 5.2 % (ref 4.8–5.6)

## 2022-12-18 MED ORDER — CLONAZEPAM 1 MG PO TABS: ORAL_TABLET | ORAL | 5 refills | Status: DC

## 2022-12-18 NOTE — Progress Notes (Signed)
Subjective:  Patient ID: Stephanie Gonzalez, female    DOB: 05-06-1941  Age: 82 y.o. MRN: [id number]  CC: Medical Management of Chronic Issues   HPI SHALIKA ARNTZ presents for presents forFollow-up of diabetes. Patient checks blood sugar at home.   118 fasting and not checking  postprandial Patient denies symptoms such as polyuria, polydipsia, excessive hunger, nausea No significant hypoglycemic spells noted. Medications reviewed. Pt reports taking them regularly without complication/adverse reaction being reported today.   Pulse usually in the 50s highest it has been is 99   presents for  follow-up of hypertension. Patient has no history of headache chest pain or shortness of breath or recent cough. Patient also denies symptoms of TIA such as focal numbness or weakness. Patient denies side effects from medication. States taking it regularly.   in for follow-up of elevated cholesterol. Doing well without complaints on current medication. Denies side effects of statin including myalgia and arthralgia and nausea. Currently no chest pain, shortness of breath or other cardiovascular related symptoms noted.  Restless leg med in use is clonazepam. Others didn't work. Due for update of CSA.  Lab Results  Component Value Date   HGBA1C 5.2 12/18/2022   HGBA1C 4.7 (L) 10/22/2021   HGBA1C 5.1 04/24/2021         12/18/2022   11:32 AM 05/15/2022   11:27 AM 04/03/2022    1:48 PM  Depression screen PHQ 2/9  Decreased Interest 0 0 0  Down, Depressed, Hopeless 0 0 0  PHQ - 2 Score 0 0 0  Altered sleeping  0   Tired, decreased energy  0   Change in appetite  0   Feeling bad or failure about yourself   0   Trouble concentrating  0   Moving slowly or fidgety/restless  0   Suicidal thoughts  0   PHQ-9 Score  0     History Melba has a past medical history of Allergy, Arthritis, ASTHMA (10/06/2007), Bronchial asthma, BURSITIS, RIGHT HIP (02/04/2010), DIABETES MELLITUS, TYPE II (11/01/2009), Family  history of adverse reaction to anesthesia, H/O vertigo, History of kidney stones, HYPERLIPIDEMIA (10/06/2007), HYPERTENSION (10/06/2007), Pneumonia, and Restless leg syndrome.   She has a past surgical history that includes Appendectomy; Cholecystectomy; Abdominal hysterectomy; Tubal ligation; Tonsillectomy; Knee arthroscopy (Right); Spinal cord stimulator insertion (N/A, 07/13/2014); Colonoscopy; Cardiac catheterization (1992); and Spine surgery.   Her family history includes Alcoholism in her father; Alzheimer's disease in her mother; Cancer in her father.She reports that she has never smoked. She has never been exposed to tobacco smoke. She has never used smokeless tobacco. She reports that she does not drink alcohol and does not use drugs.    ROS Review of Systems  Constitutional: Negative.   HENT: Negative.    Eyes:  Negative for visual disturbance.  Respiratory:  Negative for shortness of breath.   Cardiovascular:  Negative for chest pain.  Gastrointestinal:  Negative for abdominal pain.  Musculoskeletal:  Negative for arthralgias.    Objective:  BP 112/61   Pulse (!) 49   Temp 98 F (36.7 C)   Ht 5\' 6"  (1.676 m)   Wt 169 lb 9.6 oz (76.9 kg)   SpO2 97%   BMI 27.37 kg/m   BP Readings from Last 3 Encounters:  12/18/22 112/61  11/04/22 124/72  09/08/22 (!) 140/80    Wt Readings from Last 3 Encounters:  12/18/22 169 lb 9.6 oz (76.9 kg)  11/04/22 172 lb (78 kg)  09/08/22 169 lb  9.6 oz (76.9 kg)     Physical Exam Constitutional:      General: She is not in acute distress.    Appearance: She is well-developed.  Cardiovascular:     Rate and Rhythm: Normal rate and regular rhythm.  Pulmonary:     Breath sounds: Normal breath sounds.  Musculoskeletal:        General: Normal range of motion.  Skin:    General: Skin is warm and dry.  Neurological:     Mental Status: She is alert and oriented to person, place, and time.       Assessment & Plan:   Jenese was seen  today for medical management of chronic issues.  Diagnoses and all orders for this visit:  Diabetes mellitus without complication (HCC) -     Bayer DCA Hb A1c Waived  Essential hypertension -     CBC with Differential/Platelet -     CMP14+EGFR  Dyslipidemia -     Lipid panel  Controlled substance agreement signed -     ToxASSURE Select 13 (MW), Urine  Restless legs syndrome (RLS) -     clonazePAM (KLONOPIN) 1 MG tablet; TAKE 1 TABLET AT BEDTIME FOR RESTLESS LEGS  Depression, recurrent (HCC)       I am having Alanea M. Quint maintain her b complex vitamins, HYDROcodone-acetaminophen, famotidine, sertraline, fenofibrate, Glycerin-Polysorbate 80 (REFRESH DRY EYE THERAPY OP), atorvastatin, albuterol, apixaban, diltiazem, and clonazePAM.  Allergies as of 12/18/2022       Reactions   Other Other (See Comments)   Oranges; juice or nuts    Tetracycline Rash, Other (See Comments)   Agitation        Medication List        Accurate as of December 18, 2022 11:59 PM. If you have any questions, ask your nurse or doctor.          albuterol 108 (90 Base) MCG/ACT inhaler Commonly known as: VENTOLIN HFA Inhale 2 puffs into the lungs every 6 (six) hours as needed for wheezing or shortness of breath.   apixaban 5 MG Tabs tablet Commonly known as: ELIQUIS Take 1 tablet (5 mg total) by mouth 2 (two) times daily.   atorvastatin 80 MG tablet Commonly known as: LIPITOR Take 1 tablet (80 mg total) by mouth daily. What changed: how much to take   b complex vitamins tablet Take 1 tablet by mouth daily.   clonazePAM 1 MG tablet Commonly known as: KLONOPIN TAKE 1 TABLET AT BEDTIME FOR RESTLESS LEGS   diltiazem 30 MG tablet Commonly known as: Cardizem Take 1 tablet (30 mg total) by mouth 2 (two) times daily.   famotidine 20 MG tablet Commonly known as: Pepcid Take 1 tablet (20 mg total) by mouth 2 (two) times daily.   fenofibrate 160 MG tablet TAKE 1 TABLET BY MOUTH  DAILY  FOR CHOLESTEROL AND  TRIGLYCERIDE What changed: See the new instructions.   HYDROcodone-acetaminophen 10-325 MG tablet Commonly known as: NORCO Take 0.5 tablets by mouth 2 (two) times daily as needed for moderate pain. What changed: when to take this   REFRESH DRY EYE THERAPY OP Place 1-2 drops into both eyes at bedtime.   sertraline 50 MG tablet Commonly known as: Zoloft Take 0.5-1 tablets (25-50 mg total) by mouth daily. What changed: how much to take         Follow-up: Return in about 6 months (around 06/20/2023).  Mechele Claude, M.D.

## 2022-12-19 LAB — CBC WITH DIFFERENTIAL/PLATELET
Basophils Absolute: 0 10*3/uL (ref 0.0–0.2)
Basos: 0 %
EOS (ABSOLUTE): 0 10*3/uL (ref 0.0–0.4)
Eos: 0 %
Hematocrit: 35.6 % (ref 34.0–46.6)
Hemoglobin: 11.4 g/dL (ref 11.1–15.9)
Immature Grans (Abs): 0.1 10*3/uL (ref 0.0–0.1)
Immature Granulocytes: 1 %
Lymphocytes Absolute: 3.3 10*3/uL — ABNORMAL HIGH (ref 0.7–3.1)
Lymphs: 25 %
MCH: 28.2 pg (ref 26.6–33.0)
MCHC: 32 g/dL (ref 31.5–35.7)
MCV: 88 fL (ref 79–97)
Monocytes Absolute: 0.9 10*3/uL (ref 0.1–0.9)
Monocytes: 7 %
Neutrophils Absolute: 9 10*3/uL — ABNORMAL HIGH (ref 1.4–7.0)
Neutrophils: 67 %
Platelets: 270 10*3/uL (ref 150–450)
RBC: 4.04 x10E6/uL (ref 3.77–5.28)
RDW: 12.6 % (ref 11.7–15.4)
WBC: 13.3 10*3/uL — ABNORMAL HIGH (ref 3.4–10.8)

## 2022-12-19 LAB — CMP14+EGFR
ALT: 9 IU/L (ref 0–32)
AST: 13 IU/L (ref 0–40)
Albumin: 4 g/dL (ref 3.8–4.8)
Alkaline Phosphatase: 111 IU/L (ref 44–121)
BUN/Creatinine Ratio: 24 (ref 12–28)
BUN: 20 mg/dL (ref 8–27)
Bilirubin Total: 0.2 mg/dL (ref 0.0–1.2)
CO2: 22 mmol/L (ref 20–29)
Calcium: 9.8 mg/dL (ref 8.7–10.3)
Chloride: 103 mmol/L (ref 96–106)
Creatinine, Ser: 0.82 mg/dL (ref 0.57–1.00)
Globulin, Total: 2.1 g/dL (ref 1.5–4.5)
Glucose: 96 mg/dL (ref 70–99)
Potassium: 3.9 mmol/L (ref 3.5–5.2)
Sodium: 141 mmol/L (ref 134–144)
Total Protein: 6.1 g/dL (ref 6.0–8.5)
eGFR: 72 mL/min/{1.73_m2} (ref >59)

## 2022-12-19 LAB — LIPID PANEL
Chol/HDL Ratio: 4.3 ratio (ref 0.0–4.4)
Cholesterol, Total: 188 mg/dL (ref 100–199)
HDL: 44 mg/dL (ref >39)
LDL Chol Calc (NIH): 104 mg/dL — ABNORMAL HIGH (ref 0–99)
Triglycerides: 232 mg/dL — ABNORMAL HIGH (ref 0–149)
VLDL Cholesterol Cal: 40 mg/dL (ref 5–40)

## 2022-12-20 ENCOUNTER — Encounter: Payer: Self-pay | Admitting: Family Medicine

## 2022-12-20 NOTE — Progress Notes (Signed)
Hello Breean, ? ?Your lab result is normal and/or stable.Some minor variations that are not significant are commonly marked abnormal, but do not represent any medical problem for you. ? ?Best regards, ?Ida Milbrath, M.D.

## 2022-12-24 LAB — TOXASSURE SELECT 13 (MW), URINE

## 2023-01-19 ENCOUNTER — Ambulatory Visit: Payer: Medicare Other | Admitting: Cardiovascular Disease

## 2023-02-12 ENCOUNTER — Ambulatory Visit: Payer: Medicare Other | Admitting: Internal Medicine

## 2023-03-11 ENCOUNTER — Ambulatory Visit: Payer: Medicare Other | Admitting: Internal Medicine

## 2023-03-24 ENCOUNTER — Ambulatory Visit (INDEPENDENT_AMBULATORY_CARE_PROVIDER_SITE_OTHER): Payer: Medicare Other

## 2023-03-24 DIAGNOSIS — Z23 Encounter for immunization: Secondary | ICD-10-CM

## 2023-03-25 ENCOUNTER — Other Ambulatory Visit (HOSPITAL_BASED_OUTPATIENT_CLINIC_OR_DEPARTMENT_OTHER): Payer: Self-pay

## 2023-03-31 ENCOUNTER — Ambulatory Visit: Payer: Medicare Other | Attending: Internal Medicine | Admitting: Internal Medicine

## 2023-03-31 ENCOUNTER — Encounter: Payer: Self-pay | Admitting: Internal Medicine

## 2023-03-31 VITALS — BP 140/70 | HR 67 | Ht 66.0 in | Wt 168.4 lb

## 2023-03-31 DIAGNOSIS — I480 Paroxysmal atrial fibrillation: Secondary | ICD-10-CM | POA: Diagnosis not present

## 2023-03-31 NOTE — Progress Notes (Signed)
Cardiology Office Note  Date: 03/31/2023   ID: Caselynn, Javaid Jan 19, 1941, MRN [id number]  PCP:  Mechele Claude, MD  Cardiologist:  Marjo Bicker, MD Electrophysiologist:  None   History of Present Illness: Stephanie Gonzalez is a 82 y.o. female known to have paroxysmal atrial fibrillation, HTN, HLD, hypertriglyceridemia is here for follow-up visit.  Overall doing great, legally blind.  No symptoms of palpitations, dizziness, presyncope, syncope, angina, DOE, orthopnea or PND.  She does have bleeding gums and noticed that she was not taking vitamin C like orange recently.  She did cut this down as she was having blisters in the past but after she started to take vitamin B complex capsules, blisters resolved.  She was also told not to eat/drink anything acidic at the time and hence cut back on orange juice.  Past Medical History:  Diagnosis Date   Allergy    Arthritis    ASTHMA 10/06/2007   Bronchial asthma    BURSITIS, RIGHT HIP 02/04/2010   DIABETES MELLITUS, TYPE II 11/01/2009   Family history of adverse reaction to anesthesia    Mother had n/v   H/O vertigo    History of kidney stones    passed on her own   HYPERLIPIDEMIA 10/06/2007   HYPERTENSION 10/06/2007   Pneumonia    Restless leg syndrome     Past Surgical History:  Procedure Laterality Date   ABDOMINAL HYSTERECTOMY     APPENDECTOMY     CARDIAC CATHETERIZATION  1992   ? 90's   CHOLECYSTECTOMY     COLONOSCOPY     KNEE ARTHROSCOPY Right    SPINAL CORD STIMULATOR INSERTION N/A 07/13/2014   Procedure: LUMBAR SPINAL CORD STIMULATOR INSERTION;  Surgeon: Venita Lick, MD;  Location: [facility] OR;  Service: Orthopedics;  Laterality: N/A;   SPINE SURGERY     TONSILLECTOMY     TUBAL LIGATION      Current Outpatient Medications  Medication Sig Dispense Refill   albuterol (VENTOLIN HFA) 108 (90 Base) MCG/ACT inhaler Inhale 2 puffs into the lungs every 6 (six) hours as needed for wheezing or shortness of breath. 18 g 11    apixaban (ELIQUIS) 5 MG TABS tablet Take 1 tablet (5 mg total) by mouth 2 (two) times daily. 180 tablet 1   atorvastatin (LIPITOR) 80 MG tablet Take 1 tablet (80 mg total) by mouth daily. (Patient taking differently: Take 40 mg by mouth daily.) 90 tablet 3   b complex vitamins tablet Take 1 tablet by mouth daily.     clonazePAM (KLONOPIN) 1 MG tablet TAKE 1 TABLET AT BEDTIME FOR RESTLESS LEGS 30 tablet 5   diltiazem (CARDIZEM) 30 MG tablet Take 1 tablet (30 mg total) by mouth 2 (two) times daily. 60 tablet 1   famotidine (PEPCID) 20 MG tablet Take 1 tablet (20 mg total) by mouth 2 (two) times daily. 180 tablet 3   fenofibrate 160 MG tablet TAKE 1 TABLET BY MOUTH  DAILY FOR CHOLESTEROL AND  TRIGLYCERIDE (Patient taking differently: Take 160 mg by mouth daily.) 90 tablet 0   Glycerin-Polysorbate 80 (REFRESH DRY EYE THERAPY OP) Place 1-2 drops into both eyes at bedtime.     HYDROcodone-acetaminophen (NORCO) 10-325 MG tablet Take 0.5 tablets by mouth 2 (two) times daily as needed for moderate pain. (Patient taking differently: Take 0.5 tablets by mouth every 8 (eight) hours as needed for moderate pain (pain score 4-6).) 30 tablet 0   sertraline (ZOLOFT) 50 MG tablet Take 0.5-1  tablets (25-50 mg total) by mouth daily. (Patient taking differently: Take 25 mg by mouth daily.) 90 tablet 2   No current facility-administered medications for this visit.   Allergies:  Other and Tetracycline   Social History: The patient  reports that she has never smoked. She has never been exposed to tobacco smoke. She has never used smokeless tobacco. She reports that she does not drink alcohol and does not use drugs.   Family History: The patient's family history includes Alcoholism in her father; Alzheimer's disease in her mother; Cancer in her father.   ROS:  Please see the history of present illness. Otherwise, complete review of systems is positive for none  All other systems are reviewed and negative.   Physical  Exam: VS:  BP (!) 140/70   Pulse 67   Ht 5\' 6"  (1.676 m)   Wt 168 lb 6.4 oz (76.4 kg)   SpO2 95%   BMI 27.18 kg/m , BMI Body mass index is 27.18 kg/m.  Wt Readings from Last 3 Encounters:  03/31/23 168 lb 6.4 oz (76.4 kg)  12/18/22 169 lb 9.6 oz (76.9 kg)  11/04/22 172 lb (78 kg)    General: Patient appears comfortable at rest. HEENT: Conjunctiva and lids normal, oropharynx clear with moist mucosa. Neck: Supple, no elevated JVP or carotid bruits, no thyromegaly. Lungs: Clear to auscultation, nonlabored breathing at rest. Cardiac: Regular rate and rhythm, no S3 or significant systolic murmur, no pericardial rub. Abdomen: Soft, nontender, no hepatomegaly, bowel sounds present, no guarding or rebound. Extremities: No pitting edema, distal pulses 2+. Skin: Warm and dry. Musculoskeletal: No kyphosis. Neuropsychiatric: Alert and oriented x3, affect grossly appropriate.  Recent Labwork: 12/18/2022: ALT 9; AST 13; BUN 20; Creatinine, Ser 0.82; Hemoglobin 11.4; Platelets 270; Potassium 3.9; Sodium 141     Component Value Date/Time   CHOL 188 12/18/2022 1433   TRIG 232 (H) 12/18/2022 1433   HDL 44 12/18/2022 1433   CHOLHDL 4.3 12/18/2022 1433   CHOLHDL 5 08/04/2017 0859   VLDL 46.6 (H) 08/04/2017 0859   LDLCALC 104 (H) 12/18/2022 1433   LDLDIRECT 95.0 08/04/2017 0859     Assessment and Plan:  Paroxysmal A-fib Minimal CAD by CT cardiac HTN, controlled HLD, at goal Hypertriglyceridemia, not at goal   -EKG today showed NSR, LBBB, HR 66 bpm.  Continue diltiazem 30 mg twice daily and Eliquis 5 mg twice daily.  She does have bleeding gums, no thrombocytopenia, which I think is likely secondary to vitamin C deficiency, encouraged to increase vitamin C intake including fruits like orange etc.  No risk of falls, no indication to hold off on Eliquis. -Currently on fenofibrate 160 mg once daily and atorvastatin 40 mg nightly for management of HLD and hypertriglyceridemia, which we will  continue.  She is at goal for LDL but TG goal should be less than 150.   I have spent a total duration of 30 minutes reviewing the prior notes, labs, imaging studies, face-to-face discussion/counseling of her medical condition, pathophysiology, evaluation, management, answering all her questions and documenting the findings in the note.  Medication Adjustments/Labs and Tests Ordered: Current medicines are reviewed at length with the patient today.  Concerns regarding medicines are outlined above.    Disposition:  Follow up  1 year  Signed Marylee Belzer Verne Spurr, MD, 03/31/2023 4:39 PM    [facility] [address] [place], [place], [place] [address]

## 2023-03-31 NOTE — Patient Instructions (Addendum)

## 2023-04-29 ENCOUNTER — Other Ambulatory Visit: Payer: Self-pay | Admitting: Family Medicine

## 2023-04-29 DIAGNOSIS — Z1231 Encounter for screening mammogram for malignant neoplasm of breast: Secondary | ICD-10-CM

## 2023-05-19 ENCOUNTER — Ambulatory Visit: Payer: Medicare Other

## 2023-05-19 VITALS — Ht 66.0 in | Wt 168.0 lb

## 2023-05-19 DIAGNOSIS — Z0000 Encounter for general adult medical examination without abnormal findings: Secondary | ICD-10-CM | POA: Diagnosis not present

## 2023-05-19 NOTE — Progress Notes (Signed)
Subjective:   Stephanie Gonzalez is a 82 y.o. female who presents for Medicare Annual (Subsequent) preventive examination.  Visit Complete: Virtual I connected with  DIM CARDI on 05/19/23 by a audio enabled telemedicine application and verified that I am speaking with the correct person using two identifiers.  Patient Location: Home  Provider Location: Home Office  I discussed the limitations of evaluation and management by telemedicine. The patient expressed understanding and agreed to proceed.  Vital Signs: Because this visit was a virtual/telehealth visit, some criteria may be missing or patient reported. Any vitals not documented were not able to be obtained and vitals that have been documented are patient reported.  Cardiac Risk Factors include: advanced age (>43men, >19 women);diabetes mellitus;dyslipidemia;hypertension     Objective:    Today's Vitals   05/19/23 1132  Weight: 168 lb (76.2 kg)  Height: 5\' 6"  (1.676 m)   Body mass index is 27.12 kg/m.     05/19/2023   11:36 AM 08/08/2022   12:53 PM 05/15/2022   11:30 AM 02/28/2022   12:00 AM 02/27/2022    1:39 PM 09/26/2021    1:32 PM 05/10/2021    1:32 PM  Advanced Directives  Does Patient Have a Medical Advance Directive? No No Yes No No Yes Yes  Type of Surveyor, minerals;Living will    Healthcare Power of [place];Living will  Copy of Healthcare Power of Attorney in Chart?   No - copy requested    No - copy requested  Would patient like information on creating a medical advance directive? Yes (MAU/Ambulatory/Procedural Areas - Information given) No - Patient declined  No - Patient declined       Current Medications (verified) Outpatient Encounter Medications as of 05/19/2023  Medication Sig   albuterol (VENTOLIN HFA) 108 (90 Base) MCG/ACT inhaler Inhale 2 puffs into the lungs every 6 (six) hours as needed for wheezing or shortness of breath.   apixaban (ELIQUIS) 5 MG TABS tablet Take 1  tablet (5 mg total) by mouth 2 (two) times daily.   atorvastatin (LIPITOR) 80 MG tablet Take 1 tablet (80 mg total) by mouth daily. (Patient taking differently: Take 40 mg by mouth daily.)   b complex vitamins tablet Take 1 tablet by mouth daily.   clonazePAM (KLONOPIN) 1 MG tablet TAKE 1 TABLET AT BEDTIME FOR RESTLESS LEGS   diltiazem (CARDIZEM) 30 MG tablet Take 1 tablet (30 mg total) by mouth 2 (two) times daily.   famotidine (PEPCID) 20 MG tablet Take 1 tablet (20 mg total) by mouth 2 (two) times daily.   fenofibrate 160 MG tablet TAKE 1 TABLET BY MOUTH  DAILY FOR CHOLESTEROL AND  TRIGLYCERIDE (Patient taking differently: Take 160 mg by mouth daily.)   Glycerin-Polysorbate 80 (REFRESH DRY EYE THERAPY OP) Place 1-2 drops into both eyes at bedtime.   HYDROcodone-acetaminophen (NORCO) 10-325 MG tablet Take 0.5 tablets by mouth 2 (two) times daily as needed for moderate pain. (Patient taking differently: Take 0.5 tablets by mouth every 8 (eight) hours as needed for moderate pain (pain score 4-6).)   sertraline (ZOLOFT) 50 MG tablet Take 0.5-1 tablets (25-50 mg total) by mouth daily. (Patient taking differently: Take 25 mg by mouth daily.)   No facility-administered encounter medications on file as of 05/19/2023.    Allergies (verified) Other and Tetracycline   History: Past Medical History:  Diagnosis Date   Allergy    Arthritis    ASTHMA 10/06/2007   Bronchial  asthma    BURSITIS, RIGHT HIP 02/04/2010   DIABETES MELLITUS, TYPE II 11/01/2009   Family history of adverse reaction to anesthesia    Mother had n/v   H/O vertigo    History of kidney stones    passed on her own   HYPERLIPIDEMIA 10/06/2007   HYPERTENSION 10/06/2007   Pneumonia    Restless leg syndrome    Past Surgical History:  Procedure Laterality Date   ABDOMINAL HYSTERECTOMY     APPENDECTOMY     CARDIAC CATHETERIZATION  1992   ? 90's   CHOLECYSTECTOMY     COLONOSCOPY     KNEE ARTHROSCOPY Right    SPINAL CORD  STIMULATOR INSERTION N/A 07/13/2014   Procedure: LUMBAR SPINAL CORD STIMULATOR INSERTION;  Surgeon: Venita Lick, MD;  Location: [facility] OR;  Service: Orthopedics;  Laterality: N/A;   SPINE SURGERY     TONSILLECTOMY     TUBAL LIGATION     Family History  Problem Relation Age of Onset   Alzheimer's disease Mother    Cancer Father    Alcoholism Father    Breast cancer Neg Hx    Social History   Socioeconomic History   Marital status: Married    Spouse name: Fayrene Fearing   Number of children: 3   Years of education: 13   Highest education level: Some college, no degree  Occupational History    Employer: RETIRED  Tobacco Use   Smoking status: Never    Passive exposure: Never   Smokeless tobacco: Never  Vaping Use   Vaping status: Never Used  Substance and Sexual Activity   Alcohol use: No   Drug use: No   Sexual activity: Not Currently    Birth control/protection: Surgical  Other Topics Concern   Not on file  Social History Narrative   Not on file   Social Determinants of Health   Financial Resource Strain: Low Risk  (05/19/2023)   Overall Financial Resource Strain (CARDIA)    Difficulty of Paying Living Expenses: Not hard at all  Food Insecurity: No Food Insecurity (05/19/2023)   Hunger Vital Sign    Worried About Running Out of Food in the Last Year: Never true    Ran Out of Food in the Last Year: Never true  Transportation Needs: No Transportation Needs (05/19/2023)   PRAPARE - Administrator, Civil Service (Medical): No    Lack of Transportation (Non-Medical): No  Physical Activity: Inactive (05/19/2023)   Exercise Vital Sign    Days of Exercise per Week: 0 days    Minutes of Exercise per Session: 0 min  Stress: No Stress Concern Present (05/19/2023)   [employer] of Occupational Health - Occupational Stress Questionnaire    Feeling of Stress : Not at all  Social Connections: Socially Integrated (05/19/2023)   Social Connection and Isolation Panel  [NHANES]    Frequency of Communication with Friends and Family: More than three times a week    Frequency of Social Gatherings with Friends and Family: Three times a week    Attends Religious Services: More than 4 times per year    Active Member of Clubs or Organizations: Yes    Attends Engineer, structural: More than 4 times per year    Marital Status: Married    Tobacco Counseling Counseling given: Not Answered   Clinical Intake:  Pre-visit preparation completed: Yes  Pain : No/denies pain     Diabetes: Yes CBG done?: No Did pt. bring in CBG  monitor from home?: No  How often do you need to have someone help you when you read instructions, pamphlets, or other written materials from your doctor or pharmacy?: 1 - Never  Interpreter Needed?: No  Information entered by :: Kandis Fantasia LPN   Activities of Daily Living    05/19/2023   11:35 AM  In your present state of health, do you have any difficulty performing the following activities:  Hearing? 0  Vision? 0  Difficulty concentrating or making decisions? 0  Walking or climbing stairs? 0  Dressing or bathing? 0  Doing errands, shopping? 0  Preparing Food and eating ? N  Using the Toilet? N  In the past six months, have you accidently leaked urine? N  Do you have problems with loss of bowel control? N  Managing your Medications? N  Managing your Finances? N  Housekeeping or managing your Housekeeping? N    Patient Care Team: Mechele Claude, MD as PCP - General (Family Medicine) Mallipeddi, Orion Modest, MD as PCP - Cardiology (Cardiology)  Indicate any recent Medical Services you may have received from other than [facility] providers in the past year (date may be approximate).     Assessment:   This is a routine wellness examination for Azeria.  Hearing/Vision screen Hearing Screening - Comments:: Denies hearing difficulties   Vision Screening - Comments:: No vision problems; will schedule routine eye exam  soon     Goals Addressed             This Visit's Progress    COMPLETED: Patient Stated       05/09/2020 AWV Goal: Exercise for General Health  Patient will verbalize understanding of the benefits of increased physical activity: Exercising regularly is important. It will improve your overall fitness, flexibility, and endurance. Regular exercise also will improve your overall health. It can help you control your weight, reduce stress, and improve your bone density. Over the next year, patient will increase physical activity as tolerated with a goal of at least 150 minutes of moderate physical activity per week.  You can tell that you are exercising at a moderate intensity if your heart starts beating faster and you start breathing faster but can still hold a conversation. Moderate-intensity exercise ideas include: Walking 1 mile (1.6 km) in about 15 minutes Biking Hiking Golfing Dancing Water aerobics Patient will verbalize understanding of everyday activities that increase physical activity by providing examples like the following: Yard work, such as: Insurance underwriter Gardening Washing windows or floors Patient will be able to explain general safety guidelines for exercising:  Before you start a new exercise program, talk with your health care provider. Do not exercise so much that you hurt yourself, feel dizzy, or get very short of breath. Wear comfortable clothes and wear shoes with good support. Drink plenty of water while you exercise to prevent dehydration or heat stroke. Work out until your breathing and your heartbeat get faster.       Depression Screen    05/19/2023   11:34 AM 12/18/2022   11:32 AM 05/15/2022   11:27 AM 04/03/2022    1:48 PM 03/17/2022    2:33 PM 10/22/2021    1:41 PM 05/10/2021    1:23 PM  PHQ 2/9 Scores  PHQ - 2 Score 0 0 0 0 0 0 0  PHQ- 9 Score   0  0  Fall Risk    05/19/2023   11:35 AM 12/18/2022   11:32 AM 05/15/2022   11:26 AM 04/03/2022    1:48 PM 03/17/2022    2:33 PM  Fall Risk   Falls in the past year? 0 0 0 0 0  Number falls in past yr: 0  0    Injury with Fall? 0  0    Risk for fall due to : No Fall Risks  No Fall Risks    Follow up Falls prevention discussed;Education provided;Falls evaluation completed  Falls prevention discussed  Falls evaluation completed    MEDICARE RISK AT HOME: Medicare Risk at Home Any stairs in or around the home?: No If so, are there any without handrails?: No Home free of loose throw rugs in walkways, pet beds, electrical cords, etc?: Yes Adequate lighting in your home to reduce risk of falls?: Yes Life alert?: No Use of a cane, walker or w/c?: No Grab bars in the bathroom?: Yes Shower chair or bench in shower?: No Elevated toilet seat or a handicapped toilet?: Yes  TIMED UP AND GO:  Was the test performed?  No    Cognitive Function:    08/04/2017    9:57 AM 07/01/2016    9:30 AM  MMSE - Mini Mental State Exam  Not completed: -- --      Significant value        05/19/2023   11:35 AM 05/15/2022   11:30 AM 05/09/2020    1:30 PM 04/27/2019    1:48 PM  6CIT Screen  What Year? 0 points 0 points 0 points 0 points  What month? 0 points 0 points 0 points 0 points  What time? 0 points 0 points 0 points 0 points  Count back from 20 0 points 0 points 0 points 0 points  Months in reverse 2 points 0 points 0 points 0 points  Repeat phrase 2 points 0 points 0 points 0 points  Total Score 4 points 0 points 0 points 0 points    Immunizations Immunization History  Administered Date(s) Administered   Fluad Quad(high Dose 65+) 04/07/2019, 04/04/2020, 04/11/2021, 03/17/2022   Fluad Trivalent(High Dose 65+) 03/24/2023   Influenza Split 04/16/2012   Influenza Whole 03/24/2007, 03/09/2008, 03/06/2009, 02/04/2010, 02/18/2011   Influenza, High Dose Seasonal PF 03/26/2016, 03/24/2017,  03/23/2018   Influenza,inj,Quad PF,6+ Mos 04/04/2013, 04/10/2014, 03/09/2015   Influenza,inj,quad, With Preservative 03/09/2017   PFIZER Comirnaty(Gray Top)Covid-19 Tri-Sucrose Vaccine 07/19/2019, 08/16/2019, 06/06/2020   Pneumococcal Conjugate-13 06/13/2014   Pneumococcal Polysaccharide-23 04/02/2010   Td 04/02/2010   Zoster Recombinant(Shingrix) 10/22/2021   Zoster, Live 02/18/2011    TDAP status: Due, Education has been provided regarding the importance of this vaccine. Advised may receive this vaccine at local pharmacy or Health Dept. Aware to provide a copy of the vaccination record if obtained from local pharmacy or Health Dept. Verbalized acceptance and understanding.  Flu Vaccine status: Up to date  Pneumococcal vaccine status: Up to date  Covid-19 vaccine status: Information provided on how to obtain vaccines.   Qualifies for Shingles Vaccine? Yes   Zostavax completed Yes   Shingrix Completed?: No.    Education has been provided regarding the importance of this vaccine. Patient has been advised to call insurance company to determine out of pocket expense if they have not yet received this vaccine. Advised may also receive vaccine at local pharmacy or Health Dept. Verbalized acceptance and understanding.  Screening Tests Health Maintenance  Topic Date Due   DTaP/Tdap/Td (2 -  Tdap) 04/02/2020   FOOT EXAM  08/07/2021   Zoster Vaccines- Shingrix (2 of 2) 12/17/2021   OPHTHALMOLOGY EXAM  08/05/2022   Diabetic kidney evaluation - Urine ACR  10/23/2022   HEMOGLOBIN A1C  06/20/2023   Diabetic kidney evaluation - eGFR measurement  12/18/2023   Medicare Annual Wellness (AWV)  05/18/2024   Pneumonia Vaccine 26+ Years old  Completed   INFLUENZA VACCINE  Completed   DEXA SCAN  Completed   HPV VACCINES  Aged Out   COVID-19 Vaccine  Discontinued   Hepatitis C Screening  Discontinued    Health Maintenance  Health Maintenance Due  Topic Date Due   DTaP/Tdap/Td (2 - Tdap)  04/02/2020   FOOT EXAM  08/07/2021   Zoster Vaccines- Shingrix (2 of 2) 12/17/2021   OPHTHALMOLOGY EXAM  08/05/2022   Diabetic kidney evaluation - Urine ACR  10/23/2022    Colorectal cancer screening: No longer required.   Mammogram status: Ordered and scheduled for 06/08/23. Pt provided with contact info and advised to call to schedule appt.   Bone Density status: Completed 07/25/16. Results reflect: Bone density results: OSTEOPENIA. Repeat every 2 years. (Declines further)   Lung Cancer Screening: (Low Dose CT Chest recommended if Age 61-80 years, 20 pack-year currently smoking OR have quit w/in 15years.) does not qualify.   Lung Cancer Screening Referral: n/a  Additional Screening:  Hepatitis C Screening: does not qualify  Vision Screening: Recommended annual ophthalmology exams for early detection of glaucoma and other disorders of the eye. Is the patient up to date with their annual eye exam?  No  Who is the provider or what is the name of the office in which the patient attends annual eye exams? none If pt is not established with a provider, would they like to be referred to a provider to establish care? No .   Dental Screening: Recommended annual dental exams for proper oral hygiene  Diabetic Foot Exam: Diabetic Foot Exam: Overdue, Pt has been advised about the importance in completing this exam. Pt is scheduled for diabetic foot exam on at next office visit.  Community Resource Referral / Chronic Care Management: CRR required this visit?  No   CCM required this visit?  No     Plan:     I have personally reviewed and noted the following in the patient's chart:   Medical and social history Use of alcohol, tobacco or illicit drugs  Current medications and supplements including opioid prescriptions. Patient is not currently taking opioid prescriptions. Functional ability and status Nutritional status Physical activity Advanced directives List of other  physicians Hospitalizations, surgeries, and ER visits in previous 12 months Vitals Screenings to include cognitive, depression, and falls Referrals and appointments  In addition, I have reviewed and discussed with patient certain preventive protocols, quality metrics, and best practice recommendations. A written personalized care plan for preventive services as well as general preventive health recommendations were provided to patient.     Kandis Fantasia [place], [place]   30/16/0109   After Visit Summary: (Mail) Due to this being a telephonic visit, the after visit summary with patients personalized plan was offered to patient via mail   Nurse Notes: No concerns at this time

## 2023-05-19 NOTE — Patient Instructions (Signed)
Ms. Veasy , Thank you for taking time to come for your Medicare Wellness Visit. I appreciate your ongoing commitment to your health goals. Please review the following plan we discussed and let me know if I can assist you in the future.   Referrals/Orders/Follow-Ups/Clinician Recommendations: Aim for 30 minutes of exercise or brisk walking, 6-8 glasses of water, and 5 servings of fruits and vegetables each day.  This is a list of the screening recommended for you and due dates:  Health Maintenance  Topic Date Due   DTaP/Tdap/Td vaccine (2 - Tdap) 04/02/2020   Complete foot exam   08/07/2021   Zoster (Shingles) Vaccine (2 of 2) 12/17/2021   Eye exam for diabetics  08/05/2022   Yearly kidney health urinalysis for diabetes  10/23/2022   Hemoglobin A1C  06/20/2023   Yearly kidney function blood test for diabetes  12/18/2023   Medicare Annual Wellness Visit  05/18/2024   Pneumonia Vaccine  Completed   Flu Shot  Completed   DEXA scan (bone density measurement)  Completed   HPV Vaccine  Aged Out   COVID-19 Vaccine  Discontinued   Hepatitis C Screening  Discontinued    Advanced directives: (ACP Link)Information on Advanced Care Planning can be found at [facility] of [place] Advance Health Care Directives Advance Health Care Directives ([email or web address]   Next Medicare Annual Wellness Visit scheduled for next year: Yes

## 2023-06-08 ENCOUNTER — Ambulatory Visit
Admission: RE | Admit: 2023-06-08 | Discharge: 2023-06-08 | Disposition: A | Discharge status: Home / Self Care | Payer: Medicare Other | Source: Ambulatory Visit | Attending: Family Medicine | Admitting: Family Medicine

## 2023-06-08 DIAGNOSIS — Z1231 Encounter for screening mammogram for malignant neoplasm of breast: Secondary | ICD-10-CM

## 2023-06-08 NOTE — Progress Notes (Signed)
Added.  Sorry for the delay   Stephanie Fantasia, LPN [facility] Health Advisor [place] [facility] You Are. We Are. [facility] Direct Dial [phone]

## 2023-06-24 ENCOUNTER — Other Ambulatory Visit: Payer: Self-pay | Admitting: Family Medicine

## 2023-06-24 DIAGNOSIS — G2581 Restless legs syndrome: Secondary | ICD-10-CM

## 2023-06-25 NOTE — Telephone Encounter (Signed)
Copied from CRM [id number]. Topic: Clinical - Medication Question >> Jun 25, 2023 10:53 AM Donita Brooks wrote: Reason for CRM: pt made an appointment with Dr. Darlyn Read for Thursday 23, pt wants for Dr. Darlyn Read to refill her medication - clonazePAM (KLONOPIN) 1 MG tablet today, she's only have 1 left and really needs it because she has restless legs.

## 2023-06-26 NOTE — Telephone Encounter (Signed)
Copied from CRM [id number]. Topic: Clinical - Prescription Issue >> Jun 26, 2023 12:05 PM Joanette Gula wrote: Patient is inquiring about her clonazePAM (KLONOPIN) 1 MG tablet. She has her appointment scheduled for  1/23 @ 10:25am but she has run completely out and would like some help,  please call [phone]

## 2023-06-27 ENCOUNTER — Other Ambulatory Visit: Payer: Self-pay | Admitting: Family Medicine

## 2023-06-27 DIAGNOSIS — G2581 Restless legs syndrome: Secondary | ICD-10-CM

## 2023-06-29 ENCOUNTER — Other Ambulatory Visit: Payer: Self-pay | Admitting: Family Medicine

## 2023-06-29 ENCOUNTER — Telehealth: Payer: Self-pay | Admitting: Internal Medicine

## 2023-06-29 DIAGNOSIS — G2581 Restless legs syndrome: Secondary | ICD-10-CM

## 2023-06-29 NOTE — Telephone Encounter (Signed)
New Message:     Patient is  at  the dentist. They have a question about patient's medicine.Stephanie Gonzalez

## 2023-06-29 NOTE — Telephone Encounter (Signed)
NA / NVM 

## 2023-06-29 NOTE — Telephone Encounter (Signed)
NA/busy. Clonazepam is a controlled medication which can not be refilled outside an office visit and will be refill at her 07/02/23 visit

## 2023-06-30 ENCOUNTER — Encounter: Payer: Self-pay | Admitting: Family Medicine

## 2023-06-30 ENCOUNTER — Ambulatory Visit (INDEPENDENT_AMBULATORY_CARE_PROVIDER_SITE_OTHER): Payer: Medicare HMO | Admitting: Family Medicine

## 2023-06-30 ENCOUNTER — Ambulatory Visit: Payer: Medicare HMO | Admitting: Nurse Practitioner

## 2023-06-30 VITALS — BP 127/72 | HR 71 | Temp 97.4°F | Ht 66.0 in | Wt 164.0 lb

## 2023-06-30 DIAGNOSIS — E785 Hyperlipidemia, unspecified: Secondary | ICD-10-CM

## 2023-06-30 DIAGNOSIS — F339 Major depressive disorder, recurrent, unspecified: Secondary | ICD-10-CM | POA: Diagnosis not present

## 2023-06-30 DIAGNOSIS — I10 Essential (primary) hypertension: Secondary | ICD-10-CM

## 2023-06-30 DIAGNOSIS — G2581 Restless legs syndrome: Secondary | ICD-10-CM

## 2023-06-30 DIAGNOSIS — Z23 Encounter for immunization: Secondary | ICD-10-CM

## 2023-06-30 LAB — LIPID PANEL

## 2023-06-30 MED ORDER — ATORVASTATIN CALCIUM 80 MG PO TABS: 80.0000 mg | ORAL_TABLET | Freq: Every day | ORAL | 3 refills | Status: AC

## 2023-06-30 MED ORDER — SERTRALINE HCL 50 MG PO TABS: 25.0000 mg | ORAL_TABLET | Freq: Every day | ORAL | 2 refills | Status: DC

## 2023-06-30 MED ORDER — CLONAZEPAM 1 MG PO TABS: ORAL_TABLET | ORAL | 5 refills | Status: DC

## 2023-06-30 MED ORDER — FENOFIBRATE 160 MG PO TABS: 160.0000 mg | ORAL_TABLET | Freq: Every day | ORAL | 3 refills | Status: AC

## 2023-06-30 NOTE — Addendum Note (Signed)
Addended by: Sindy Messing on: 06/30/2023 10:12 AM   Modules accepted: Orders

## 2023-06-30 NOTE — Progress Notes (Signed)
Subjective:  Patient ID: Stephanie Gonzalez, female    DOB: 24-Dec-1940  Age: 83 y.o. MRN: [id number]  CC: Medication Refill (Refill pended)   HPI ZADY READER presents for being out of clonazepam. Husband had Covid and she broke a tooth. Says every night is worse than the night before. Awakens  with legs hurting, wiggling during the night, like they have a mind of their own. Not resting well.    presents for  follow-up of hypertension. Patient has no history of headache chest pain or shortness of breath or recent cough. Patient also denies symptoms of TIA such as focal numbness or weakness. Patient denies side effects from medication. States taking it regularly.   in for follow-up of elevated cholesterol. Doing well without complaints on current medication. Denies side effects of statin including myalgia and arthralgia and nausea. Currently no chest pain, shortness of breath or other cardiovascular related symptoms noted.   Off all diabetes meds and lost weight from 230 t diagnosis. No longer considered diabetic as a result. Nml A1c repeatedly over years.     06/30/2023    8:10 AM 05/19/2023   11:34 AM 12/18/2022   11:32 AM  Depression screen PHQ 2/9  Decreased Interest 0 0 0  Down, Depressed, Hopeless 0 0 0  PHQ - 2 Score 0 0 0    History Chemaine has a past medical history of Allergy, Arthritis, ASTHMA (10/06/2007), Bronchial asthma, BURSITIS, RIGHT HIP (02/04/2010), DIABETES MELLITUS, TYPE II (11/01/2009), Family history of adverse reaction to anesthesia, H/O vertigo, History of kidney stones, HYPERLIPIDEMIA (10/06/2007), HYPERTENSION (10/06/2007), Pneumonia, and Restless leg syndrome.   She has a past surgical history that includes Appendectomy; Cholecystectomy; Abdominal hysterectomy; Tubal ligation; Tonsillectomy; Knee arthroscopy (Right); Spinal cord stimulator insertion (N/A, 07/13/2014); Colonoscopy; Cardiac catheterization (1992); and Spine surgery.   Her family history includes Alcoholism  in her father; Alzheimer's disease in her mother; Cancer in her father.She reports that she has never smoked. She has never been exposed to tobacco smoke. She has never used smokeless tobacco. She reports that she does not drink alcohol and does not use drugs.    ROS Review of Systems  Constitutional: Negative.   HENT: Negative.    Eyes:  Negative for visual disturbance.  Respiratory:  Negative for shortness of breath.   Cardiovascular:  Negative for chest pain.  Gastrointestinal:  Negative for abdominal pain.  Musculoskeletal:  Positive for myalgias. Negative for arthralgias.    Objective:  BP 127/72   Pulse 71   Temp (!) 97.4 F (36.3 C)   Ht 5\' 6"  (1.676 m)   Wt 164 lb (74.4 kg)   SpO2 97%   BMI 26.47 kg/m   BP Readings from Last 3 Encounters:  06/30/23 127/72  03/31/23 (!) 140/70  12/18/22 112/61    Wt Readings from Last 3 Encounters:  06/30/23 164 lb (74.4 kg)  05/19/23 168 lb (76.2 kg)  03/31/23 168 lb 6.4 oz (76.4 kg)     Physical Exam Constitutional:      General: She is not in acute distress.    Appearance: She is well-developed.  Cardiovascular:     Rate and Rhythm: Normal rate and regular rhythm.  Pulmonary:     Breath sounds: Normal breath sounds.  Musculoskeletal:        General: Normal range of motion.  Skin:    General: Skin is warm and dry.  Neurological:     Mental Status: She is alert and oriented to person,  place, and time.       Assessment & Plan:   Nanine was seen today for medication refill.  Diagnoses and all orders for this visit:  Essential hypertension -     CBC with Differential/Platelet -     CMP14+EGFR  Restless legs syndrome (RLS) -     clonazePAM (KLONOPIN) 1 MG tablet; TAKE 1 TABLET AT BEDTIME FOR RESTLESS LEGS -     CBC with Differential/Platelet -     CMP14+EGFR  Dyslipidemia -     CBC with Differential/Platelet -     CMP14+EGFR -     Lipid panel  Depression, recurrent (HCC) -     sertraline (ZOLOFT) 50 MG  tablet; Take 0.5-1 tablets (25-50 mg total) by mouth daily.  Other orders -     atorvastatin (LIPITOR) 80 MG tablet; Take 1 tablet (80 mg total) by mouth daily. -     fenofibrate 160 MG tablet; Take 1 tablet (160 mg total) by mouth daily. FOR CHOLESTEROL AND  TRIGLYCERIDE       I have discontinued [address]ue [address]. Wessell's apixaban. I have also changed her fenofibrate. Additionally, I am having her maintain her b complex vitamins, HYDROcodone-acetaminophen, famotidine, Glycerin-Polysorbate 80 (REFRESH DRY EYE THERAPY OP), albuterol, diltiazem, clonazePAM, atorvastatin, and sertraline.  Allergies as of 06/30/2023       Reactions   Other Other (See Comments)   Oranges; juice or nuts    Tetracycline Rash, Other (See Comments)   Agitation        Medication List        Accurate as of June 30, 2023  8:59 AM. If you have any questions, ask your nurse or doctor.          STOP taking these medications    apixaban 5 MG Tabs tablet Commonly known as: ELIQUIS Stopped by: Equan Cogbill       TAKE these medications    albuterol 108 (90 Base) MCG/ACT inhaler Commonly known as: VENTOLIN HFA Inhale 2 puffs into the lungs every 6 (six) hours as needed for wheezing or shortness of breath.   atorvastatin 80 MG tablet Commonly known as: LIPITOR Take 1 tablet (80 mg total) by mouth daily. What changed: how much to take   b complex vitamins tablet Take 1 tablet by mouth daily.   clonazePAM 1 MG tablet Commonly known as: KLONOPIN TAKE 1 TABLET AT BEDTIME FOR RESTLESS LEGS   diltiazem 30 MG tablet Commonly known as: Cardizem Take 1 tablet (30 mg total) by mouth 2 (two) times daily.   famotidine 20 MG tablet Commonly known as: Pepcid Take 1 tablet (20 mg total) by mouth 2 (two) times daily.   fenofibrate 160 MG tablet Take 1 tablet (160 mg total) by mouth daily. FOR CHOLESTEROL AND  TRIGLYCERIDE What changed: See the new instructions. Changed by: Broadus John Aislyn Hayse    HYDROcodone-acetaminophen 10-325 MG tablet Commonly known as: NORCO Take 0.5 tablets by mouth 2 (two) times daily as needed for moderate pain. What changed: when to take this   REFRESH DRY EYE THERAPY OP Place 1-2 drops into both eyes at bedtime.   sertraline 50 MG tablet Commonly known as: Zoloft Take 0.5-1 tablets (25-50 mg total) by mouth daily. What changed: how much to take         Follow-up: Return in about 6 months (around 12/21/2023).  Mechele Claude, M.D.

## 2023-07-01 ENCOUNTER — Encounter: Payer: Self-pay | Admitting: Family Medicine

## 2023-07-01 LAB — CBC WITH DIFFERENTIAL/PLATELET
Basophils Absolute: 0.1 10*3/uL (ref 0.0–0.2)
Basos: 1 %
EOS (ABSOLUTE): 0.3 10*3/uL (ref 0.0–0.4)
Eos: 3 %
Hematocrit: 40.6 % (ref 34.0–46.6)
Hemoglobin: 13.1 g/dL (ref 11.1–15.9)
Immature Grans (Abs): 0 10*3/uL (ref 0.0–0.1)
Immature Granulocytes: 0 %
Lymphocytes Absolute: 3.2 10*3/uL — ABNORMAL HIGH (ref 0.7–3.1)
Lymphs: 38 %
MCH: 27.8 pg (ref 26.6–33.0)
MCHC: 32.3 g/dL (ref 31.5–35.7)
MCV: 86 fL (ref 79–97)
Monocytes Absolute: 0.5 10*3/uL (ref 0.1–0.9)
Monocytes: 6 %
Neutrophils Absolute: 4.3 10*3/uL (ref 1.4–7.0)
Neutrophils: 52 %
Platelets: 229 10*3/uL (ref 150–450)
RBC: 4.71 x10E6/uL (ref 3.77–5.28)
RDW: 13.1 % (ref 11.7–15.4)
WBC: 8.5 10*3/uL (ref 3.4–10.8)

## 2023-07-01 LAB — CMP14+EGFR
ALT: 10 [IU]/L (ref 0–32)
AST: 16 [IU]/L (ref 0–40)
Albumin: 4.1 g/dL (ref 3.7–4.7)
Alkaline Phosphatase: 107 [IU]/L (ref 44–121)
BUN/Creatinine Ratio: 14 (ref 12–28)
BUN: 10 mg/dL (ref 8–27)
Bilirubin Total: 0.7 mg/dL (ref 0.0–1.2)
CO2: 25 mmol/L (ref 20–29)
Calcium: 9.5 mg/dL (ref 8.7–10.3)
Chloride: 102 mmol/L (ref 96–106)
Creatinine, Ser: 0.69 mg/dL (ref 0.57–1.00)
Globulin, Total: 2.7 g/dL (ref 1.5–4.5)
Glucose: 92 mg/dL (ref 70–99)
Potassium: 3.7 mmol/L (ref 3.5–5.2)
Sodium: 142 mmol/L (ref 134–144)
Total Protein: 6.8 g/dL (ref 6.0–8.5)
eGFR: 87 mL/min/{1.73_m2} (ref >59)

## 2023-07-01 LAB — LIPID PANEL
Cholesterol, Total: 228 mg/dL — ABNORMAL HIGH (ref 100–199)
HDL: 33 mg/dL — ABNORMAL LOW (ref >39)
LDL CALC COMMENT:: 6.9 ratio — ABNORMAL HIGH (ref 0.0–4.4)
LDL Chol Calc (NIH): 132 mg/dL — ABNORMAL HIGH (ref 0–99)
Triglycerides: 352 mg/dL — ABNORMAL HIGH (ref 0–149)
VLDL Cholesterol Cal: 63 mg/dL — ABNORMAL HIGH (ref 5–40)

## 2023-07-02 ENCOUNTER — Ambulatory Visit: Payer: Medicare HMO | Admitting: Family Medicine

## 2023-07-03 ENCOUNTER — Telehealth: Payer: Self-pay | Admitting: Family Medicine

## 2023-07-03 NOTE — Telephone Encounter (Signed)
Copied from CRM [id number]. Topic: Clinical - Medication Question >> Jul 03, 2023  1:46 PM Yolanda T wrote: Reason for CRM: patient needs a copy of her medication to take to her dentist. She wants to pick it up asap. Please f/u with patient

## 2023-07-03 NOTE — Telephone Encounter (Signed)
Med list printed and put up front for patient to pick up - patient aware

## 2023-09-14 DIAGNOSIS — M48061 Spinal stenosis, lumbar region without neurogenic claudication: Secondary | ICD-10-CM | POA: Diagnosis not present

## 2023-09-14 DIAGNOSIS — Z79891 Long term (current) use of opiate analgesic: Secondary | ICD-10-CM | POA: Diagnosis not present

## 2023-09-14 DIAGNOSIS — M51362 Other intervertebral disc degeneration, lumbar region with discogenic back pain and lower extremity pain: Secondary | ICD-10-CM | POA: Diagnosis not present

## 2023-12-07 ENCOUNTER — Other Ambulatory Visit: Payer: Self-pay | Admitting: Family Medicine

## 2023-12-07 NOTE — Telephone Encounter (Signed)
 Copied from CRM [id number]. Topic: Clinical - Medication Refill >> Dec 07, 2023  1:35 PM Shardie S wrote: Medication: diltiazem  (CARDIZEM ) 30 MG tablet  Has the patient contacted their pharmacy? Yes (Agent: If no, request that the patient contact the pharmacy for the refill. If patient does not wish to contact the pharmacy document the reason why and proceed with request.) (Agent: If yes, when and what did the pharmacy advise?)  This is the patient's preferred pharmacy:  [facility] [place], [place] - [address] [place] [address] Phone: [phone] Fax: [phone]    Is this the correct pharmacy for this prescription? Yes If no, delete pharmacy and type the correct one.   Has the prescription been filled recently? No  Is the patient out of the medication? Yes  Has the patient been seen for an appointment in the last year OR does the patient have an upcoming appointment? Yes  Can we respond through MyChart? No  Agent: Please be advised that Rx refills may take up to 3 business days. We ask that you follow-up with your pharmacy.

## 2023-12-10 ENCOUNTER — Other Ambulatory Visit: Payer: Self-pay | Admitting: Family Medicine

## 2023-12-10 NOTE — Telephone Encounter (Unsigned)
 Copied from CRM [id number]. Topic: Clinical - Medication Refill >> Dec 10, 2023  4:34 PM Geneva B wrote: Medication: diltiazem  (CARDIZEM ) 30 MG tablet  Has the patient contacted their pharmacy? Yes (Agent: If no, request that the patient contact the pharmacy for the refill. If patient does not wish to contact the pharmacy document the reason why and proceed with request.) (Agent: If yes, when and what did the pharmacy advise?)  This is the patient's preferred pharmacy:  [facility] [place], [place] - [address] [place] [address] Phone: [phone] Fax: [phone]   Is this the correct pharmacy for this prescription? Yes If no, delete pharmacy and type the correct one.   Has the prescription been filled recently? Yes  Is the patient out of the medication? Yes  Has the patient been seen for an appointment in the last year OR does the patient have an upcoming appointment? Yes  Can we respond through MyChart? No  Agent: Please be advised that Rx refills may take up to 3 business days. We ask that you follow-up with your pharmacy.

## 2023-12-12 ENCOUNTER — Other Ambulatory Visit: Payer: Self-pay | Admitting: Cardiovascular Disease

## 2023-12-21 ENCOUNTER — Ambulatory Visit: Payer: Medicare HMO | Admitting: Family Medicine

## 2023-12-24 ENCOUNTER — Encounter: Payer: Self-pay | Admitting: Family Medicine

## 2023-12-24 ENCOUNTER — Ambulatory Visit (INDEPENDENT_AMBULATORY_CARE_PROVIDER_SITE_OTHER): Admitting: Family Medicine

## 2023-12-24 VITALS — BP 125/65 | HR 65 | Temp 98.5°F | Ht 66.0 in | Wt 167.0 lb

## 2023-12-24 DIAGNOSIS — F339 Major depressive disorder, recurrent, unspecified: Secondary | ICD-10-CM

## 2023-12-24 DIAGNOSIS — E785 Hyperlipidemia, unspecified: Secondary | ICD-10-CM

## 2023-12-24 DIAGNOSIS — G2581 Restless legs syndrome: Secondary | ICD-10-CM | POA: Diagnosis not present

## 2023-12-24 MED ORDER — SERTRALINE HCL 50 MG PO TABS: 25.0000 mg | ORAL_TABLET | Freq: Every day | ORAL | 2 refills | Status: AC

## 2023-12-24 MED ORDER — CLONAZEPAM 1 MG PO TABS: ORAL_TABLET | ORAL | 5 refills | Status: DC

## 2023-12-24 NOTE — Progress Notes (Signed)
   Subjective:  Patient ID: Stephanie Gonzalez, female    DOB: [birth year]  Age: 83 y.o. MRN: [id number]  CC: Medical Management of Chronic Issues (No concerns at this time. )   HPI [name] presents for restless legs responds well to clonazepam . Requip didn't help. Uses clonaz. For years now. Prefers no change.   PHQ unavailable at the time of dictation.  However patient does report increasing sensation of disinterest dysphoria feeling down and hopeless and depressed.  Would like to resume her sertraline .     06/30/2023    8:10 AM 05/19/2023   11:34 AM 12/18/2022   11:32 AM  Depression screen PHQ 2/9  Decreased Interest 0 0 0  Down, Depressed, Hopeless 0 0 0  PHQ - 2 Score 0 0 0    History Courteney has a past medical history of Allergy, Arthritis, ASTHMA (10/06/2007), Bronchial asthma, BURSITIS, RIGHT HIP (02/04/2010), DIABETES MELLITUS, TYPE II (11/01/2009), Family history of adverse reaction to anesthesia, H/O vertigo, History of kidney stones, HYPERLIPIDEMIA (10/06/2007), HYPERTENSION (10/06/2007), Pneumonia, and Restless leg syndrome.   She has a past surgical history that includes Appendectomy; Cholecystectomy; Abdominal hysterectomy; Tubal ligation; Tonsillectomy; Knee arthroscopy (Right); Spinal cord stimulator insertion (N/A, 07/13/2014); Colonoscopy; Cardiac catheterization (1992); and Spine surgery.   Her family history includes Alcoholism in her father; Alzheimer's disease in her mother; Cancer in her father.She reports that she has never smoked. She has never been exposed to tobacco smoke. She has never used smokeless tobacco. She reports that she does not drink alcohol and does not use drugs.    ROS Review of Systems  Constitutional: Negative.   HENT: Negative.    Eyes:  Negative for visual disturbance.  Respiratory:  Negative for shortness of breath.   Cardiovascular:  Negative for chest pain.  Gastrointestinal:  Negative for abdominal pain.  Musculoskeletal:  Negative for  arthralgias.    Objective:  BP 125/65   Pulse 65   Temp 98.5 F (36.9 C)   Ht 5' 6 (1.676 m)   Wt 167 lb (75.8 kg)   SpO2 96%   BMI 26.95 kg/m   BP Readings from Last 3 Encounters:  12/24/23 125/65  06/30/23 127/72  03/31/23 (!) 140/70    Wt Readings from Last 3 Encounters:  12/24/23 167 lb (75.8 kg)  06/30/23 164 lb (74.4 kg)  05/19/23 168 lb (76.2 kg)     Physical Exam Constitutional:      General: She is not in acute distress.    Appearance: She is well-developed.  Cardiovascular:     Rate and Rhythm: Normal rate and regular rhythm.  Pulmonary:     Breath sounds: Normal breath sounds.  Musculoskeletal:        General: Normal range of motion.  Skin:    General: Skin is warm and dry.  Neurological:     Mental Status: She is alert and oriented to person, place, and time.      Assessment & Plan:  Restless legs syndrome (RLS) -     clonazePAM ; TAKE 1 TABLET AT BEDTIME FOR RESTLESS LEGS  Dispense: 30 tablet; Refill: 5  Depression, recurrent (HCC) -     Sertraline  HCl; Take 0.5-1 tablets (25-50 mg total) by mouth daily.  Dispense: 90 tablet; Refill: 2  Dyslipidemia  Meds reviewed.  She should continue as is.  She is taking both fenofibrate  and atorvastatin .   Follow-up: Return in about 6 months (around 06/25/2024).  Butler Der, M.D.

## 2023-12-27 ENCOUNTER — Encounter: Payer: Self-pay | Admitting: Family Medicine

## 2024-01-14 DIAGNOSIS — M5416 Radiculopathy, lumbar region: Secondary | ICD-10-CM | POA: Diagnosis not present

## 2024-01-14 DIAGNOSIS — Z79891 Long term (current) use of opiate analgesic: Secondary | ICD-10-CM | POA: Diagnosis not present

## 2024-01-14 DIAGNOSIS — G894 Chronic pain syndrome: Secondary | ICD-10-CM | POA: Diagnosis not present

## 2024-01-14 DIAGNOSIS — Z79899 Other long term (current) drug therapy: Secondary | ICD-10-CM | POA: Diagnosis not present

## 2024-01-14 DIAGNOSIS — Z5181 Encounter for therapeutic drug level monitoring: Secondary | ICD-10-CM | POA: Diagnosis not present

## 2024-01-14 DIAGNOSIS — M48061 Spinal stenosis, lumbar region without neurogenic claudication: Secondary | ICD-10-CM | POA: Diagnosis not present

## 2024-02-09 ENCOUNTER — Ambulatory Visit: Payer: Self-pay | Admitting: *Deleted

## 2024-02-09 NOTE — Telephone Encounter (Signed)
 Pt aware no appts today. Pt has appt tomorrow

## 2024-02-09 NOTE — Telephone Encounter (Signed)
  FYI Only or Action Required?: FYI only for provider.  Patient was last seen in primary care on 12/24/2023 by Stephanie Lowers, MD.  Called Nurse Triage reporting Knee Pain.  Symptoms began several days ago.  Interventions attempted: Rest, hydration, or home remedies.  Symptoms are: gradually worsening.  Triage Disposition: See PCP When Office is Open (Within 3 Days)  Patient/caregiver understands and will follow disposition?: Yes  Please advise if any available appt today due to patient using walker to be able to ambulate.    Copied from CRM #[id number]. Topic: Clinical - Red Word Triage >> Feb 09, 2024 10:34 AM Tiffany S wrote: Red Word that prompted transfer to Nurse Triage: Patient had fall Friday and knee pain not able to walk Reason for Disposition  [1] MODERATE pain (e.g., interferes with normal activities, limping) AND [2] present > 3 days  Answer Assessment - Initial Assessment Questions No available appt today . Patient will not go to [facility]. Appt scheduled tomorrow with other provider. None available with PCP until Thursday. Patient able to use walker for mobility. Reports can not bear weight on right leg. Please advise if appt today      1. LOCATION and RADIATION: Where is the pain located?      Right knee  2. QUALITY: What does the pain feel like?  (e.g., sharp, dull, aching, burning)     Burning pain under neath knee posterior knee area 3. SEVERITY: How bad is the pain? What does it keep you from doing?   (Scale 1-10; or mild, moderate, severe)     9/10 4. ONSET: When did the pain start? Does it come and go, or is it there all the time?     Friday , fell when standing and right knee gave way . 5. RECURRENT: Have you had this pain before? If Yes, ask: When, and what happened then?     No  6. SETTING: Has there been any recent work, exercise or other activity that involved that part of the body?      Fall  7. AGGRAVATING FACTORS: What makes the knee  pain worse? (e.g., walking, climbing stairs, running)     Walking  8. ASSOCIATED SYMPTOMS: Is there any swelling or redness of the knee?     No  9. OTHER SYMPTOMS: Do you have any other symptoms? (e.g., calf pain, chest pain, difficulty breathing, fever)     Right knee pain back of knee, burning sensation . Severe pain with walking and requires using walker.  10. PREGNANCY: Is there any chance you are pregnant? When was your last menstrual period?       na  Protocols used: Knee Pain-A-AH

## 2024-02-10 ENCOUNTER — Ambulatory Visit: Admitting: Family Medicine

## 2024-02-10 ENCOUNTER — Ambulatory Visit: Payer: Self-pay | Admitting: Family Medicine

## 2024-02-10 ENCOUNTER — Ambulatory Visit (INDEPENDENT_AMBULATORY_CARE_PROVIDER_SITE_OTHER)

## 2024-02-10 ENCOUNTER — Encounter: Payer: Self-pay | Admitting: Family Medicine

## 2024-02-10 VITALS — BP 124/66 | HR 70 | Temp 98.4°F | Ht 66.0 in | Wt 168.0 lb

## 2024-02-10 DIAGNOSIS — Z23 Encounter for immunization: Secondary | ICD-10-CM

## 2024-02-10 DIAGNOSIS — M1711 Unilateral primary osteoarthritis, right knee: Secondary | ICD-10-CM | POA: Diagnosis not present

## 2024-02-10 DIAGNOSIS — M25562 Pain in left knee: Secondary | ICD-10-CM | POA: Diagnosis not present

## 2024-02-10 DIAGNOSIS — M25561 Pain in right knee: Secondary | ICD-10-CM | POA: Diagnosis not present

## 2024-02-10 NOTE — Progress Notes (Signed)
   Acute Office Visit  Subjective:     Patient ID: Stephanie Gonzalez, female    DOB: 1940-07-04, 83 y.o.   MRN: [id number]  Chief Complaint  Patient presents with   Knee Pain    HPI  History of Present Illness   Stephanie Gonzalez is an 83 year old female who presents with right knee pain after a fall.  Right knee pain - Intermittent pain located in the posterior aspect of the right knee - Pain onset following a near fall last Thursday night when her right leg suddenly felt absent while getting out of bed - Pain worsens with bending or twisting of the knee - No swelling, numbness, tingling, or burning pain in the knee - Pain has decreased since Friday - Pressure on the leg causes pain - She did not land directly on her knee during the fall. She caught herself prior to following.  - She believes the pain is unrelated to her kneecap  Fall event - Stephanie Gonzalez last Thursday night after right leg suddenly felt absent while getting out of bed - Caught herself on the door frame   Functional status and mobility - Uses a walker at home due to pain with pressure on the leg  Pain management - Management includes knee brace, alternating hot and cold treatments, muscle relaxers, and half a hydrocodone  before bed       ROS As per HPI.      Objective:    BP 124/66   Pulse 70   Temp 98.4 F (36.9 C) (Temporal)   Ht 5' 6 (1.676 m)   Wt 168 lb (76.2 kg)   SpO2 98%   BMI 27.12 kg/m    Physical Exam Vitals and nursing note reviewed.  Constitutional:      General: She is not in acute distress.    Appearance: She is not ill-appearing, toxic-appearing or diaphoretic.  Musculoskeletal:     Right knee: No swelling or bony tenderness. Normal range of motion. No tenderness. Normal patellar mobility.     Right lower leg: No edema.     Left lower leg: No edema.  Skin:    General: Skin is warm and dry.  Neurological:     Mental Status: She is alert.     Gait: Gait abnormal (antalgic gait,  using cane).  Psychiatric:        Mood and Affect: Mood normal.        Behavior: Behavior normal.     No results found for any visits on 02/10/24.      Assessment & Plan:   Assessment and Plan    Stephanie Gonzalez was seen today for knee pain.  Diagnoses and all orders for this visit:  Acute pain of left knee -     DG Knee 1-2 Views Right  Encounter for immunization -     Flu vaccine HIGH DOSE PF(Fluzone Trivalent)    Right posterior knee pain Acute pain post near-fall. No fracture suspected. - Order x-ray of right knee to assess structural changes or tendinitis. - Continue rest, ice, heat, knee brace. - Use walker for support at home. - Review x-ray results and follow up with ortho if symptoms worsen or do not improve.      Stephanie CHRISTELLA Search, FNP

## 2024-02-10 NOTE — Telephone Encounter (Signed)
 Tried calling to give xray results but no answer and no option to leave a message.

## 2024-02-11 NOTE — Progress Notes (Signed)
 Pt r/c.

## 2024-02-12 ENCOUNTER — Telehealth: Payer: Self-pay | Admitting: Family Medicine

## 2024-02-12 NOTE — Telephone Encounter (Signed)
 Pt made aware of arthritis results. Rice therapy advised and ortho if no improvement. Pt is established with ortho and will call them if needed.

## 2024-02-12 NOTE — Telephone Encounter (Signed)
 Copied from CRM #[id number]. Topic: Clinical - Medical Advice >> Feb 12, 2024 12:27 PM Donna BRAVO wrote: Reason for CRM: patient calling stating they have not told her what to do for her knee patient want to know what will help her, something to do at home or another doctor.  Patient would like to speak with a nurse.

## 2024-02-17 DIAGNOSIS — M25561 Pain in right knee: Secondary | ICD-10-CM | POA: Diagnosis not present

## 2024-03-29 NOTE — Progress Notes (Signed)
 Cardiology Office Note:    Date:  04/05/2024   ID:  Stephanie Gonzalez, DOB April 29, 1941, MRN [id number]  PCP:  Zollie Lowers, MD  Cardiologist:  Diannah SHAUNNA Maywood, MD     Referring MD: Zollie Lowers, MD   Chief Complaint: follow-up of atrial fibrillation   History of Present Illness:    Stephanie Gonzalez is a 83 y.o. female with a history of minimal non-obstructive CAD noted on coronary CTA in 03/2022, paroxysmal atrial fibrillation on Eliquis , sinus bradycardia, hypertension, hyperlipidemia, type 2 diabetes mellitus, vertigo, asthma, restless leg syndrome, and chronic back pain who presents today for routine follow-up.   Patient was first seen by Cardiology during admission in 02/2022 for new onset atrial fibrillation with RVR after presenting with exertional dyspnea, neck/ jaw pain, and fatigue. Patient converted back to sinus rhythm with IV Diltiazem  and symptoms resolved with this. Echo showed LVEF of 60-65% with no regional wall motion abnormalities, normal RV, mild MR, and mildly elevated PASP. She was discharged on Metoprolol  and Eliquis . Outpatient coronary CTA in 03/2022 showed a coronary calcium  score of 105 (49th percentile for age and sex) with only minimal non-obstructive CAD. Metoprolol  was ultimately stopped due to bradycardia and persistent fatigue. She was subsequently started on Diltiazem  and has tolerated this better.  Monitor in 09/2022 showed predominantly mild sinus bradycardia with an average heart rate of 56 bpm as well as occasional PACs and several brief episodes of non-sustained ectopic atrial tachycardia. However, there was no evidence of severe bradycardia, high-grade AV block or atrial fibrillation.   She was last seen by Dr. Mallipeddi in 03/2023 at which time she was doing well from a cardiac standpoint.   Patient presents today for follow-up.  She reports a couple of spells where she gets a sudden tightness in her jaw/gum, weakness/shaking, and  lightheadedness/dizziness.  These episodes only last for few seconds and then resolve.  They occur at rest.  She denies any real palpitations but states hide the same symptoms she had when she was first diagnosed with atrial fibrillation.  She denies any other cardiac symptoms.  She denies any chest/ jaw pain or dizziness outside these episodes.  She states she has only had 2 of these episodes since her last office visit in 03/2023 and last episode was about 1 week ago.  Her daughter witnessed this episode and encouraged her to come and see us .  She states she is only been taking her diltiazem  once a day until recently when she read the bottle and saw that it was supposed to be twice a day.  She is now taking it twice a day.  She is not on anticoagulation anymore.  She states she was only on this for about 4 days and then had significant bleeding.  She describes profuse bleeding from her gums and states it destroyed he teeth.  She also describes hematuria and hematochezia with this.  She stopped the Eliquis  and symptoms are resolved.  She is taking Aspirin 81 mg daily though.  ROS: No chest pain, shortness of breath, orthopnea, PND, lower extremity edema, palpitations, syncope.    EKGs/Labs/Other Studies Reviewed:    The following studies were reviewed:  Echocardiogram 02/28/2022: Impressions:  1. Left ventricular ejection fraction, by estimation, is 60 to 65%. The  left ventricle has normal function. The left ventricle has no regional  wall motion abnormalities. There is mild left ventricular hypertrophy.  Left ventricular diastolic parameters  are indeterminate.   2. Right ventricular systolic  function is normal. The right ventricular  size is normal. There is mildly elevated pulmonary artery systolic  pressure.   3. The mitral valve is normal in structure. Mild mitral valve  regurgitation. No evidence of mitral stenosis.   4. The aortic valve is normal in structure. Aortic valve  regurgitation is  not visualized. Aortic valve sclerosis is present, with no evidence of  aortic valve stenosis.   5. The inferior vena cava is normal in size with greater than 50%  respiratory variability, suggesting right atrial pressure of 3 mmHg.  _______________   Coronary CTA 03/27/2022: Impression: 1. Minimal mixed non-obstructive CAD, CADRADS = 1. 2. Coronary calcium  score of 105. This was 49th percentile for age and sex matched control. 3. Normal coronary origin with left dominance. 4. Consider non-coronary causes of shoulder pain. 5. Posterior mitral annular calcification. 6. Aortic atherosclerosis. _______________  Monitor 09/08/2022 to 09/21/2022:   Predominant rhythm is mild sinus bradycardia.  The average heart rate is 56 bpm.  There is no evidence of severe bradycardia cardia and there peers to be a poor frequent heart rate response to activity and normal circadian rhythm variation.   There are occasional premature atrial contractions and there are several brief episodes of nonsustained ectopic atrial tachycardia.  There was no evidence of atrial fibrillation.   There is no meaningful ventricular arrhythmia.   There is no evidence of sinus pauses or high-grade AV block.   Abnormal arrhythmia monitor due to mild baseline sinus bradycardia as well as occasional PACs and brief bursts of nonsustained ectopic atrial tachycardia.  There is no evidence of severe bradycardia, high-grade AV block or atrial fibrillation.   EKG:  EKG ordered today.   EKG Interpretation Date/Time:  Tuesday April 05 2024 13:53:32 EDT Ventricular Rate:  57 PR Interval:  146 QRS Duration:  128 QT Interval:  436 QTC Calculation: 424 R Axis:   -73  Text Interpretation: Sinus bradycardia Left axis deviation Left bundle branch block When compared with ECG of 31-Mar-2023 15:20, No significant change was found Confirmed by Jadine Patient [phone]) on 04/05/2024 2:01:38 PM    Recent Labs: 06/30/2023:  ALT 10; BUN 10; Creatinine, Ser 0.69; Hemoglobin 13.1; Platelets 229; Potassium 3.7; Sodium 142  Recent Lipid Panel    Component Value Date/Time   CHOL 228 (H) 06/30/2023 0858   TRIG 352 (H) 06/30/2023 0858   HDL 33 (L) 06/30/2023 0858   CHOLHDL 6.9 (H) 06/30/2023 0858   CHOLHDL 5 08/04/2017 0859   VLDL 46.6 (H) 08/04/2017 0859   LDLCALC 132 (H) 06/30/2023 0858   LDLDIRECT 95.0 08/04/2017 0859    Physical Exam:    Vital Signs: BP 117/69 (BP Location: Left Arm, Patient Position: Sitting, Cuff Size: Large)   Pulse 61   Resp 16   Ht 5' 6 (1.676 m)   Wt 161 lb 12.8 oz (73.4 kg)   SpO2 96%   BMI 26.12 kg/m     Wt Readings from Last 3 Encounters:  04/05/24 161 lb 12.8 oz (73.4 kg)  02/10/24 168 lb (76.2 kg)  12/24/23 167 lb (75.8 kg)     General: 83 y.o. female in no acute distress. HEENT: Normocephalic and atraumatic. Sclera clear.  Neck: Supple. No carotid bruits. No JVD. Heart: Mildly bradycardic with normal rhythm. Distinct S1 and S2. No murmurs, gallops, or rubs.  Lungs: No increased work of breathing. Clear to ausculation bilaterally. No wheezes, rhonchi, or rales.  Extremities: No lower extremity edema.  Skin: Warm and dry. Neuro:  No focal deficits. Psych: Normal affect. Responds appropriately.  Assessment:    1. PAF (paroxysmal atrial fibrillation) (HCC)   2. Sinus bradycardia   3. Non-obstructive CAD   4. Primary hypertension   5. Hyperlipidemia, unspecified hyperlipidemia type   6. Type 2 diabetes mellitus with complication, without long-term current use of insulin  (HCC)     Plan:    Paroxysmal Atrial Fibrillation Sinus Bradycardia Initially diagnosed in 02/2022 but converted with IV Diltiazem . Echo at that time showed normal LV function. Monitor in 09/2022 showed predominantly mild sinus bradycardia with an average heart rate of 56 bpm as well as occasional PACs and several brief episodes of non-sustained ectopic atrial tachycardia. However, there was no  evidence of severe bradycardia, high-grade AV block or atrial fibrillation.  - She reports a couple of very rare episodes of jaw tightness, weakness, and lightheadedness dizziness which are the symptoms she had when she was first diagnosed with atrial fibrillation.  - EKG today shows sinus bradycardia with rate of 57 bpm.  - Will update labs today including CBC, BMET, Mag, TSH.  - Continue Diltiazem  30mg  twice daily.  - She is not on any anticoagulation.  She states she took Eliquis  for 4-5 days but had significant bleeding with this including bleeding from her gums, hematuria, hematochezia.  Symptoms stopped when she stopped the Eliquis .  CHA2DS2-VASc score = 6 (CAD, HTN, DM, age x2, female) indicating she has a 9.7% annual risk of having a stroke.  HADS-BLED score = 2 indicating her risk of having a major bleed is 4.1%. We discussed these risks and she does not want to resume Eliquis . Will repeat 30-day Event Monitor to assess burden of atrial fibrillation. If she is having significant atrial fibrillation, discussed possibly referring to EP for consideration of Watchman device. Continue Aspirin 81mg  daily since she is not on anticoagulation.  Non-Obstructive CAD Coronary CTA in 03/2022 showed a  coronary calcium  score of 105 (49th percentile for age and sex) with only minimal non-obstructive CAD.  - No angina. - Continue aspirin and statin.   Hypertension BP well controlled.  - Continue Diltiazem  30mg  twice daily.    Hyperlipidemia Most recent lipid panel in 06/2023: Total Cholesterol  228, Triglycreides 353, HDL 33, LDL 132.  - She is currently on Lipitor 80mg  daily and Fenofibrate  160mg  daily but she has not been taking these regularly.  - Advised patient to start taking Lipitor and Fenofibrate  daily and then we will repeat lipid panel and LFTs in 3 months.    Type 2 Diabetes Mellitus History of type 2 diabetes mellitus listed in chart but not on any medications.  - Hemoglobin A1c 5.2% in  12/2022. - Management per PCP.  Disposition: Follow up in 4 months.   Signed, Aline FORBES Door, PA-C  04/05/2024 2:44 PM    [facility]

## 2024-04-05 ENCOUNTER — Ambulatory Visit: Attending: Student | Admitting: Student

## 2024-04-05 ENCOUNTER — Encounter: Payer: Self-pay | Admitting: Student

## 2024-04-05 VITALS — BP 117/69 | HR 61 | Resp 16 | Ht 66.0 in | Wt 161.8 lb

## 2024-04-05 DIAGNOSIS — I10 Essential (primary) hypertension: Secondary | ICD-10-CM

## 2024-04-05 DIAGNOSIS — I2510 Atherosclerotic heart disease of native coronary artery without angina pectoris: Secondary | ICD-10-CM | POA: Diagnosis not present

## 2024-04-05 DIAGNOSIS — E118 Type 2 diabetes mellitus with unspecified complications: Secondary | ICD-10-CM | POA: Diagnosis not present

## 2024-04-05 DIAGNOSIS — E785 Hyperlipidemia, unspecified: Secondary | ICD-10-CM

## 2024-04-05 DIAGNOSIS — I480 Paroxysmal atrial fibrillation: Secondary | ICD-10-CM

## 2024-04-05 DIAGNOSIS — R001 Bradycardia, unspecified: Secondary | ICD-10-CM

## 2024-04-05 NOTE — Patient Instructions (Signed)
 Thank you for choosing [facility]!     Medication Instructions:  No medication changes were made during today's visit.  *If you need a refill on your cardiac medications before your next appointment, please call your pharmacy*   Lab Work: CBC, BMET, TSH, MAG  Return in 3 months for fasting labs.....................LIPID, LFT's If you have labs (blood work) drawn today and your tests are completely normal, you will receive your results only by: MyChart Message (if you have MyChart) OR A paper copy in the mail If you have any lab test that is abnormal or we need to change your treatment, we will call you to review the results.   Testing/Procedures: Your physician has recommended that you wear an event monitor. Event monitors are medical devices that record the heart's electrical activity. Doctors most often us  these monitors to diagnose arrhythmias. Arrhythmias are problems with the speed or rhythm of the heartbeat. The monitor is a small, portable device. You can wear one while you do your normal daily activities. This is usually used to diagnose what is causing palpitations/syncope (passing out).   Your next appointment:   4 month(s)   Provider:   Almarie Crate, NP    Follow-Up: At [facility], you and your health needs are our priority.  As part of our continuing mission to provide you with exceptional heart care, we have created designated Provider Care Teams.  These Care Teams include your primary Cardiologist (physician) and Advanced Practice Providers (APPs -  Physician Assistants and Nurse Practitioners) who all work together to provide you with the care you need, when you need it. We recommend signing up for the patient portal called MyChart.  Sign up information is provided on this After Visit Summary.  MyChart is used to connect with patients for Virtual Visits (Telemedicine).  Patients are able to view lab/test results, encounter notes, upcoming  appointments, etc.  Non-urgent messages can be sent to your provider as well.   To learn more about what you can do with MyChart, go to [email or web address].

## 2024-04-06 LAB — BASIC METABOLIC PANEL WITH GFR
BUN/Creatinine Ratio: 25 (ref 12–28)
BUN: 18 mg/dL (ref 8–27)
CO2: 26 mmol/L (ref 20–29)
Calcium: 9.7 mg/dL (ref 8.7–10.3)
Chloride: 102 mmol/L (ref 96–106)
Creatinine, Ser: 0.73 mg/dL (ref 0.57–1.00)
Glucose: 88 mg/dL (ref 70–99)
Potassium: 4 mmol/L (ref 3.5–5.2)
Sodium: 140 mmol/L (ref 134–144)
eGFR: 82 mL/min/1.73 (ref >59)

## 2024-04-06 LAB — CBC
Hematocrit: 40.2 % (ref 34.0–46.6)
Hemoglobin: 12.9 g/dL (ref 11.1–15.9)
MCH: 28.6 pg (ref 26.6–33.0)
MCHC: 32.1 g/dL (ref 31.5–35.7)
MCV: 89 fL (ref 79–97)
Platelets: 228 x10E3/uL (ref 150–450)
RBC: 4.51 x10E6/uL (ref 3.77–5.28)
RDW: 13.2 % (ref 11.7–15.4)
WBC: 9.1 x10E3/uL (ref 3.4–10.8)

## 2024-04-06 LAB — TSH: TSH: 0.772 u[IU]/mL (ref 0.450–4.500)

## 2024-04-06 LAB — MAGNESIUM: Magnesium: 2.1 mg/dL (ref 1.6–2.3)

## 2024-04-07 ENCOUNTER — Ambulatory Visit: Payer: Self-pay | Admitting: Student

## 2024-04-08 NOTE — Progress Notes (Signed)
Left vm for pt to cb for lab results

## 2024-04-11 ENCOUNTER — Telehealth: Payer: Self-pay | Admitting: Internal Medicine

## 2024-04-11 NOTE — Telephone Encounter (Signed)
 Advised to come to [facility] on Wednesday, 04/13/2024 @12 :50 pm to get assistance with heart monitor placement. Verbalized understanding.

## 2024-04-11 NOTE — Telephone Encounter (Signed)
 Pt received a heart monitor in the mail and cannot figure out how to set it up. She would like a callback about a nurses appt to help with the set up. Please advise.

## 2024-04-16 DIAGNOSIS — I480 Paroxysmal atrial fibrillation: Secondary | ICD-10-CM | POA: Diagnosis not present

## 2024-04-29 NOTE — Progress Notes (Signed)
 Called pts daughter Katherine back, cleared up all confusion. Sent link to daughters cell so moms mychart info can be shared between all 3 daughters. Patient does not have cell.

## 2024-05-09 DIAGNOSIS — M48061 Spinal stenosis, lumbar region without neurogenic claudication: Secondary | ICD-10-CM | POA: Diagnosis not present

## 2024-05-09 DIAGNOSIS — M5416 Radiculopathy, lumbar region: Secondary | ICD-10-CM | POA: Diagnosis not present

## 2024-05-09 DIAGNOSIS — K5903 Drug induced constipation: Secondary | ICD-10-CM | POA: Diagnosis not present

## 2024-05-09 DIAGNOSIS — T402X5A Adverse effect of other opioids, initial encounter: Secondary | ICD-10-CM | POA: Diagnosis not present

## 2024-05-09 DIAGNOSIS — M51362 Other intervertebral disc degeneration, lumbar region with discogenic back pain and lower extremity pain: Secondary | ICD-10-CM | POA: Diagnosis not present

## 2024-05-09 DIAGNOSIS — G8929 Other chronic pain: Secondary | ICD-10-CM | POA: Diagnosis not present

## 2024-05-18 ENCOUNTER — Ambulatory Visit: Attending: Student

## 2024-05-18 DIAGNOSIS — I480 Paroxysmal atrial fibrillation: Secondary | ICD-10-CM

## 2024-06-10 ENCOUNTER — Telehealth: Payer: Self-pay | Admitting: Student

## 2024-06-10 NOTE — Telephone Encounter (Signed)
 Patient calling for results to her monitor.

## 2024-06-10 NOTE — Telephone Encounter (Signed)
 I am still waiting on the final read from the MD, but will let patient know when I receive these.   Thank you!

## 2024-06-10 NOTE — Telephone Encounter (Signed)
 Called and spoke to patient and made patient aware that we do not have results of cardiac event monitor. Made patient aware this will be sent to provider for review. Pt verbalized an understanding

## 2024-06-20 ENCOUNTER — Ambulatory Visit: Payer: Self-pay

## 2024-06-20 NOTE — Telephone Encounter (Signed)
" °  Reason for Disposition  General information question, no triage required and triager able to answer question  Answer Assessment - Initial Assessment Questions 1. REASON FOR CALL: What is the main reason for your call? or How can I best help you?  Patient states that she already spoke to someone at the office and has an appt tomorrow.  Protocols used: Information Only Call - No Triage-A-AH  "

## 2024-06-20 NOTE — Telephone Encounter (Signed)
 Appt made

## 2024-06-21 ENCOUNTER — Other Ambulatory Visit: Payer: Self-pay | Admitting: Family Medicine

## 2024-06-21 ENCOUNTER — Ambulatory Visit: Admitting: Nurse Practitioner

## 2024-06-21 ENCOUNTER — Encounter: Payer: Self-pay | Admitting: Nurse Practitioner

## 2024-06-21 VITALS — BP 122/65 | HR 62 | Temp 96.5°F | Ht 66.0 in | Wt 159.0 lb

## 2024-06-21 DIAGNOSIS — R300 Dysuria: Secondary | ICD-10-CM

## 2024-06-21 DIAGNOSIS — G2581 Restless legs syndrome: Secondary | ICD-10-CM

## 2024-06-21 LAB — URINALYSIS, ROUTINE W REFLEX MICROSCOPIC
Bilirubin, UA: NEGATIVE
Glucose, UA: NEGATIVE
Ketones, UA: NEGATIVE
Nitrite, UA: NEGATIVE
Protein,UA: NEGATIVE
RBC, UA: NEGATIVE
Specific Gravity, UA: 1.01 (ref 1.005–1.030)
Urobilinogen, Ur: 0.2 mg/dL (ref 0.2–1.0)
pH, UA: 6 (ref 5.0–7.5)

## 2024-06-21 LAB — MICROSCOPIC EXAMINATION
RBC, Urine: NONE SEEN /HPF (ref 0–2)
Renal Epithel, UA: NONE SEEN /HPF
Yeast, UA: NONE SEEN

## 2024-06-21 NOTE — Patient Instructions (Signed)

## 2024-06-21 NOTE — Progress Notes (Signed)
" ° °  Subjective:    Patient ID: Stephanie Gonzalez, female    DOB: [birth year], 84 y.o.   MRN: [id number]   Chief Complaint: dysuria  Was constipated 2 weeks ago. Was able to get cleaned out but now she is having dysuria.   Dysuria  This is a new problem. The current episode started in the past 7 days. The problem occurs intermittently. The problem has been waxing and waning. The quality of the pain is described as burning. The pain is at a severity of 2/10. The pain is mild. There has been no fever. She is Not sexually active. There is No history of pyelonephritis. Associated symptoms include frequency, hesitancy and urgency. She has tried nothing for the symptoms. The treatment provided mild relief.    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       Objective:   Physical Exam Constitutional:      Appearance: Normal appearance.  Cardiovascular:     Rate and Rhythm: Normal rate and regular rhythm.     Heart sounds: Normal heart sounds.  Pulmonary:     Effort: Pulmonary effort is normal.     Breath sounds: Normal breath sounds.  Skin:    General: Skin is warm.  Neurological:     General: No focal deficit present.     Mental Status: She is alert and oriented to person, place, and time.  Psychiatric:         Mood and Affect: Mood normal.        Behavior: Behavior normal.     BP 122/65   Pulse 62   Temp (!) 96.5 F (35.8 C) (Temporal)   Ht 5' 6 (1.676 m)   Wt 159 lb (72.1 kg)   SpO2 96%   BMI 25.66 kg/m   Urine clear      Assessment & Plan:   Stephanie Gonzalez in today with chief complaint of Dysuria   1. Dysuria (Primary) Probably came from constipation - Urinalysis, Routine w reflex microscopic - Urine Culture  2. Constipation Force fluids Miralax daily Increase fiber in diet RTO prn  The above assessment and management plan was discussed with the patient. The patient verbalized understanding of and has agreed to the management plan. Patient is aware to call the clinic if symptoms persist or worsen. Patient is aware when to return to the clinic for a follow-up visit. Patient educated on when it is appropriate to go to the emergency department.   Mary-Margaret Gladis, FNP   "

## 2024-06-21 NOTE — Telephone Encounter (Unsigned)
 Copied from CRM #[id number]. Topic: Clinical - Medication Refill >> Jun 21, 2024  3:49 PM Willma R wrote: Medication: clonazePAM  (KLONOPIN ) 1 MG table  Has the patient contacted their pharmacy? Yes, call dr  This is the patient's preferred pharmacy:  [facility] [place], [place] - [address] [place] [address] Phone: [phone] Fax: [phone]  Is this the correct pharmacy for this prescription? Yes If no, delete pharmacy and type the correct one.   Has the prescription been filled recently? No  Is the patient out of the medication? No  Has the patient been seen for an appointment in the last year OR does the patient have an upcoming appointment? Yes  Can we respond through MyChart? No  Agent: Please be advised that Rx refills may take up to 3 business days. We ask that you follow-up with your pharmacy.

## 2024-06-22 ENCOUNTER — Ambulatory Visit: Payer: Self-pay | Admitting: Nurse Practitioner

## 2024-06-23 LAB — URINE CULTURE

## 2024-06-24 NOTE — Telephone Encounter (Signed)
 LMTCB w/Daughter to make an appt w/[facility] for med refill.

## 2024-06-27 ENCOUNTER — Ambulatory Visit: Payer: Self-pay | Admitting: Family Medicine

## 2024-06-27 ENCOUNTER — Telehealth: Payer: Self-pay | Admitting: Family Medicine

## 2024-06-27 ENCOUNTER — Telehealth: Payer: Self-pay | Admitting: Internal Medicine

## 2024-06-27 ENCOUNTER — Other Ambulatory Visit: Payer: Self-pay | Admitting: Family Medicine

## 2024-06-27 ENCOUNTER — Encounter: Payer: Self-pay | Admitting: Nurse Practitioner

## 2024-06-27 DIAGNOSIS — G2581 Restless legs syndrome: Secondary | ICD-10-CM

## 2024-06-27 NOTE — Telephone Encounter (Unsigned)
 Copied from CRM [id number]. Topic: Clinical - Medication Question >> Jun 27, 2024 11:17 AM Stephanie Gonzalez wrote: Reason for CRM: Patient called in and stated she  Would like to know why Dr. Zollie cannot use recent visit with Stephanie Gonzalez and fill script clonazePAM  (KLONOPIN ) 1 MG tablet. Please call after 3.

## 2024-06-27 NOTE — Telephone Encounter (Signed)
 Patient is requesting for a call back to discuss heart monitor

## 2024-06-27 NOTE — Telephone Encounter (Signed)
 error

## 2024-06-27 NOTE — Telephone Encounter (Signed)
 Anitra, can you please call this patient? I am still waiting on the results from her monitor if that is the question.  Thank you!

## 2024-06-27 NOTE — Telephone Encounter (Signed)
 Copied from CRM [id number]. Topic: Clinical - Prescription Issue >> Jun 24, 2024  4:59 PM Wess RAMAN wrote: Reason for CRM: Patient stated she already had a recent office visit and needs her clonazePAM  (KLONOPIN ) 1 MG tablet  Callback #: [id number]  Pharmacy: [facility] [place], [place] - [address] [place] [address] Phone: [phone] Fax: [phone] Hours: Not open 24 hours

## 2024-06-28 ENCOUNTER — Telehealth: Payer: Self-pay | Admitting: Family Medicine

## 2024-06-28 NOTE — Telephone Encounter (Signed)
 Copied from CRM [id number]. Topic: Clinical - Medication Question >> Jun 27, 2024 11:17 AM Deaijah H wrote: Reason for CRM: Patient called in and stated she  Would like to know why Dr. Zollie cannot use recent visit with Ronal Poet and fill script clonazePAM  (KLONOPIN ) 1 MG tablet. Please call after 3. >> Jun 28, 2024 11:36 AM Montie POUR wrote: Please call Ms. Dettmann about the order to refill clonazePAM  (KLONOPIN ) 1 MG tablet  Her number is [phone]. She only has 2 pills left.  This is in her chart: 06/21/24 1556 Reorder Vicci Ellouise HERO, RN To Nmizm:[id number]  06/27/24 1115 Reorder Ina Dorothyann BIRCH, LPN To Nmizm:[phone]   This has not been sent to her pharmacy per chart. Please check on order to refill.

## 2024-06-28 NOTE — Telephone Encounter (Signed)
 Pt scheduled and not happy about it. LS

## 2024-06-28 NOTE — Telephone Encounter (Signed)
 Generally the patient has to see the PCP for refills of any controled substance. Since I was out when she saw Ronal Quant she could have refilled the medication at the visit, but according to the note documentation, the visit was for a UTI - not for restless legs or refills of controlled substance. She will need to see me since clonazepam  is a contolled substance and wasn't addressed at the visit with Ronal Quant

## 2024-06-29 ENCOUNTER — Encounter: Payer: Self-pay | Admitting: Family Medicine

## 2024-06-29 ENCOUNTER — Ambulatory Visit: Admitting: Family Medicine

## 2024-06-29 VITALS — BP 148/56 | HR 54 | Temp 97.8°F | Ht 66.0 in | Wt 156.0 lb

## 2024-06-29 DIAGNOSIS — I10 Essential (primary) hypertension: Secondary | ICD-10-CM | POA: Diagnosis not present

## 2024-06-29 DIAGNOSIS — G2581 Restless legs syndrome: Secondary | ICD-10-CM

## 2024-06-29 MED ORDER — CLONAZEPAM 1 MG PO TABS: ORAL_TABLET | ORAL | 5 refills | Status: AC

## 2024-06-29 NOTE — Progress Notes (Signed)
 "  Subjective:  Patient ID: Stephanie Gonzalez, female    DOB: September 04, 1940  Age: 84 y.o. MRN: [id number]  CC: Medication Refill (Pended/Pt is not happy that she had to come in for the refill because she is getting over a sickness and just saw MMM.  )   HPI  Discussed the use of AI scribe software for clinical note transcription with the patient, who gave verbal consent to proceed.  History of Present Illness Stephanie Gonzalez is an 84 year old female who presents with persistent weakness and low energy following a recent illness.  She has been experiencing persistent weakness and low energy since a recent illness, which she describes as a 'stomp virus' and a suspected urinary tract infection that was not confirmed. She feels lifeless and exhausted, lacking the energy to perform daily activities.  She is currently taking diltiazem , which she was previously taking twice daily but is now taking once daily. She also takes clonazepam  at a dose of 1 mg for restless legs syndrome, which she has been on for years. This dose is effective for her symptoms. She uses hydrocodone , typically a half tablet, to manage pain associated with physical activities such as vacuuming, unloading the dishwasher, and climbing stairs.  She recalls an instance where she was without her clonazepam  for a night and managed to sleep well, attributing it to the medication's presence in her system. She expresses concern about the availability of her medications and the need for regular follow-up to ensure continued access.     presents for  follow-up of hypertension. Patient has no history of headache chest pain or shortness of breath or recent cough. Patient also denies symptoms of TIA such as focal numbness or weakness. Patient denies side effects from medication. States taking it regularly.       06/29/2024    2:28 PM 06/21/2024    2:41 PM 02/10/2024    3:04 PM  Depression screen PHQ 2/9  Decreased Interest 0 0 0  Down, Depressed,  Hopeless 0 0 0  PHQ - 2 Score 0 0 0  Altered sleeping 0 0 0  Tired, decreased energy 0 0 0  Change in appetite 0 0 0  Feeling bad or failure about yourself  0 0 0  Trouble concentrating 0 0 0  Moving slowly or fidgety/restless 0 0 0  Suicidal thoughts 0 0 0  PHQ-9 Score 0 0 0   Difficult doing work/chores Not difficult at all Not difficult at all Not difficult at all     Data saved with a previous flowsheet row definition    History Ekam has a past medical history of Allergy, Arthritis, ASTHMA (10/06/2007), Bronchial asthma, BURSITIS, RIGHT HIP (02/04/2010), DIABETES MELLITUS, TYPE II (11/01/2009), Family history of adverse reaction to anesthesia, H/O vertigo, History of kidney stones, HYPERLIPIDEMIA (10/06/2007), HYPERTENSION (10/06/2007), Pneumonia, and Restless leg syndrome.   She has a past surgical history that includes Appendectomy; Cholecystectomy; Abdominal hysterectomy; Tubal ligation; Tonsillectomy; Knee arthroscopy (Right); Spinal cord stimulator insertion (N/A, 07/13/2014); Colonoscopy; Cardiac catheterization (1992); and Spine surgery.   Her family history includes Alcoholism in her father; Alzheimer's disease in her mother; Cancer in her father.She reports that she has never smoked. She has never been exposed to tobacco smoke. She has never used smokeless tobacco. She reports that she does not drink alcohol and does not use drugs.    ROS Review of Systems  Constitutional: Negative.   HENT:  Negative for congestion.   Eyes:  Negative for visual  disturbance.  Respiratory:  Negative for shortness of breath.   Cardiovascular:  Negative for chest pain.  Gastrointestinal:  Negative for abdominal pain, constipation, diarrhea, nausea and vomiting.  Genitourinary:  Negative for difficulty urinating.  Musculoskeletal:  Negative for arthralgias and myalgias.  Neurological:  Negative for headaches.  Psychiatric/Behavioral:  Negative for sleep disturbance.     Objective:  BP (!)  148/56   Pulse (!) 54   Temp 97.8 F (36.6 C)   Ht 5' 6 (1.676 m)   Wt 156 lb (70.8 kg)   SpO2 95%   BMI 25.18 kg/m   BP Readings from Last 3 Encounters:  06/29/24 (!) 148/56  06/21/24 122/65  04/05/24 117/69    Wt Readings from Last 3 Encounters:  06/29/24 156 lb (70.8 kg)  06/21/24 159 lb (72.1 kg)  04/05/24 161 lb 12.8 oz (73.4 kg)     Physical Exam Physical Exam GENERAL: Alert, cooperative, well developed, no acute distress HEENT: Normocephalic, normal oropharynx, moist mucous membranes CHEST: Clear to auscultation bilaterally, No wheezes, rhonchi, or crackles CARDIOVASCULAR: Normal heart rate and rhythm, S1 and S2 normal without murmurs ABDOMEN: Soft, non-tender, non-distended, without organomegaly, Normal bowel sounds EXTREMITIES: No cyanosis or edema NEUROLOGICAL: Cranial nerves grossly intact, Moves all extremities without gross motor or sensory deficit   Assessment & Plan:  Restless legs syndrome (RLS) -     clonazePAM ; TAKE 1 TABLET AT BEDTIME FOR RESTLESS LEGS  Dispense: 30 tablet; Refill: 5  Essential hypertension    Assessment and Plan Assessment & Plan Restless legs syndrome   Chronic restless legs syndrome is effectively managed with clonazepam  1 mg at bedtime, providing significant relief. The risks of clonazepam , particularly the potential for overdose when combined with hydrocodone , were discussed. It is crucial not to exceed the prescribed dose to avoid respiratory depression and overdose. Although alternative medications like ropinirole or Sinemet were considered, the current regimen remains effective, and changes are not recommended due to long-term use. Continue clonazepam  1 mg at bedtime. A follow-up appointment is scheduled in approximately six months to renew the prescription.       Follow-up: Return in about 6 months (around 12/22/2024).  Butler Der, M.D. "

## 2024-06-29 NOTE — Telephone Encounter (Signed)
 Stew w/ [facility] called RE: RF request on Pt's Klonopin . Pt stated that she had a visit yesterday 06/28/24. Informed him that it was today 06/09/24 for this afternoon. He will wait to see if it is sent over.

## 2024-06-29 NOTE — Telephone Encounter (Signed)
 She understands that the monitor has not been interpreted yet.

## 2024-07-19 ENCOUNTER — Ambulatory Visit: Admitting: Nurse Practitioner

## 2024-09-09 ENCOUNTER — Ambulatory Visit: Payer: Self-pay | Admitting: Nurse Practitioner

## 2024-12-21 ENCOUNTER — Ambulatory Visit: Admitting: Family Medicine
